# Patient Record
Sex: Female | Born: 1955
Health system: Southern US, Community
[De-identification: ages and names within clinical notes are randomized; demographics above are authoritative.]

## PROBLEM LIST (undated history)

## (undated) DIAGNOSIS — M1990 Unspecified osteoarthritis, unspecified site: Secondary | ICD-10-CM

## (undated) DIAGNOSIS — F419 Anxiety disorder, unspecified: Secondary | ICD-10-CM

## (undated) DIAGNOSIS — K5792 Diverticulitis of intestine, part unspecified, without perforation or abscess without bleeding: Secondary | ICD-10-CM

## (undated) DIAGNOSIS — E079 Disorder of thyroid, unspecified: Secondary | ICD-10-CM

## (undated) DIAGNOSIS — I10 Essential (primary) hypertension: Secondary | ICD-10-CM

## (undated) DIAGNOSIS — T7840XA Allergy, unspecified, initial encounter: Secondary | ICD-10-CM

## (undated) DIAGNOSIS — E7800 Pure hypercholesterolemia, unspecified: Secondary | ICD-10-CM

## (undated) DIAGNOSIS — Z87891 Personal history of nicotine dependence: Secondary | ICD-10-CM

## (undated) DIAGNOSIS — E669 Obesity, unspecified: Secondary | ICD-10-CM

## (undated) HISTORY — DX: Diverticulitis of intestine, part unspecified, without perforation or abscess without bleeding: K57.92

## (undated) HISTORY — DX: Pure hypercholesterolemia, unspecified: E78.00

## (undated) HISTORY — DX: Obesity, unspecified: E66.9

## (undated) HISTORY — DX: Unspecified osteoarthritis, unspecified site: M19.90

## (undated) HISTORY — DX: Personal history of nicotine dependence: Z87.891

## (undated) HISTORY — PX: WISDOM TOOTH EXTRACTION: SHX21

## (undated) HISTORY — DX: Allergy, unspecified, initial encounter: T78.40XA

## (undated) HISTORY — PX: COLONOSCOPY: SHX174

## (undated) HISTORY — DX: Disorder of thyroid, unspecified: E07.9

## (undated) HISTORY — DX: Essential (primary) hypertension: I10

---

## 2001-01-20 ENCOUNTER — Encounter: Admission: RE | Admit: 2001-01-20 | Discharge: 2001-01-20 | Payer: Self-pay | Admitting: *Deleted

## 2001-01-20 ENCOUNTER — Encounter: Payer: Self-pay | Admitting: *Deleted

## 2001-05-18 ENCOUNTER — Other Ambulatory Visit: Admission: RE | Admit: 2001-05-18 | Discharge: 2001-05-18 | Payer: Self-pay | Admitting: *Deleted

## 2002-05-24 ENCOUNTER — Other Ambulatory Visit: Admission: RE | Admit: 2002-05-24 | Discharge: 2002-05-24 | Payer: Self-pay | Admitting: *Deleted

## 2003-05-27 ENCOUNTER — Encounter: Admission: RE | Admit: 2003-05-27 | Discharge: 2003-05-27 | Payer: Self-pay

## 2004-08-07 ENCOUNTER — Other Ambulatory Visit: Admission: RE | Admit: 2004-08-07 | Discharge: 2004-08-07 | Payer: Self-pay | Admitting: Family Medicine

## 2005-08-13 ENCOUNTER — Other Ambulatory Visit: Admission: RE | Admit: 2005-08-13 | Discharge: 2005-08-13 | Payer: Self-pay | Admitting: Family Medicine

## 2006-04-07 ENCOUNTER — Encounter: Admission: RE | Admit: 2006-04-07 | Discharge: 2006-04-07 | Payer: Self-pay | Admitting: Family Medicine

## 2006-06-05 ENCOUNTER — Ambulatory Visit: Payer: Self-pay | Admitting: Family Medicine

## 2006-06-30 ENCOUNTER — Ambulatory Visit: Payer: Self-pay | Admitting: Gastroenterology

## 2006-07-14 ENCOUNTER — Ambulatory Visit: Payer: Self-pay | Admitting: Gastroenterology

## 2006-07-14 ENCOUNTER — Encounter: Payer: Self-pay | Admitting: Gastroenterology

## 2006-08-14 ENCOUNTER — Ambulatory Visit: Payer: Self-pay | Admitting: Family Medicine

## 2006-08-20 ENCOUNTER — Ambulatory Visit: Payer: Self-pay | Admitting: Family Medicine

## 2006-12-30 ENCOUNTER — Ambulatory Visit: Payer: Self-pay | Admitting: Gastroenterology

## 2007-01-28 ENCOUNTER — Ambulatory Visit: Payer: Self-pay | Admitting: Family Medicine

## 2007-02-03 ENCOUNTER — Ambulatory Visit: Payer: Self-pay | Admitting: Gastroenterology

## 2007-02-03 ENCOUNTER — Encounter (INDEPENDENT_AMBULATORY_CARE_PROVIDER_SITE_OTHER): Payer: Self-pay | Admitting: Specialist

## 2007-02-03 LAB — HM COLONOSCOPY: HM Colonoscopy: NORMAL

## 2007-09-22 ENCOUNTER — Ambulatory Visit: Payer: Self-pay | Admitting: Family Medicine

## 2008-05-23 LAB — HM PAP SMEAR: HM Pap smear: NORMAL

## 2008-06-08 ENCOUNTER — Ambulatory Visit: Payer: Self-pay | Admitting: Family Medicine

## 2008-07-07 ENCOUNTER — Ambulatory Visit: Payer: Self-pay | Admitting: Family Medicine

## 2008-07-12 ENCOUNTER — Ambulatory Visit: Payer: Self-pay | Admitting: Family Medicine

## 2008-07-14 ENCOUNTER — Ambulatory Visit: Payer: Self-pay | Admitting: Family Medicine

## 2008-08-15 ENCOUNTER — Ambulatory Visit: Payer: Self-pay | Admitting: Family Medicine

## 2008-08-17 ENCOUNTER — Ambulatory Visit: Payer: Self-pay | Admitting: Family Medicine

## 2008-08-19 ENCOUNTER — Ambulatory Visit: Payer: Self-pay | Admitting: Family Medicine

## 2008-10-03 ENCOUNTER — Encounter: Payer: Self-pay | Admitting: Family Medicine

## 2008-10-03 ENCOUNTER — Ambulatory Visit: Payer: Self-pay | Admitting: Family Medicine

## 2008-10-03 ENCOUNTER — Other Ambulatory Visit: Admission: RE | Admit: 2008-10-03 | Discharge: 2008-10-03 | Payer: Self-pay | Admitting: Family Medicine

## 2008-10-07 ENCOUNTER — Ambulatory Visit: Payer: Self-pay | Admitting: Family Medicine

## 2009-02-20 ENCOUNTER — Ambulatory Visit: Payer: Self-pay | Admitting: Family Medicine

## 2009-06-23 ENCOUNTER — Ambulatory Visit: Payer: Self-pay | Admitting: Family Medicine

## 2009-06-26 ENCOUNTER — Ambulatory Visit: Payer: Self-pay | Admitting: Family Medicine

## 2009-10-10 ENCOUNTER — Ambulatory Visit: Payer: Self-pay | Admitting: Family Medicine

## 2010-04-19 ENCOUNTER — Ambulatory Visit: Payer: Self-pay | Admitting: Family Medicine

## 2010-10-24 ENCOUNTER — Ambulatory Visit: Payer: Self-pay | Admitting: Family Medicine

## 2010-10-24 DIAGNOSIS — Z0000 Encounter for general adult medical examination without abnormal findings: Secondary | ICD-10-CM

## 2010-10-24 DIAGNOSIS — M1990 Unspecified osteoarthritis, unspecified site: Secondary | ICD-10-CM

## 2010-10-24 DIAGNOSIS — L719 Rosacea, unspecified: Secondary | ICD-10-CM

## 2010-10-24 DIAGNOSIS — E785 Hyperlipidemia, unspecified: Secondary | ICD-10-CM

## 2010-10-24 DIAGNOSIS — E039 Hypothyroidism, unspecified: Secondary | ICD-10-CM

## 2010-10-24 DIAGNOSIS — J309 Allergic rhinitis, unspecified: Secondary | ICD-10-CM

## 2010-11-18 HISTORY — PX: KNEE ARTHROSCOPY: SHX127

## 2011-04-16 LAB — HM MAMMOGRAPHY: HM Mammogram: NEGATIVE

## 2011-05-23 ENCOUNTER — Encounter: Payer: Self-pay | Admitting: Family Medicine

## 2011-07-31 ENCOUNTER — Encounter: Payer: Self-pay | Admitting: Family Medicine

## 2011-07-31 ENCOUNTER — Ambulatory Visit (INDEPENDENT_AMBULATORY_CARE_PROVIDER_SITE_OTHER): Payer: BC Managed Care – PPO | Admitting: Family Medicine

## 2011-07-31 DIAGNOSIS — Z23 Encounter for immunization: Secondary | ICD-10-CM

## 2011-07-31 DIAGNOSIS — R079 Chest pain, unspecified: Secondary | ICD-10-CM

## 2011-07-31 NOTE — Progress Notes (Signed)
  Subjective:    Patient ID: Paula Cline, female    DOB: November 04, 1956, 55 y.o.   MRN: [id number]  HPI She woke up this morning with a light pressure sensation underneath her left breast that has been intermittent. No shortness of breath, nausea, diaphoresis, weakness ,DOE. She did not eat breakfast. She does not smoke. Family history significant for grandfather having an MI in his early 8s. She is now involved in an exercise program and has been doing this for 2 and half months with no difficulty   Review of Systems     Objective:   Physical Exam alert and in no distress. Tympanic membranes and canals are normal. Throat is clear. Tonsils are normal. Neck is supple without adenopathy or thyromegaly. Cardiac exam shows a regular sinus rhythm without murmurs or gallops. Lungs are clear to auscultation. EKG shows no acute changes        Assessment & Plan:   1. Chest pain  PR ELECTROCARDIOGRAM, COMPLETE, CBC with Differential, Comp Met (CMET), Lipid panel   Continue with exercise program and call if any concerns about chest pain or shortness of breath

## 2011-07-31 NOTE — Patient Instructions (Signed)
Continue with your exercise program and if you run into difficulty with chest pain, weakness or sweating with exercise let me knowhest pain shortness of breath

## 2011-08-01 LAB — COMPREHENSIVE METABOLIC PANEL
AST: 18 U/L (ref 0–37)
Albumin: 4.2 g/dL (ref 3.5–5.2)
Alkaline Phosphatase: 72 U/L (ref 39–117)
BUN: 21 mg/dL (ref 6–23)
Potassium: 4.4 mEq/L (ref 3.5–5.3)
Sodium: 138 mEq/L (ref 135–145)

## 2011-08-01 LAB — CBC WITH DIFFERENTIAL/PLATELET
Basophils Absolute: 0.1 10*3/uL (ref 0.0–0.1)
Basophils Relative: 1 % (ref 0–1)
MCHC: 31.9 g/dL (ref 30.0–36.0)
Monocytes Absolute: 0.4 10*3/uL (ref 0.1–1.0)
Neutro Abs: 5.9 10*3/uL (ref 1.7–7.7)
Neutrophils Relative %: 69 % (ref 43–77)
RDW: 14.4 % (ref 11.5–15.5)

## 2011-08-01 LAB — LIPID PANEL
Cholesterol: 229 mg/dL — ABNORMAL HIGH (ref 0–200)
HDL: 53 mg/dL
LDL Cholesterol: 160 mg/dL — ABNORMAL HIGH (ref 0–99)
Total CHOL/HDL Ratio: 4.3 ratio
Triglycerides: 82 mg/dL
VLDL: 16 mg/dL (ref 0–40)

## 2011-08-05 ENCOUNTER — Telehealth: Payer: Self-pay

## 2011-08-05 NOTE — Telephone Encounter (Signed)
Pt informed of labs and mailed diet info 

## 2011-10-28 ENCOUNTER — Other Ambulatory Visit (HOSPITAL_COMMUNITY)
Admission: RE | Admit: 2011-10-28 | Discharge: 2011-10-28 | Disposition: A | Discharge status: Home / Self Care | Payer: BC Managed Care – PPO | Source: Ambulatory Visit | Attending: Family Medicine | Admitting: Family Medicine

## 2011-10-28 ENCOUNTER — Encounter: Payer: Self-pay | Admitting: Family Medicine

## 2011-10-28 ENCOUNTER — Ambulatory Visit (INDEPENDENT_AMBULATORY_CARE_PROVIDER_SITE_OTHER): Payer: BC Managed Care – PPO | Admitting: Family Medicine

## 2011-10-28 DIAGNOSIS — M1990 Unspecified osteoarthritis, unspecified site: Secondary | ICD-10-CM

## 2011-10-28 DIAGNOSIS — M129 Arthropathy, unspecified: Secondary | ICD-10-CM

## 2011-10-28 DIAGNOSIS — Z79899 Other long term (current) drug therapy: Secondary | ICD-10-CM

## 2011-10-28 DIAGNOSIS — Z0000 Encounter for general adult medical examination without abnormal findings: Secondary | ICD-10-CM

## 2011-10-28 DIAGNOSIS — Z01419 Encounter for gynecological examination (general) (routine) without abnormal findings: Secondary | ICD-10-CM | POA: Insufficient documentation

## 2011-10-28 DIAGNOSIS — E669 Obesity, unspecified: Secondary | ICD-10-CM | POA: Insufficient documentation

## 2011-10-28 DIAGNOSIS — E039 Hypothyroidism, unspecified: Secondary | ICD-10-CM

## 2011-10-28 DIAGNOSIS — J309 Allergic rhinitis, unspecified: Secondary | ICD-10-CM

## 2011-10-28 DIAGNOSIS — E785 Hyperlipidemia, unspecified: Secondary | ICD-10-CM

## 2011-10-28 LAB — POCT URINALYSIS DIPSTICK
Bilirubin, UA: NEGATIVE
Glucose, UA: NEGATIVE
Ketones, UA: NEGATIVE
Leukocytes, UA: NEGATIVE
Protein, UA: NEGATIVE
Spec Grav, UA: 1.015

## 2011-10-28 MED ORDER — METRONIDAZOLE 1 % EX GEL: 1.0000 "application " | Freq: Every day | CUTANEOUS | Status: DC

## 2011-10-28 MED ORDER — DOXYCYCLINE HYCLATE 100 MG PO CPEP: 100.0000 mg | ORAL_CAPSULE | Freq: Two times a day (BID) | ORAL | Status: DC

## 2011-10-28 NOTE — Patient Instructions (Signed)
Continue with your active lifestyle as well a exercises. Also make some changes in your eating habits in regard to carbohydrates. Return here if you need a little tuneup.

## 2011-10-28 NOTE — Progress Notes (Signed)
  Subjective:    Patient ID: Paula Cline, female    DOB: 25-Apr-1956, 55 y.o.   MRN: [id number]  HPI She is here for a complete examination. She recently had knee surgery and is now involved a water aerobics program to help with her weight reduction. She continues on her thyroid medication. She does have underlying allergies however these are under good control. She does recognize the need to make weight reduction a priority in her life.   Review of Systems Negative except as above    Objective:   Physical Exam BP 130/88  Pulse 77  Ht 5' 2.5" (1.588 m)  Wt 260 lb (117.935 kg)  BMI 46.80 kg/m2  General Appearance:    Alert, cooperative, no distress, appears stated age  Head:    Normocephalic, without obvious abnormality, atraumatic  Eyes:    PERRL, conjunctiva/corneas clear, EOM's intact, fundi    benign  Ears:    Normal TM's and external ear canals  Nose:   Nares normal, mucosa normal, no drainage or sinus   tenderness  Throat:   Lips, mucosa, and tongue normal; teeth and gums normal  Neck:   Supple, no lymphadenopathy;  thyroid:  no   enlargement/tenderness/nodules; no carotid   bruit or JVD  Back:    Spine nontender, no curvature, ROM normal, no CVA     tenderness  Lungs:     Clear to auscultation bilaterally without wheezes, rales or     ronchi; respirations unlabored  Chest Wall:    No tenderness or deformity   Heart:    Regular rate and rhythm, S1 and S2 normal, no murmur, rub   or gallop  Breast Exam:    No tenderness, masses, or nipple discharge or inversion.      No axillary lymphadenopathy  Abdomen:     Soft, non-tender, nondistended, normoactive bowel sounds,    no masses, no hepatosplenomegaly  Genitalia:    Normal external genitalia without lesions.  BUS and vagina normal; cervix without lesions, or cervical motion tenderness. No abnormal vaginal discharge.  Uterus and adnexa not enlarged, nontender, no masses.  Pap performed  Rectal:    Normal tone, no masses or  tenderness; guaiac negative stool  Extremities:   No clubbing, cyanosis or edema  Pulses:   2+ and symmetric all extremities  Skin:   Skin color, texture, turgor normal, no rashes or lesions  Lymph nodes:   Cervical, supraclavicular, and axillary nodes normal  Neurologic:   CNII-XII intact, normal strength, sensation and gait; reflexes 2+ and symmetric throughout          Psych:   Normal mood, affect, hygiene and grooming.          Assessment & Plan:   1. Routine general medical examination at a health care facility  POCT Urinalysis Dipstick, POCT Hemoccult (POC) Blood/Stool Test, Cytology - PAP  2. Hypothyroid  TSH  3. Obesity, Class III, BMI 40-49.9 (morbid obesity)    4. Allergic rhinitis, mild    5. Arthritis    6. Hyperlipidemia LDL goal < 100    7. Encounter for long-term (current) use of other medications  TSH   encouraged her to continue with her as an exercise program. Also discussed the need for her to cut down especially on carbohydrates. Appropriate blood work ordered.

## 2011-10-29 LAB — TSH: TSH: 10.727 u[IU]/mL — ABNORMAL HIGH (ref 0.350–4.500)

## 2011-10-30 ENCOUNTER — Encounter: Payer: Self-pay | Admitting: Family Medicine

## 2011-10-30 DIAGNOSIS — L719 Rosacea, unspecified: Secondary | ICD-10-CM | POA: Insufficient documentation

## 2011-10-30 NOTE — Progress Notes (Signed)
  Subjective:    Patient ID: Paula Cline, female    DOB: 07/28/1956, 55 y.o.   MRN: [id number]  HPI She is here for a complete examination. She has attempted to lose weight in the past however has had difficulty with this. She does have underlying allergies however she is not having much difficulty with them. She continues on medication for her as a show which is working quite well. Her arthritis does usually bother her however she does not take Naprosyn very often. She is not involved in a relationship. She did quit smoking several years ago.   Review of Systems Negative except as above    Objective:   Physical Exam There were no vitals taken for this visit.  General Appearance:    Alert, cooperative, no distress, appears stated age  Head:    Normocephalic, without obvious abnormality, atraumatic  Eyes:    PERRL, conjunctiva/corneas clear, EOM's intact, fundi    benign  Ears:    Normal TM's and external ear canals  Nose:   Nares normal, mucosa normal, no drainage or sinus   tenderness  Throat:   Lips, mucosa, and tongue normal; teeth and gums normal  Neck:   Supple, no lymphadenopathy;  thyroid:  no   enlargement/tenderness/nodules; no carotid   bruit or JVD  Back:    Spine nontender, no curvature, ROM normal, no CVA     tenderness  Lungs:     Clear to auscultation bilaterally without wheezes, rales or     ronchi; respirations unlabored  Chest Wall:    No tenderness or deformity   Heart:    Regular rate and rhythm, S1 and S2 normal, no murmur, rub   or gallop  Breast Exam:    No tenderness, masses, or nipple discharge or inversion.      No axillary lymphadenopathy  Abdomen:     Soft, non-tender, nondistended, normoactive bowel sounds,    no masses, no hepatosplenomegaly  Genitalia:    Normal external genitalia without lesions.  BUS and vagina normal; cervix without lesions, or cervical motion tenderness. No abnormal vaginal discharge.  Uterus and adnexa not enlarged, nontender, no  masses.  Pap performed  Rectal:    Normal tone, no masses or tenderness; guaiac negative stool  Extremities:   No clubbing, cyanosis or edema  Pulses:   2+ and symmetric all extremities  Skin:   Skin color, texture, turgor normal, no rashes or lesions  Lymph nodes:   Cervical, supraclavicular, and axillary nodes normal  Neurologic:   CNII-XII intact, normal strength, sensation and gait; reflexes 2+ and symmetric throughout          Psych:   Normal mood, affect, hygiene and grooming.          Assessment & Plan:   1. Allergic rhinitis, mild   2. Arthritis   3. Hyperlipidemia LDL goal < 100   4. Hypothyroid   5. Obesity, Class III, BMI 40-49.9 (morbid obesity)   6. Rosacea, acne    Her medications were renewed. Discussed diet and exercise with her in detail.

## 2011-10-31 ENCOUNTER — Telehealth: Payer: Self-pay | Admitting: Family Medicine

## 2011-10-31 NOTE — Telephone Encounter (Signed)
DONE

## 2011-12-19 ENCOUNTER — Other Ambulatory Visit: Payer: BC Managed Care – PPO

## 2011-12-19 DIAGNOSIS — R7989 Other specified abnormal findings of blood chemistry: Secondary | ICD-10-CM

## 2011-12-19 LAB — TSH: TSH: 3.579 u[IU]/mL (ref 0.350–4.500)

## 2012-03-16 ENCOUNTER — Other Ambulatory Visit: Payer: Self-pay | Admitting: Family Medicine

## 2012-09-17 ENCOUNTER — Encounter: Payer: Self-pay | Admitting: Family Medicine

## 2012-09-18 ENCOUNTER — Encounter: Payer: Self-pay | Admitting: Internal Medicine

## 2012-10-05 ENCOUNTER — Other Ambulatory Visit (INDEPENDENT_AMBULATORY_CARE_PROVIDER_SITE_OTHER): Payer: BC Managed Care – PPO

## 2012-10-05 DIAGNOSIS — Z23 Encounter for immunization: Secondary | ICD-10-CM

## 2012-10-22 ENCOUNTER — Encounter: Payer: Self-pay | Admitting: Internal Medicine

## 2012-11-05 ENCOUNTER — Ambulatory Visit (INDEPENDENT_AMBULATORY_CARE_PROVIDER_SITE_OTHER): Payer: BC Managed Care – PPO | Admitting: Family Medicine

## 2012-11-05 ENCOUNTER — Encounter: Payer: Self-pay | Admitting: Family Medicine

## 2012-11-05 VITALS — BP 130/80 | HR 76 | Ht 62.5 in | Wt 255.0 lb

## 2012-11-05 DIAGNOSIS — M1990 Unspecified osteoarthritis, unspecified site: Secondary | ICD-10-CM

## 2012-11-05 DIAGNOSIS — M129 Arthropathy, unspecified: Secondary | ICD-10-CM

## 2012-11-05 DIAGNOSIS — J309 Allergic rhinitis, unspecified: Secondary | ICD-10-CM

## 2012-11-05 DIAGNOSIS — E785 Hyperlipidemia, unspecified: Secondary | ICD-10-CM

## 2012-11-05 DIAGNOSIS — Z0000 Encounter for general adult medical examination without abnormal findings: Secondary | ICD-10-CM

## 2012-11-05 DIAGNOSIS — L719 Rosacea, unspecified: Secondary | ICD-10-CM

## 2012-11-05 DIAGNOSIS — E039 Hypothyroidism, unspecified: Secondary | ICD-10-CM

## 2012-11-05 LAB — COMPREHENSIVE METABOLIC PANEL
Alkaline Phosphatase: 78 U/L (ref 39–117)
BUN: 19 mg/dL (ref 6–23)
CO2: 26 mEq/L (ref 19–32)
Glucose, Bld: 96 mg/dL (ref 70–99)
Sodium: 139 mEq/L (ref 135–145)
Total Bilirubin: 0.5 mg/dL (ref 0.3–1.2)
Total Protein: 7 g/dL (ref 6.0–8.3)

## 2012-11-05 LAB — POCT URINALYSIS DIPSTICK
Glucose, UA: NEGATIVE
Leukocytes, UA: NEGATIVE
Nitrite, UA: NEGATIVE
Protein, UA: NEGATIVE
Spec Grav, UA: 1.02
Urobilinogen, UA: NEGATIVE

## 2012-11-05 LAB — CBC WITH DIFFERENTIAL/PLATELET
Basophils Relative: 1 % (ref 0–1)
Eosinophils Absolute: 0.2 10*3/uL (ref 0.0–0.7)
Eosinophils Relative: 2 % (ref 0–5)
HCT: 42.4 % (ref 36.0–46.0)
Hemoglobin: 14.2 g/dL (ref 12.0–15.0)
Lymphs Abs: 2.5 10*3/uL (ref 0.7–4.0)
MCH: 28.9 pg (ref 26.0–34.0)
MCHC: 33.5 g/dL (ref 30.0–36.0)
MCV: 86.2 fL (ref 78.0–100.0)
Monocytes Absolute: 0.5 10*3/uL (ref 0.1–1.0)
Monocytes Relative: 5 % (ref 3–12)
Neutrophils Relative %: 64 % (ref 43–77)
RBC: 4.92 MIL/uL (ref 3.87–5.11)

## 2012-11-05 LAB — LIPID PANEL
Cholesterol: 216 mg/dL — ABNORMAL HIGH (ref 0–200)
Triglycerides: 95 mg/dL (ref <150)
VLDL: 19 mg/dL (ref 0–40)

## 2012-11-05 LAB — TSH: TSH: 1.335 u[IU]/mL (ref 0.350–4.500)

## 2012-11-05 NOTE — Patient Instructions (Signed)
Paula Cline [phone] 

## 2012-11-05 NOTE — Progress Notes (Signed)
  Subjective:    Patient ID: Paula Cline, female    DOB: Mar 23, 1956, 56 y.o.   MRN: [id number]  HPI She is here for a medication check. She continues on MetroGel for her rosacea and notes that it does worsen with alcohol consumption. She does complain of arthritis especially in the knees and back. Her allergies seem to be under good control. She admits to having weight control issues and blames this  on stress-related heating. She has had difficulty with depression in the past and did get counseling which did help. She is considering getting involved in Weight [employer]. She did get nutrition counseling several years ago.   Review of Systems     Objective:   Physical Exam alert and in no distress. Tympanic membranes and canals are normal. Throat is clear. Tonsils are normal. Neck is supple without adenopathy or thyromegaly. Cardiac exam shows a regular sinus rhythm without murmurs or gallops. Lungs are clear to auscultation. DTRs are normal. Exam of her face shows no erythema or dryness.      Assessment & Plan:   1. Routine general medical examination at a health care facility  POCT Urinalysis Dipstick  2. Obesity, Class III, BMI 40-49.9 (morbid obesity)    3. Hypothyroid    4. Rosacea, acne    5. Hyperlipidemia LDL goal < 100    6. Arthritis    7. Allergic rhinitis, mild     I will do routine blood screening on her. Also recommend she get back involved in counseling to help deal with stress-related eating. At this time she is not interested in going back to the nutritionist.

## 2012-11-06 ENCOUNTER — Encounter: Payer: BC Managed Care – PPO | Admitting: Family Medicine

## 2012-11-09 NOTE — Progress Notes (Signed)
Quick Note:  Called pt cell informed pt and she verbalized understanding ______

## 2012-12-18 ENCOUNTER — Other Ambulatory Visit: Payer: Self-pay | Admitting: Family Medicine

## 2012-12-18 ENCOUNTER — Telehealth: Payer: Self-pay | Admitting: Family Medicine

## 2012-12-18 NOTE — Telephone Encounter (Signed)
DONE ERROR

## 2013-02-09 ENCOUNTER — Telehealth: Payer: Self-pay | Admitting: Family Medicine

## 2013-02-09 NOTE — Telephone Encounter (Signed)
lm

## 2013-03-17 ENCOUNTER — Other Ambulatory Visit: Payer: Self-pay | Admitting: Family Medicine

## 2013-04-03 ENCOUNTER — Other Ambulatory Visit: Payer: Self-pay | Admitting: Family Medicine

## 2013-07-15 ENCOUNTER — Telehealth: Payer: Self-pay | Admitting: Family Medicine

## 2013-07-15 NOTE — Telephone Encounter (Signed)
Is this okay to do nutrition referral for this patient? Please advise.

## 2013-07-16 ENCOUNTER — Other Ambulatory Visit: Payer: Self-pay | Admitting: Family Medicine

## 2013-07-16 NOTE — Telephone Encounter (Signed)
Referral is in the system. CLS

## 2013-07-16 NOTE — Telephone Encounter (Signed)
Give her the referral 

## 2013-08-13 ENCOUNTER — Encounter: Payer: Self-pay | Admitting: Family Medicine

## 2013-08-13 ENCOUNTER — Ambulatory Visit (INDEPENDENT_AMBULATORY_CARE_PROVIDER_SITE_OTHER): Payer: BC Managed Care – PPO | Admitting: Family Medicine

## 2013-08-13 VITALS — BP 140/84 | HR 72 | Ht 62.0 in | Wt 251.0 lb

## 2013-08-13 DIAGNOSIS — B009 Herpesviral infection, unspecified: Secondary | ICD-10-CM

## 2013-08-13 DIAGNOSIS — M25562 Pain in left knee: Secondary | ICD-10-CM

## 2013-08-13 DIAGNOSIS — Z23 Encounter for immunization: Secondary | ICD-10-CM

## 2013-08-13 DIAGNOSIS — B001 Herpesviral vesicular dermatitis: Secondary | ICD-10-CM | POA: Insufficient documentation

## 2013-08-13 DIAGNOSIS — M25569 Pain in unspecified knee: Secondary | ICD-10-CM

## 2013-08-13 MED ORDER — VALACYCLOVIR HCL 1 G PO TABS: ORAL_TABLET | ORAL | Status: DC

## 2013-08-13 NOTE — Patient Instructions (Addendum)
Use the naproxen twice daily with food for 7-10 days until pain has resolved.  Use ice if swelling after work.  You can also try heat, and/or alternating (use whichever works best).  You can continue riding the bike--back off on the tension if having any discomfort, and ensure that leg is almost fully extending (proper seat position).  If an activity makes your discomfort worse, then hold off for longer before restarting.   Baker's Cyst A Baker's cyst is a swelling that forms in the back of the knee. It is a sac-like structure. It is filled with the same fluid that is located in your knee. The fluid located in your knee is necessary because it lubricates the bones and cartilage. It allows them to move over each other more easily. CAUSES  When the knee becomes injured or has soreness (inflammation) present, more fluid forms in the knee. When this happens, the joint lining is pushed out behind the knee and forms the baker's cyst. This cyst may also be caused by inflammation from arthritic conditions and infections. DIAGNOSIS  A Baker's cyst is most often diagnosed with an ultrasound. This is a specialized picture (like an X-ray). It shows a picture by using sound waves. Sometimes a specialized x-ray called an MRI (magnetic resonance imaging) is used. This picks up other problems within a joint if an ultrasound alone cannot make the diagnosis. If the cyst came immediately following an injury, plain x-rays may be used to make a diagnosis. TREATMENT  The treatment depends on the cause of the cyst. But most of these cysts are caused by an inflammation. Anti-inflammatory medications and rest often will get rid of the problem. If the cyst is caused by an infection, medications (antibiotics) will be prescribed to help this. Take the medications as directed. Refer to Home Care Instructions, below, for additional treatment suggestions. HOME CARE INSTRUCTIONS   If the cyst was caused by an injury, for the first 24  hours, while lying down, keep the injured extremity elevated on 2 pillows.  For the first 24 hours while you are awake, apply ice bags (ice in a plastic bag with a towel around it to prevent frostbite to skin) 3-4 times per day for 15-20 minutes to the injured area. Then do as directed by your caregiver.  Only take over-the-counter or prescription medicines for pain, discomfort, or fever as directed by your caregiver. Persistent pain and inability to use the injured area for more than 2 to 3 days are warning signs indicating that you should see a caregiver for a follow-up visit as soon as possible. Persistent pain and swelling indicate that further evaluation, non-weight bearing (use of crutches as instructed), and/or further x-rays are needed. Make a follow-up appointment with your own caregiver. If conservative measures (rest, medications and inactivity) do not help the problem get better, sometimes surgery for removal of the cyst is needed. Reasons for this may be that the cyst is pressing on nerves and/or vessels and causing problems which cannot wait for improvement with conservative treatment. If the problem is caused by injuries to the cartilage in the knee, surgery is often needed for treatment of that problem. MAKE SURE YOU:   Understand these instructions.  Will watch your condition.  Will get help right away if you are not doing well or get worse. Document Released: 11/04/2005 Document Revised: 01/27/2012 Document Reviewed: 06/22/2008 [facility] Patient Information 2014 [place], [place].

## 2013-08-13 NOTE — Progress Notes (Signed)
Chief Complaint  Patient presents with  . Leg Pain    left leg pain, has been having pain behind her left knee(this is her good knee) had arthroscopic done on right knee in 2012. Heard "pop" last night and 2 more this morning.  Would like to know if you could call in a refill on her valtrex.    Patient has arthritis in her knees and neck.  She takes naproxen as needed for arthritis pain, less than twice per week.  She has been riding recumbent bike, she cleans houses for a living.  2 weeks ago she worked, rode bike, and then moved a lot of boxes, and woke up with swelling behind her left knee. She has a h/o Baker's cyst.  She rested, kept it elevated, had been doing better, until last night when she felt tightness in back of left knee.  When she extended the knee, she felt a pop.  Has some soreness/pain in posterior L knee.  She had two small pops this morning, just slight discomfort.  She hasn't taken any naproxen since last week.  Denies locking or giving way of knee.  She is trying hard to lose weight.  She has been exercising regularly.  Seeing psychologist re: to find out why she eats.  She is asking for refill on Valtrex; she is going to the beach and sometimes gets fever blisters from the sun.  Past Medical History  Diagnosis Date  . Arthritis   . Allergy     RHINITIS  . Obesity   . Diverticulitis   . Thyroid disease   . Hypertension   . Hypercholesteremia   . Former smoker    Past Surgical History  Procedure Laterality Date  . Knee arthroscopy  2012    Right knee  . Colonoscopy  2007/2008    Dr. Christella Hartigan   History   Social History  . Marital Status: Single    Spouse Name: N/A    Number of Children: N/A  . Years of Education: N/A   Occupational History  . Not on file.   Social History Main Topics  . Smoking status: Former Smoker    Types: Cigarettes    Quit date: 05/22/1993  . Smokeless tobacco: Never Used  . Alcohol Use: Yes     Comment: rare  . Drug Use: No  .  Sexual Activity: Not Currently   Other Topics Concern  . Not on file   Social History Narrative  . No narrative on file    Current outpatient prescriptions:naproxen (NAPROSYN) 500 MG tablet, TAKE 1 TABLET BY MOUTH TWICE A DAY, Disp: 60 tablet, Rfl: 1;  SYNTHROID 125 MCG tablet, TAKE 1 TABLET BY MOUTH ONCE DAILY, Disp: 90 tablet, Rfl: PRN;  valACYclovir (VALTREX) 1000 MG tablet, TAKE 2 TABLETS BY MOUTH TWICE A DAY FOR ONE DAY , THEN AS DIRECTED BY YOUR DOCTOR, Disp: 10 tablet, Rfl: PRN  Allergies  Allergen Reactions  . Hydrocodone Itching   ROS: denies fevers, URI symptoms, headache, chest pain, shortness of breath, bleeding/bruising, GI complaints.  +knee pain.  See HPI  PHYSICAL EXAM: BP 140/84  Pulse 72  Ht 5\' 2"  (1.575 m)  Wt 251 lb (113.853 kg)  BMI 45.9 kg/m2  Pleasant, talkative obese female in no distress L knee: No erythema, warmth or significant swelling noted.  Mild tenderness on deep palpation of popliteal fossa and over distal hamstring tendons.  No pain with hamstring stretch. Had pain with hamstring strength testing.  Strength was  intact Some crepitus noted at L knee.  FROM.  Negative Lachman, no pain with varus/valgus stress  ASSESSMENT/PLAN: Left knee pain - suspect mild hamstring strain, plus/minus small Baker's Cyst.  Treat with Naproxen  Need for prophylactic vaccination and inoculation against influenza - Plan: Flu Vaccine QUAD 36+ mos IM  Herpes labialis - Plan: valACYclovir (VALTREX) 1000 MG tablet   Hamstring strain, +/- baker's cyst Treat with naproxen--she already has.  Advised to use it twice daily with food for 7-10 days, if needed. Precautions reviewed. Discussed proper seat position for bike.  She may resume recumbent bike, turn down the tension if causes discomfort  F/u with Dr. Susann Givens for CPE in December, sooner prn

## 2013-09-27 ENCOUNTER — Encounter: Payer: Self-pay | Admitting: Family Medicine

## 2013-09-27 ENCOUNTER — Ambulatory Visit
Admission: RE | Admit: 2013-09-27 | Discharge: 2013-09-27 | Disposition: A | Discharge status: Home / Self Care | Payer: BC Managed Care – PPO | Source: Ambulatory Visit | Attending: Family Medicine | Admitting: Family Medicine

## 2013-09-27 ENCOUNTER — Ambulatory Visit (INDEPENDENT_AMBULATORY_CARE_PROVIDER_SITE_OTHER): Payer: BC Managed Care – PPO | Admitting: Family Medicine

## 2013-09-27 VITALS — BP 124/82 | HR 70 | Wt 242.0 lb

## 2013-09-27 DIAGNOSIS — M25562 Pain in left knee: Secondary | ICD-10-CM

## 2013-09-27 DIAGNOSIS — M25569 Pain in unspecified knee: Secondary | ICD-10-CM

## 2013-09-27 NOTE — Progress Notes (Signed)
  Subjective:    Patient ID: Paula Cline, female    DOB: 1956/04/17, 57 y.o.   MRN: [id number]  HPI She is here for continued difficulty with knee pain.. If she stands for long periods of time or kneels on her knee, she will have pain. She does feel some stiffness but no popping locking or grinding.   Review of Systems     Objective:   Physical Exam Exam of left knee shows questionable effusion. There is some slight joint line tenderness. Negative anterior drawer. Ligaments intact. McMurray's testing negative. Fullness is noted in the popliteal fossa Straight does show degenerative changes and questionable loose body.       Assessment & Plan:  Left knee pain - Plan: DG Knee Complete 4 Views Left  Obesity, Class III, BMI 40-49.9 (morbid obesity)  I will refer her to orthopedics for followup on this especially in lieu of the loose body.

## 2013-09-30 ENCOUNTER — Telehealth: Payer: Self-pay | Admitting: Internal Medicine

## 2013-09-30 NOTE — Telephone Encounter (Signed)
i have called [facility] and got her scheduled with Dr. Rennis Chris on 11/18 @ 2:15 but pt does not want to see him so i advise pt to call them and reschedule it

## 2013-09-30 NOTE — Telephone Encounter (Signed)
Let her know that the x-ray showed loose bodies and degenerative changes and I want her to see an orthopedic surgeon

## 2013-09-30 NOTE — Telephone Encounter (Signed)
Pt states she has not heard about her xrays and wanted to know the results

## 2013-10-01 ENCOUNTER — Telehealth: Payer: Self-pay

## 2013-10-01 NOTE — Telephone Encounter (Signed)
CALLED TO SEE IF WE COULD GET A DIFFERENT DR. TO SEE HER FOR HER KNEE CALLED [facility] HAVE BEEN ON HOLD 3 TIMES FOR 5 MIN THEN IT WILL HANG UP ON ME / TALKED WITH PAM AND PT HAD CANCELED APPOINTMENT WITH [facility] I HAVE GOT HER AN APPOINTMENT WITH DR.NORRIS NOV 18 AT 8 AM I HAVE FAXED ALL INFORMATION TO [phone] PT AWARE OF APPOINTMENT

## 2013-10-06 ENCOUNTER — Encounter: Payer: Self-pay | Admitting: Family Medicine

## 2013-10-07 ENCOUNTER — Encounter: Payer: Self-pay | Admitting: Internal Medicine

## 2013-11-08 ENCOUNTER — Ambulatory Visit (INDEPENDENT_AMBULATORY_CARE_PROVIDER_SITE_OTHER): Payer: BC Managed Care – PPO | Admitting: Family Medicine

## 2013-11-08 ENCOUNTER — Encounter: Payer: Self-pay | Admitting: Family Medicine

## 2013-11-08 VITALS — BP 116/70 | HR 64 | Ht 62.0 in | Wt 224.0 lb

## 2013-11-08 DIAGNOSIS — E039 Hypothyroidism, unspecified: Secondary | ICD-10-CM

## 2013-11-08 DIAGNOSIS — M1990 Unspecified osteoarthritis, unspecified site: Secondary | ICD-10-CM

## 2013-11-08 DIAGNOSIS — E785 Hyperlipidemia, unspecified: Secondary | ICD-10-CM

## 2013-11-08 DIAGNOSIS — Z0000 Encounter for general adult medical examination without abnormal findings: Secondary | ICD-10-CM

## 2013-11-08 DIAGNOSIS — J309 Allergic rhinitis, unspecified: Secondary | ICD-10-CM

## 2013-11-08 DIAGNOSIS — M129 Arthropathy, unspecified: Secondary | ICD-10-CM

## 2013-11-08 DIAGNOSIS — Z23 Encounter for immunization: Secondary | ICD-10-CM

## 2013-11-08 DIAGNOSIS — Z79899 Other long term (current) drug therapy: Secondary | ICD-10-CM

## 2013-11-08 LAB — COMPREHENSIVE METABOLIC PANEL
Albumin: 4 g/dL (ref 3.5–5.2)
BUN: 21 mg/dL (ref 6–23)
Calcium: 9.4 mg/dL (ref 8.4–10.5)
Chloride: 103 mEq/L (ref 96–112)
Creat: 0.79 mg/dL (ref 0.50–1.10)
Glucose, Bld: 99 mg/dL (ref 70–99)
Potassium: 4.3 mEq/L (ref 3.5–5.3)

## 2013-11-08 LAB — LIPID PANEL
Cholesterol: 202 mg/dL — ABNORMAL HIGH (ref 0–200)
HDL: 49 mg/dL
LDL Cholesterol: 142 mg/dL — ABNORMAL HIGH (ref 0–99)
Total CHOL/HDL Ratio: 4.1 ratio
Triglycerides: 53 mg/dL
VLDL: 11 mg/dL (ref 0–40)

## 2013-11-08 LAB — CBC WITH DIFFERENTIAL/PLATELET
Basophils Relative: 0 % (ref 0–1)
Eosinophils Absolute: 0.2 10*3/uL (ref 0.0–0.7)
Eosinophils Relative: 2 % (ref 0–5)
HCT: 42.5 % (ref 36.0–46.0)
Hemoglobin: 14.1 g/dL (ref 12.0–15.0)
MCH: 29.6 pg (ref 26.0–34.0)
MCHC: 33.2 g/dL (ref 30.0–36.0)
MCV: 89.1 fL (ref 78.0–100.0)
Monocytes Absolute: 0.5 10*3/uL (ref 0.1–1.0)
Monocytes Relative: 7 % (ref 3–12)
Neutro Abs: 4.6 10*3/uL (ref 1.7–7.7)
Platelets: 263 10*3/uL (ref 150–400)

## 2013-11-08 LAB — POCT URINALYSIS DIPSTICK
Ketones, UA: NEGATIVE
Leukocytes, UA: NEGATIVE
Protein, UA: NEGATIVE
Spec Grav, UA: <=1.005
Urobilinogen, UA: NEGATIVE
pH, UA: 7

## 2013-11-08 LAB — TSH: TSH: 1.08 u[IU]/mL (ref 0.350–4.500)

## 2013-11-08 NOTE — Progress Notes (Signed)
   Subjective:    Patient ID: Paula Cline, female    DOB: 15-Apr-1956, 57 y.o.   MRN: [id number]  HPI She is here for complete examination. She has lost a considerable by weight and notes some overall improvement in her health and well-being. She has made an effort to cut back on her carbs as well as getting good bleeding meats and proteins in her diet and now is doing a much better job. She recently had her left knee injections in this has helped with her mobility. She does continue on her Synthroid. Health maintenance and immunizations were reviewed. Her allergies are under good control.  She has no other concerns or complaints.  Review of Systems     Objective:   Physical Exam BP 116/70  Pulse 64  Ht 5\' 2"  (1.575 m)  Wt 224 lb (101.606 kg)  BMI 40.96 kg/m2  SpO2 99%  General Appearance:    Alert, cooperative, no distress, appears stated age  Head:    Normocephalic, without obvious abnormality, atraumatic  Eyes:    PERRL, conjunctiva/corneas clear, EOM's intact, fundi    benign  Ears:    Normal TM's and external ear canals  Nose:   Nares normal, mucosa normal, no drainage or sinus   tenderness  Throat:   Lips, mucosa, and tongue normal; teeth and gums normal  Neck:   Supple, no lymphadenopathy;  thyroid:  no   enlargement/tenderness/nodules; no carotid   bruit or JVD  Back:    Spine nontender, no curvature, ROM normal, no CVA     tenderness  Lungs:     Clear to auscultation bilaterally without wheezes, rales or     ronchi; respirations unlabored  Chest Wall:    No tenderness or deformity   Heart:    Regular rate and rhythm, S1 and S2 normal, no murmur, rub   or gallop  Breast Exam:    Deferred to GYN  Abdomen:     Soft, non-tender, nondistended, normoactive bowel sounds,    no masses, no hepatosplenomegaly  Genitalia:    Deferred to GYN     Extremities:   No clubbing, cyanosis or edema  Pulses:   2+ and symmetric all extremities  Skin:   Skin color, texture, turgor normal,  no rashes or lesions  Lymph nodes:   Cervical, supraclavicular, and axillary nodes normal  Neurologic:   CNII-XII intact, normal strength, sensation and gait; reflexes 2+ and symmetric throughout          Psych:   Normal mood, affect, hygiene and grooming.          Assessment & Plan:  Routine general medical examination at a health care facility  Hyperlipidemia LDL goal < 100 - Plan: POCT Urinalysis Dipstick, Lipid panel, CANCELED: POCT glycosylated hemoglobin (Hb A1C)  Obesity, Class III, BMI 40-49.9 (morbid obesity) - Plan: CBC with Differential, Comprehensive metabolic panel  Hypothyroid - Plan: TSH  Arthritis  Allergic rhinitis, mild  Encounter for long-term (current) use of other medications - Plan: Tdap vaccine greater than or equal to 7yo IM, CBC with Differential, Comprehensive metabolic panel, Lipid panel, TSH  I encouraged her to continue with her diet and exercise regimen. Discussed the fact that she needs to get rid of her close as she shrinks.

## 2013-11-19 ENCOUNTER — Other Ambulatory Visit (INDEPENDENT_AMBULATORY_CARE_PROVIDER_SITE_OTHER): Payer: BC Managed Care – PPO

## 2013-11-19 DIAGNOSIS — Z1211 Encounter for screening for malignant neoplasm of colon: Secondary | ICD-10-CM

## 2013-11-19 LAB — HEMOCCULT GUIAC POC 1CARD (OFFICE)
Card #2 Fecal Occult Blod, POC: NEGATIVE
Card #3 Fecal Occult Blood, POC: NEGATIVE
FECAL OCCULT BLD: NEGATIVE

## 2014-01-01 ENCOUNTER — Other Ambulatory Visit: Payer: Self-pay | Admitting: Family Medicine

## 2014-01-03 NOTE — Telephone Encounter (Signed)
Is this okay to refill? 

## 2014-06-02 ENCOUNTER — Other Ambulatory Visit: Payer: Self-pay | Admitting: Family Medicine

## 2014-09-29 LAB — HM MAMMOGRAPHY

## 2014-09-30 ENCOUNTER — Encounter: Payer: Self-pay | Admitting: Internal Medicine

## 2014-10-31 ENCOUNTER — Other Ambulatory Visit (INDEPENDENT_AMBULATORY_CARE_PROVIDER_SITE_OTHER): Payer: BC Managed Care – PPO

## 2014-10-31 DIAGNOSIS — Z23 Encounter for immunization: Secondary | ICD-10-CM

## 2014-11-15 ENCOUNTER — Other Ambulatory Visit: Payer: Self-pay | Admitting: Family Medicine

## 2014-11-17 ENCOUNTER — Encounter: Payer: BC Managed Care – PPO | Admitting: Family Medicine

## 2014-11-28 ENCOUNTER — Encounter: Payer: Self-pay | Admitting: Family Medicine

## 2014-11-28 ENCOUNTER — Ambulatory Visit (INDEPENDENT_AMBULATORY_CARE_PROVIDER_SITE_OTHER): Payer: BLUE CROSS/BLUE SHIELD | Admitting: Family Medicine

## 2014-11-28 ENCOUNTER — Other Ambulatory Visit (HOSPITAL_COMMUNITY)
Admission: RE | Admit: 2014-11-28 | Discharge: 2014-11-28 | Disposition: A | Discharge status: Home / Self Care | Payer: BLUE CROSS/BLUE SHIELD | Source: Ambulatory Visit | Attending: Family Medicine | Admitting: Family Medicine

## 2014-11-28 VITALS — BP 120/70 | Ht 61.0 in | Wt 202.0 lb

## 2014-11-28 DIAGNOSIS — B001 Herpesviral vesicular dermatitis: Secondary | ICD-10-CM

## 2014-11-28 DIAGNOSIS — E785 Hyperlipidemia, unspecified: Secondary | ICD-10-CM

## 2014-11-28 DIAGNOSIS — L719 Rosacea, unspecified: Secondary | ICD-10-CM

## 2014-11-28 DIAGNOSIS — J309 Allergic rhinitis, unspecified: Secondary | ICD-10-CM | POA: Diagnosis not present

## 2014-11-28 DIAGNOSIS — E669 Obesity, unspecified: Secondary | ICD-10-CM

## 2014-11-28 DIAGNOSIS — Z0000 Encounter for general adult medical examination without abnormal findings: Secondary | ICD-10-CM

## 2014-11-28 DIAGNOSIS — E039 Hypothyroidism, unspecified: Secondary | ICD-10-CM | POA: Diagnosis not present

## 2014-11-28 DIAGNOSIS — Z01419 Encounter for gynecological examination (general) (routine) without abnormal findings: Secondary | ICD-10-CM | POA: Diagnosis not present

## 2014-11-28 DIAGNOSIS — M1990 Unspecified osteoarthritis, unspecified site: Secondary | ICD-10-CM | POA: Diagnosis not present

## 2014-11-28 LAB — CBC WITH DIFFERENTIAL/PLATELET
Basophils Absolute: 0.1 10*3/uL (ref 0.0–0.1)
Basophils Relative: 1 % (ref 0–1)
Eosinophils Absolute: 0.2 10*3/uL (ref 0.0–0.7)
Eosinophils Relative: 2 % (ref 0–5)
HCT: 40.2 % (ref 36.0–46.0)
HEMOGLOBIN: 13.5 g/dL (ref 12.0–15.0)
Lymphocytes Relative: 31 % (ref 12–46)
Lymphs Abs: 2.4 10*3/uL (ref 0.7–4.0)
MCH: 30.1 pg (ref 26.0–34.0)
MCHC: 33.6 g/dL (ref 30.0–36.0)
MCV: 89.7 fL (ref 78.0–100.0)
MPV: 11.3 fL (ref 8.6–12.4)
Monocytes Absolute: 0.5 10*3/uL (ref 0.1–1.0)
Monocytes Relative: 6 % (ref 3–12)
NEUTROS ABS: 4.7 10*3/uL (ref 1.7–7.7)
Neutrophils Relative %: 60 % (ref 43–77)
Platelets: 241 10*3/uL (ref 150–400)
RBC: 4.48 MIL/uL (ref 3.87–5.11)
RDW: 13.8 % (ref 11.5–15.5)
WBC: 7.9 10*3/uL (ref 4.0–10.5)

## 2014-11-28 LAB — LIPID PANEL
Cholesterol: 226 mg/dL — ABNORMAL HIGH (ref 0–200)
HDL: 60 mg/dL (ref >39)
LDL CALC: 157 mg/dL — AB (ref 0–99)
Total CHOL/HDL Ratio: 3.8 Ratio
Triglycerides: 43 mg/dL (ref <150)
VLDL: 9 mg/dL (ref 0–40)

## 2014-11-28 LAB — COMPREHENSIVE METABOLIC PANEL
ALT: 14 U/L (ref 0–35)
AST: 19 U/L (ref 0–37)
Albumin: 3.9 g/dL (ref 3.5–5.2)
Alkaline Phosphatase: 53 U/L (ref 39–117)
BILIRUBIN TOTAL: 0.4 mg/dL (ref 0.2–1.2)
BUN: 25 mg/dL — AB (ref 6–23)
CALCIUM: 9.2 mg/dL (ref 8.4–10.5)
CHLORIDE: 105 meq/L (ref 96–112)
CO2: 27 mEq/L (ref 19–32)
Creat: 0.72 mg/dL (ref 0.50–1.10)
GLUCOSE: 88 mg/dL (ref 70–99)
POTASSIUM: 4.4 meq/L (ref 3.5–5.3)
SODIUM: 138 meq/L (ref 135–145)
TOTAL PROTEIN: 6.3 g/dL (ref 6.0–8.3)

## 2014-11-28 LAB — TSH: TSH: 2.205 u[IU]/mL (ref 0.350–4.500)

## 2014-11-28 MED ORDER — NAPROXEN 500 MG PO TABS: 500.0000 mg | ORAL_TABLET | Freq: Two times a day (BID) | ORAL | Status: DC

## 2014-11-28 NOTE — Progress Notes (Signed)
   Subjective:    Patient ID: Paula Cline, female    DOB: 02-04-1956, 59 y.o.   MRN: [id number]  HPI She is here for a complete examination. She continues to lose weight. She does have intermittent back pain and takes care of this fairly well. Also her knees do give her trouble and she responds nicely to the Naprosyn. Her allergies are under good control. She intermittently has difficulty with herpes labialis but does have enough medicines for that. She continues on her thyroid medication. Family and social history were reviewed. Immunizations and health maintenance were also updated.   Review of Systems  All other systems reviewed and are negative.      Objective:   Physical Exam BP 120/70 mmHg  Ht 5\' 1"  (1.549 m)  Wt 202 lb (91.627 kg)  BMI 38.19 kg/m2  General Appearance:    Alert, cooperative, no distress, appears stated age  Head:    Normocephalic, without obvious abnormality, atraumatic  Eyes:    PERRL, conjunctiva/corneas clear, EOM's intact, fundi    benign  Ears:    Normal TM's and external ear canals  Nose:   Nares normal, mucosa normal, no drainage or sinus   tenderness  Throat:   Lips, mucosa, and tongue normal; teeth and gums normal  Neck:   Supple, no lymphadenopathy;  thyroid:  no   enlargement/tenderness/nodules; no carotid   bruit or JVD  Back:    Spine nontender, no curvature, ROM normal, no CVA     tenderness  Lungs:     Clear to auscultation bilaterally without wheezes, rales or     ronchi; respirations unlabored  Chest Wall:    No tenderness or deformity   Heart:    Regular rate and rhythm, S1 and S2 normal, no murmur, rub   or gallop     Abdomen:     Soft, non-tender, nondistended, normoactive bowel sounds,    no masses, no hepatosplenomegaly  Genitalia:    Normal external genitalia without lesions.  BUS and vagina normal; cervix without lesions, or cervical motion tenderness. No abnormal vaginal discharge.  Uterus and adnexa not enlarged, nontender, no  masses.  Pap performed     Extremities:   No clubbing, cyanosis or edema  Pulses:   2+ and symmetric all extremities  Skin:   Skin color, texture, turgor normal, no rashes or lesions. face shows no evidence of rosacea   Lymph nodes:   Cervical, supraclavicular, and axillary nodes normal  Neurologic:   CNII-XII intact, normal strength, sensation and gait; reflexes 2+ and symmetric throughout          Psych:   Normal mood, affect, hygiene and grooming.         Assessment & Plan:  Routine general medical examination at a health care facility - Plan: CBC with Differential, Comprehensive metabolic panel, Lipid panel, TSH  Arthritis - Plan: naproxen (NAPROSYN) 500 MG tablet  Allergic rhinitis, mild  Herpes labialis - Plan: Cytology - PAP [place]  Hyperlipidemia with target LDL less than 100 - Plan: Lipid panel  Hypothyroidism, unspecified hypothyroidism type - Plan: TSH  Obesity - Plan: CBC with Differential, Comprehensive metabolic panel, Lipid panel  Rosacea, acne  encouraged her to keep up with her weight loss. She is now close to breaking the 200 pound area. Explained that this would help her back and her knees. Naprosyn renewed. Renew her thyroid med when the blood work comes in.

## 2014-11-29 MED ORDER — SYNTHROID 125 MCG PO TABS: 125.0000 ug | ORAL_TABLET | Freq: Every day | ORAL | Status: DC

## 2014-11-29 NOTE — Addendum Note (Signed)
Addended by: Denita Lung on: 11/29/2014 08:26 AM   Modules accepted: Orders

## 2014-11-30 ENCOUNTER — Ambulatory Visit (INDEPENDENT_AMBULATORY_CARE_PROVIDER_SITE_OTHER): Payer: BLUE CROSS/BLUE SHIELD | Admitting: Family Medicine

## 2014-11-30 ENCOUNTER — Ambulatory Visit: Payer: BLUE CROSS/BLUE SHIELD | Admitting: Family Medicine

## 2014-11-30 DIAGNOSIS — S61011A Laceration without foreign body of right thumb without damage to nail, initial encounter: Secondary | ICD-10-CM | POA: Diagnosis not present

## 2014-11-30 LAB — CYTOLOGY - PAP

## 2014-11-30 NOTE — Progress Notes (Signed)
   Subjective:    Patient ID: Paula Cline, female    DOB: Jan 23, 1956, 59 y.o.   MRN: [id number]  HPI She sustained a laceration to the palmar surface of the right thumb earlier today on glass. This occurred at home.   Review of Systems     Objective:   Physical Exam A Y-shaped total of 2 cm laceration is noted to the distal palmar surface of the right thumb.       Assessment & Plan:  Thumb laceration, right, initial encounter  the wound was cleaned with Betadine. No foreign materials were noted. Lidocaine was injected into the Y-shaped lesion. 3 5-0 polypropylene sutures were applied with good approximation of the edges. She is to keep the area clean and dry and return here in approximately one week for suture removal. Discussed possible infection with her.

## 2014-12-08 ENCOUNTER — Ambulatory Visit (INDEPENDENT_AMBULATORY_CARE_PROVIDER_SITE_OTHER): Payer: BLUE CROSS/BLUE SHIELD | Admitting: Family Medicine

## 2014-12-08 DIAGNOSIS — S61011D Laceration without foreign body of right thumb without damage to nail, subsequent encounter: Secondary | ICD-10-CM

## 2014-12-08 NOTE — Progress Notes (Signed)
   Subjective:    Patient ID: Paula Cline, female    DOB: Oct 09, 1956, 59 y.o.   MRN: [id number]  HPI She is here for suture removal from her right thumb. She has had no difficulty with this.   Review of Systems     Objective:   Physical Exam Exam of the right thumb shows 3 sutures to be in place with no erythema warmth or tenderness       Assessment & Plan:  Thumb laceration, right, subsequent encounter    the sutures were removed without difficulty.

## 2015-09-27 ENCOUNTER — Other Ambulatory Visit: Payer: Self-pay | Admitting: Family Medicine

## 2015-09-27 NOTE — Telephone Encounter (Signed)
This is Dr. Lanice Shirts pt

## 2015-09-27 NOTE — Telephone Encounter (Signed)
Is this okay to refill? Patient has appt in January.

## 2015-11-07 LAB — HM MAMMOGRAPHY

## 2015-11-14 ENCOUNTER — Encounter: Payer: Self-pay | Admitting: Family Medicine

## 2015-11-16 LAB — HM MAMMOGRAPHY

## 2015-11-22 ENCOUNTER — Encounter: Payer: Self-pay | Admitting: Family Medicine

## 2015-11-30 ENCOUNTER — Encounter: Payer: BLUE CROSS/BLUE SHIELD | Admitting: Family Medicine

## 2015-12-07 ENCOUNTER — Telehealth: Payer: Self-pay

## 2015-12-07 NOTE — Telephone Encounter (Signed)
Pt called to let you know that she finished her appt at [facility] she saw Dr. Veverly Fells. She says he wants to put her on Celebrex, she just wanted to run that by you and see if you ha anything to say about this. She also says he will be sending results of her x-ray today and that she has end stage arthritis in her knees.

## 2015-12-07 NOTE — Telephone Encounter (Signed)
Let her know that Celebrex is a good drug

## 2015-12-07 NOTE — Telephone Encounter (Signed)
Called patient, advised of Dr Cammy Brochure agreement on Cellabrex

## 2016-01-04 ENCOUNTER — Encounter: Payer: Self-pay | Admitting: Family Medicine

## 2016-01-04 ENCOUNTER — Ambulatory Visit (INDEPENDENT_AMBULATORY_CARE_PROVIDER_SITE_OTHER): Payer: BLUE CROSS/BLUE SHIELD | Admitting: Family Medicine

## 2016-01-04 VITALS — BP 182/104 | HR 82 | Resp 16 | Ht 61.0 in | Wt 221.6 lb

## 2016-01-04 DIAGNOSIS — Z1159 Encounter for screening for other viral diseases: Secondary | ICD-10-CM | POA: Diagnosis not present

## 2016-01-04 DIAGNOSIS — E669 Obesity, unspecified: Secondary | ICD-10-CM | POA: Diagnosis not present

## 2016-01-04 DIAGNOSIS — B001 Herpesviral vesicular dermatitis: Secondary | ICD-10-CM | POA: Diagnosis not present

## 2016-01-04 DIAGNOSIS — E039 Hypothyroidism, unspecified: Secondary | ICD-10-CM | POA: Diagnosis not present

## 2016-01-04 DIAGNOSIS — E785 Hyperlipidemia, unspecified: Secondary | ICD-10-CM | POA: Diagnosis not present

## 2016-01-04 DIAGNOSIS — Z0000 Encounter for general adult medical examination without abnormal findings: Secondary | ICD-10-CM

## 2016-01-04 DIAGNOSIS — J309 Allergic rhinitis, unspecified: Secondary | ICD-10-CM | POA: Diagnosis not present

## 2016-01-04 DIAGNOSIS — R030 Elevated blood-pressure reading, without diagnosis of hypertension: Secondary | ICD-10-CM | POA: Diagnosis not present

## 2016-01-04 DIAGNOSIS — L719 Rosacea, unspecified: Secondary | ICD-10-CM | POA: Diagnosis not present

## 2016-01-04 DIAGNOSIS — M1990 Unspecified osteoarthritis, unspecified site: Secondary | ICD-10-CM

## 2016-01-04 DIAGNOSIS — IMO0001 Reserved for inherently not codable concepts without codable children: Secondary | ICD-10-CM

## 2016-01-04 LAB — POCT URINALYSIS DIP (MANUAL ENTRY)
BILIRUBIN UA: NEGATIVE
Glucose, UA: NEGATIVE
Ketones, POC UA: NEGATIVE
Leukocytes, UA: NEGATIVE
NITRITE UA: NEGATIVE
PH UA: 6
Protein Ur, POC: NEGATIVE
RBC UA: NEGATIVE
Spec Grav, UA: 1.025
UROBILINOGEN UA: NEGATIVE

## 2016-01-04 LAB — CBC WITH DIFFERENTIAL/PLATELET
BASOS ABS: 0.1 10*3/uL (ref 0.0–0.1)
Basophils Relative: 1 % (ref 0–1)
EOS ABS: 0.1 10*3/uL (ref 0.0–0.7)
EOS PCT: 2 % (ref 0–5)
HCT: 42.5 % (ref 36.0–46.0)
Hemoglobin: 13.8 g/dL (ref 12.0–15.0)
LYMPHS ABS: 2 10*3/uL (ref 0.7–4.0)
Lymphocytes Relative: 30 % (ref 12–46)
MCH: 28.8 pg (ref 26.0–34.0)
MCHC: 32.5 g/dL (ref 30.0–36.0)
MCV: 88.7 fL (ref 78.0–100.0)
MONOS PCT: 6 % (ref 3–12)
MPV: 11.6 fL (ref 8.6–12.4)
Monocytes Absolute: 0.4 10*3/uL (ref 0.1–1.0)
Neutro Abs: 4 10*3/uL (ref 1.7–7.7)
Neutrophils Relative %: 61 % (ref 43–77)
PLATELETS: 223 10*3/uL (ref 150–400)
RBC: 4.79 MIL/uL (ref 3.87–5.11)
RDW: 13.6 % (ref 11.5–15.5)
WBC: 6.6 10*3/uL (ref 4.0–10.5)

## 2016-01-04 LAB — LIPID PANEL
CHOL/HDL RATIO: 3.7 ratio (ref <=5.0)
Cholesterol: 217 mg/dL — ABNORMAL HIGH (ref 125–200)
HDL: 58 mg/dL (ref >=46)
LDL Cholesterol: 151 mg/dL — ABNORMAL HIGH (ref <130)
Triglycerides: 42 mg/dL (ref <150)
VLDL: 8 mg/dL (ref <30)

## 2016-01-04 LAB — COMPREHENSIVE METABOLIC PANEL
ALT: 13 U/L (ref 6–29)
AST: 20 U/L (ref 10–35)
Albumin: 3.9 g/dL (ref 3.6–5.1)
Alkaline Phosphatase: 65 U/L (ref 33–130)
BUN: 19 mg/dL (ref 7–25)
CHLORIDE: 106 mmol/L (ref 98–110)
CO2: 25 mmol/L (ref 20–31)
Calcium: 9.2 mg/dL (ref 8.6–10.4)
Creat: 0.75 mg/dL (ref 0.50–1.05)
GLUCOSE: 91 mg/dL (ref 65–99)
POTASSIUM: 4.2 mmol/L (ref 3.5–5.3)
Sodium: 139 mmol/L (ref 135–146)
Total Bilirubin: 0.4 mg/dL (ref 0.2–1.2)
Total Protein: 6.3 g/dL (ref 6.1–8.1)

## 2016-01-04 LAB — TSH: TSH: 1.75 m[IU]/L

## 2016-01-04 NOTE — Patient Instructions (Signed)
Go back and start seeing Paula Cline again. 150 minutes a week of something physical Back here in a month

## 2016-01-04 NOTE — Progress Notes (Signed)
Subjective:    Patient ID: Paula Cline, female    DOB: 06-09-1956, 60 y.o.   MRN: [id number]  HPI She is here for a complete examination. Her allergies seem to be under good control. She continues to have difficulty with bilateral knee pain and is seen by Dr. Chaney Malling. She does get injections roughly every 3 months. She does exercise but the knee pain does keep her from being too active. She has gained weight. She was doing a good job when she was involved in counseling but then stopped counseling and has had difficulty in gained roughly 20 pounds back. She does have underlying herpes and does occasionally use Valtrex. She also continues on her thyroid medication. She has had no difficulty with chest pain, shortness of breath, GU issues. Health maintenance, immunizations, family and social history was reviewed.   Review of Systems  All other systems reviewed and are negative.      Objective:   Physical Exam BP 182/104 mmHg  Pulse 82  Resp 16  Ht 5\' 1"  (1.549 m)  Wt 221 lb 9.6 oz (100.517 kg)  BMI 41.89 kg/m2  LMP 11/18/2010  General Appearance:    Alert, cooperative, no distress, appears stated age  Head:    Normocephalic, without obvious abnormality, atraumatic  Eyes:    PERRL, conjunctiva/corneas clear, EOM's intact, fundi    benign  Ears:    Normal TM's and external ear canals  Nose:   Nares normal, mucosa normal, no drainage or sinus   tenderness  Throat:   Lips, mucosa, and tongue normal; teeth and gums normal  Neck:   Supple, no lymphadenopathy;  thyroid:  no   enlargement/tenderness/nodules; no carotid   bruit or JVD  Back:    Spine nontender, no curvature, ROM normal, no CVA     tenderness  Lungs:     Clear to auscultation bilaterally without wheezes, rales or     ronchi; respirations unlabored  Chest Wall:    No tenderness or deformity   Heart:    Regular rate and rhythm, S1 and S2 normal, no murmur, rub   or gallop  Breast Exam:    Deferred to GYN  Abdomen:      Soft, non-tender, nondistended, normoactive bowel sounds,    no masses, no hepatosplenomegaly  Genitalia:    Deferred to GYN     Extremities:   No clubbing, cyanosis or edema  Pulses:   2+ and symmetric all extremities  Skin:   Skin color, texture, turgor normal, slight erythema is noted over the malar area  Lymph nodes:   Cervical, supraclavicular, and axillary nodes normal  Neurologic:   CNII-XII intact, normal strength, sensation and gait; reflexes 2+ and symmetric throughout          Psych:   Normal mood, affect, hygiene and grooming.          Assessment & Plan:  Physical exam, annual - Plan: POCT urinalysis dipstick, CBC with Differential/Platelet, Comprehensive metabolic panel, Lipid panel  Allergic rhinitis, mild  Arthritis  Herpes labialis  Hyperlipidemia with target LDL less than 100 - Plan: Lipid panel  Hypothyroidism, unspecified hypothyroidism type - Plan: TSH  Obesity - Plan: CBC with Differential/Platelet, Comprehensive metabolic panel, Lipid panel  Rosacea, acne  Need for hepatitis C screening test - Plan: Hepatitis C antibody  Elevated blood pressure She is essentially up-to-date on her maintenance and immunizations. I strongly encouraged her to keep exercising and potentially use water aerobics. Discussed the benefits of  weight loss with her. Also encouraged her to go back to Darryl Hyers to help with her regimen concerning weight loss.Continue to use metronidazole gel on an as-needed basis which seems to be working.Continue with present medication regimen.

## 2016-01-05 LAB — HEPATITIS C ANTIBODY: HCV AB: NEGATIVE

## 2016-01-05 MED ORDER — SYNTHROID 125 MCG PO TABS: 125.0000 ug | ORAL_TABLET | Freq: Every day | ORAL | Status: DC

## 2016-01-05 NOTE — Addendum Note (Signed)
Addended by: Denita Lung on: 01/05/2016 08:16 AM   Modules accepted: Orders

## 2016-01-18 ENCOUNTER — Ambulatory Visit: Payer: BLUE CROSS/BLUE SHIELD | Admitting: Family Medicine

## 2016-01-18 ENCOUNTER — Ambulatory Visit (INDEPENDENT_AMBULATORY_CARE_PROVIDER_SITE_OTHER): Payer: BLUE CROSS/BLUE SHIELD | Admitting: Family Medicine

## 2016-01-18 VITALS — BP 170/90 | HR 64 | Wt 216.0 lb

## 2016-01-18 DIAGNOSIS — R42 Dizziness and giddiness: Secondary | ICD-10-CM | POA: Diagnosis not present

## 2016-01-18 DIAGNOSIS — I10 Essential (primary) hypertension: Secondary | ICD-10-CM

## 2016-01-18 DIAGNOSIS — F419 Anxiety disorder, unspecified: Secondary | ICD-10-CM | POA: Diagnosis not present

## 2016-01-18 MED ORDER — LISINOPRIL-HYDROCHLOROTHIAZIDE 10-12.5 MG PO TABS: 1.0000 | ORAL_TABLET | Freq: Every day | ORAL | Status: DC

## 2016-01-18 NOTE — Progress Notes (Signed)
   Subjective:    Patient ID: Paula Cline, female    DOB: 27-Apr-1956, 60 y.o.   MRN: [id number]  HPI She is here for consult concerning elevated blood pressure as well as anxiety and slight dizziness that she actually describes as feeling woozy. She is presently in counseling with Darryl Hiers and making good progress dealing with her anxiety. She notes that the woozy feeling tense occur at any time. She describes one time when she was lifting some objects and other times when she was under stress to feel this. She's had no blurred vision, double vision, weakness, heart rate changes.   Review of Systems     Objective:   Physical Exam Alert and in no distress with appropriate affect. Blood pressure is recorded.       Assessment & Plan:  Essential hypertension - Plan: lisinopril-hydrochlorothiazide (PRINZIDE,ZESTORETIC) 10-12.5 MG tablet  Dizziness  Anxiety it's time to treat her blood pressure. I informed her that her dizziness is not related to her blood pressure. Encouraged her to continue in counseling. Recommended she pick attention to when she has these dizziness/woozy sensation to see if there is any pattern to it as right now I do not think this is neurologic but could easily be psychological in nature. Recheck here 1 month.

## 2016-02-01 ENCOUNTER — Ambulatory Visit: Payer: BLUE CROSS/BLUE SHIELD | Admitting: Family Medicine

## 2016-02-22 ENCOUNTER — Encounter: Payer: Self-pay | Admitting: Family Medicine

## 2016-02-22 ENCOUNTER — Ambulatory Visit (INDEPENDENT_AMBULATORY_CARE_PROVIDER_SITE_OTHER): Payer: BLUE CROSS/BLUE SHIELD | Admitting: Family Medicine

## 2016-02-22 VITALS — BP 164/88 | HR 68 | Wt 209.6 lb

## 2016-02-22 DIAGNOSIS — I10 Essential (primary) hypertension: Secondary | ICD-10-CM | POA: Diagnosis not present

## 2016-02-22 DIAGNOSIS — F419 Anxiety disorder, unspecified: Secondary | ICD-10-CM

## 2016-02-22 MED ORDER — SUVOREXANT 10 MG PO TABS: 1.0000 | ORAL_TABLET | ORAL | Status: DC | PRN

## 2016-02-22 MED ORDER — LISINOPRIL-HYDROCHLOROTHIAZIDE 20-12.5 MG PO TABS: 1.0000 | ORAL_TABLET | Freq: Every day | ORAL | Status: DC

## 2016-02-22 NOTE — Progress Notes (Signed)
Subjective:     Patient ID: Paula Cline, female   DOB: 10-18-1956, 60 y.o.   MRN: [id number]  HPI  Paula Cline is here today for a 1 month follow up on her blood pressure after starting linsinopril-HCTZ 10-12.5.  She has not been having any vision changes, confusion, difficulty breathing, or dizziness.  She is currently checking her blood sugars twice a day with morning values around 150/85 and measurements after work around 170/90.    She is being followed by a psychologist for her anxiety.  She says she only has more severe episodes around 2/3 times per month, but feels she has a somewhat elevated level of baseline anxiety.  She also described some difficulty sleeping that she attributes to worrying about life stressors. She takes stress, eating, and sleep journals on the recommendation of her psychologist and thinks these are helping. She has ~2 drinks per week and is a non-smoker.     Review of Systems     Objective:   Physical Exam  Alert and in no distress.  Tympanic membranes and canals are normal.  Pharyngeal area is normal. Neck is supple without adenopathy or thyromegaly.  Cardiac exam shows a regular sinus rhythm without murmurs or gallops.  Lungs are clear to auscultation.  BP today in office: 164/88.      Assessment/Plan:     Essential hypertension - Plan: lisinopril-hydrochlorothiazide (ZESTORETIC) 20-12.5 MG tablet  Anxiety - Plan: Suvorexant (BELSOMRA) 10 MG TABS  Essential HTN Since she is still not to therapeutic goal, we will go up on the dose of lisinopril-HCTZ today.  We also discussed how it may only be necessary for her to measure her BP once a day when she has had at least 5 minutes to relax beforehand.  We will follow up to recheck her bp in 1 month and have also asked Paula Cline to bring in her bp cuff for calibration on that visit. Complimented her on her weight loss Anxiety We will try to help with her difficulty sleeping because her lack of sleep may  make it more difficult for her to deal with stressful events during the day and she is doing well with her psychologist with tools for dealing with anxiety.  She will let me know if this dosing strength works at not, I will increase it.

## 2016-02-22 NOTE — Patient Instructions (Signed)
Check your blood pressure daily and bring her cuff with you next time

## 2016-02-26 ENCOUNTER — Telehealth: Payer: Self-pay

## 2016-02-26 DIAGNOSIS — F4322 Adjustment disorder with anxiety: Secondary | ICD-10-CM | POA: Diagnosis not present

## 2016-02-26 NOTE — Telephone Encounter (Signed)
Pt informed sample put upfront and will pick up in the morning

## 2016-02-26 NOTE — Telephone Encounter (Signed)
Have her come by for a sample of the 15 mg

## 2016-02-26 NOTE — Telephone Encounter (Signed)
Pt stated that the 10mg  belsomra was no help, wants to know if she should up the dose or if you and her psychologist want her to try something else?

## 2016-02-27 ENCOUNTER — Other Ambulatory Visit: Payer: Self-pay | Admitting: Family Medicine

## 2016-02-27 MED ORDER — ALPRAZOLAM 0.25 MG PO TABS: 0.2500 mg | ORAL_TABLET | Freq: Two times a day (BID) | ORAL | Status: DC | PRN

## 2016-02-27 MED ORDER — SUVOREXANT 20 MG PO TABS: 1.0000 | ORAL_TABLET | Freq: Every evening | ORAL | Status: DC | PRN

## 2016-02-27 NOTE — Progress Notes (Signed)
She continues to have difficulty with sleep and did not respond to the 10 mg of Belsomra. I will go to 20 mg. I will also give Xanax to help panic attacks. She continues to make progress in counseling.

## 2016-02-28 ENCOUNTER — Other Ambulatory Visit: Payer: Self-pay

## 2016-02-28 ENCOUNTER — Telehealth: Payer: Self-pay

## 2016-02-28 MED ORDER — SUVOREXANT 20 MG PO TABS: 1.0000 | ORAL_TABLET | Freq: Every evening | ORAL | Status: DC | PRN

## 2016-02-28 NOTE — Telephone Encounter (Signed)
Pt called to state she took the trial belsomra 20mg  said she slept great and would like a RX

## 2016-02-28 NOTE — Telephone Encounter (Signed)
Call the [place] in

## 2016-02-28 NOTE — Telephone Encounter (Signed)
Called in per jcl 

## 2016-02-28 NOTE — Telephone Encounter (Signed)
Called in [place] per jcl

## 2016-03-03 ENCOUNTER — Telehealth: Payer: Self-pay | Admitting: Family Medicine

## 2016-03-03 NOTE — Telephone Encounter (Signed)
P.A. BELSOMRA 

## 2016-03-06 MED ORDER — ESZOPICLONE 3 MG PO TABS: 3.0000 mg | ORAL_TABLET | Freq: Every day | ORAL | Status: DC

## 2016-03-06 NOTE — Telephone Encounter (Signed)
P.A. Denied for Belsomra, pt needs trial of 2 alternatives, Zolpidem, Eszopiclone, Zaleplon.  Called pt & she states the Belsomra 10mg  didn't help, the 20mg  knocks her out but she still feels a little groggy in morning, she states she has tried Ambien in the past and is willing to try either of the others.  Do you want to switch or do you want me to do an appeal ?

## 2016-03-06 NOTE — Telephone Encounter (Signed)
I put in a phone order for the eszopiclone

## 2016-03-18 ENCOUNTER — Telehealth: Payer: Self-pay | Admitting: Family Medicine

## 2016-03-18 NOTE — Telephone Encounter (Signed)
Pt called to find out about her sleep medicine advised Dr. Redmond School changed to Eszopiclone and I called into pharmacy.  She will let us know if this doesn't work

## 2016-03-25 DIAGNOSIS — F4322 Adjustment disorder with anxiety: Secondary | ICD-10-CM | POA: Diagnosis not present

## 2016-03-28 ENCOUNTER — Ambulatory Visit (INDEPENDENT_AMBULATORY_CARE_PROVIDER_SITE_OTHER): Payer: BLUE CROSS/BLUE SHIELD | Admitting: Family Medicine

## 2016-03-28 ENCOUNTER — Encounter: Payer: Self-pay | Admitting: Family Medicine

## 2016-03-28 VITALS — BP 130/80 | HR 70 | Ht 61.0 in | Wt 206.0 lb

## 2016-03-28 DIAGNOSIS — F419 Anxiety disorder, unspecified: Secondary | ICD-10-CM

## 2016-03-28 DIAGNOSIS — M5489 Other dorsalgia: Secondary | ICD-10-CM | POA: Diagnosis not present

## 2016-03-28 DIAGNOSIS — G479 Sleep disorder, unspecified: Secondary | ICD-10-CM | POA: Diagnosis not present

## 2016-03-28 DIAGNOSIS — I10 Essential (primary) hypertension: Secondary | ICD-10-CM | POA: Diagnosis not present

## 2016-03-28 NOTE — Progress Notes (Signed)
   Subjective:    Patient ID: Paula Cline, female    DOB: 06-26-1956, 60 y.o.   MRN: [id number]  HPI  she is here for consultation concerning multiple issues. She did bring her blood pressure cuff. It does register roughly 8 points higher on the systolic end diastolic. The number she is getting a home all have been in good range. She has had some difficulty with sleep disturbance and is presently using Lunesta but being very careful with its use. She continues in counseling with Darryl Hyers concerning her anxiety and is making good progress there.  She has been doing a little more work than normal lately and is experiencing some mid back pain later on in the day after she has worked a long day.   Review of Systems     Objective:   Physical Exam  alert and in no distress otherwise not examined       Assessment & Plan:  Essential hypertension  Anxiety  Sleep disturbance  Other back pain  she will continue on her present blood pressure medication. She seems to have a good handle on using the [place]. She will continue in counseling with her therapist. Discussed proper back care in terms of lifting and physical activity. Recommend keeping her back is straight as possible.

## 2016-04-08 ENCOUNTER — Telehealth: Payer: Self-pay

## 2016-04-08 DIAGNOSIS — I10 Essential (primary) hypertension: Secondary | ICD-10-CM

## 2016-04-08 DIAGNOSIS — F320 Major depressive disorder, single episode, mild: Secondary | ICD-10-CM | POA: Diagnosis not present

## 2016-04-08 MED ORDER — LISINOPRIL-HYDROCHLOROTHIAZIDE 20-12.5 MG PO TABS: 1.0000 | ORAL_TABLET | Freq: Every day | ORAL | Status: DC

## 2016-04-08 NOTE — Telephone Encounter (Signed)
Fax rcvd for Lisinopril- HCTZ for 90 day refill, reordered for 90 days to [facility].

## 2016-04-22 DIAGNOSIS — F4322 Adjustment disorder with anxiety: Secondary | ICD-10-CM | POA: Diagnosis not present

## 2016-05-09 DIAGNOSIS — H5203 Hypermetropia, bilateral: Secondary | ICD-10-CM | POA: Diagnosis not present

## 2016-05-20 DIAGNOSIS — F320 Major depressive disorder, single episode, mild: Secondary | ICD-10-CM | POA: Diagnosis not present

## 2016-06-03 DIAGNOSIS — F320 Major depressive disorder, single episode, mild: Secondary | ICD-10-CM | POA: Diagnosis not present

## 2016-06-06 DIAGNOSIS — L814 Other melanin hyperpigmentation: Secondary | ICD-10-CM | POA: Diagnosis not present

## 2016-06-06 DIAGNOSIS — D225 Melanocytic nevi of trunk: Secondary | ICD-10-CM | POA: Diagnosis not present

## 2016-06-06 DIAGNOSIS — L821 Other seborrheic keratosis: Secondary | ICD-10-CM | POA: Diagnosis not present

## 2016-06-17 DIAGNOSIS — F4322 Adjustment disorder with anxiety: Secondary | ICD-10-CM | POA: Diagnosis not present

## 2016-06-26 ENCOUNTER — Other Ambulatory Visit: Payer: Self-pay | Admitting: Family Medicine

## 2016-06-26 DIAGNOSIS — I10 Essential (primary) hypertension: Secondary | ICD-10-CM

## 2016-07-01 DIAGNOSIS — F4322 Adjustment disorder with anxiety: Secondary | ICD-10-CM | POA: Diagnosis not present

## 2016-07-15 DIAGNOSIS — F4322 Adjustment disorder with anxiety: Secondary | ICD-10-CM | POA: Diagnosis not present

## 2016-07-23 DIAGNOSIS — M1712 Unilateral primary osteoarthritis, left knee: Secondary | ICD-10-CM | POA: Diagnosis not present

## 2016-07-29 DIAGNOSIS — F4322 Adjustment disorder with anxiety: Secondary | ICD-10-CM | POA: Diagnosis not present

## 2016-08-12 DIAGNOSIS — F4322 Adjustment disorder with anxiety: Secondary | ICD-10-CM | POA: Diagnosis not present

## 2016-08-23 DIAGNOSIS — Z23 Encounter for immunization: Secondary | ICD-10-CM | POA: Diagnosis not present

## 2016-08-26 DIAGNOSIS — F4322 Adjustment disorder with anxiety: Secondary | ICD-10-CM | POA: Diagnosis not present

## 2016-09-09 DIAGNOSIS — F4322 Adjustment disorder with anxiety: Secondary | ICD-10-CM | POA: Diagnosis not present

## 2016-09-22 ENCOUNTER — Other Ambulatory Visit: Payer: Self-pay | Admitting: Family Medicine

## 2016-09-22 DIAGNOSIS — I10 Essential (primary) hypertension: Secondary | ICD-10-CM

## 2016-09-23 ENCOUNTER — Telehealth: Payer: Self-pay

## 2016-09-23 DIAGNOSIS — F4322 Adjustment disorder with anxiety: Secondary | ICD-10-CM | POA: Diagnosis not present

## 2016-09-23 NOTE — Telephone Encounter (Signed)
ok 

## 2016-09-23 NOTE — Telephone Encounter (Signed)
Called in xanax 0.25mg  per JCL direction # 20 no refill.

## 2016-09-23 NOTE — Telephone Encounter (Signed)
Please advise on alprazolam script

## 2016-10-07 DIAGNOSIS — F4322 Adjustment disorder with anxiety: Secondary | ICD-10-CM | POA: Diagnosis not present

## 2016-10-08 DIAGNOSIS — M1712 Unilateral primary osteoarthritis, left knee: Secondary | ICD-10-CM | POA: Diagnosis not present

## 2016-10-21 DIAGNOSIS — F4322 Adjustment disorder with anxiety: Secondary | ICD-10-CM | POA: Diagnosis not present

## 2016-11-04 DIAGNOSIS — F4322 Adjustment disorder with anxiety: Secondary | ICD-10-CM | POA: Diagnosis not present

## 2016-11-21 DIAGNOSIS — Z1231 Encounter for screening mammogram for malignant neoplasm of breast: Secondary | ICD-10-CM | POA: Diagnosis not present

## 2016-11-21 LAB — HM MAMMOGRAPHY

## 2016-12-02 ENCOUNTER — Encounter: Payer: Self-pay | Admitting: Gastroenterology

## 2016-12-03 ENCOUNTER — Encounter: Payer: Self-pay | Admitting: Gastroenterology

## 2016-12-05 ENCOUNTER — Encounter: Payer: Self-pay | Admitting: Family Medicine

## 2016-12-06 ENCOUNTER — Telehealth: Payer: Self-pay | Admitting: Family Medicine

## 2016-12-06 NOTE — Telephone Encounter (Signed)
Pt came in and dropped off info concerning the clinical trial she is going to participate in. I am sending back for review.

## 2016-12-06 NOTE — Telephone Encounter (Signed)
Pt informed and verbalized understanding

## 2016-12-06 NOTE — Telephone Encounter (Signed)
Let her know that the study looks great

## 2016-12-11 DIAGNOSIS — F4322 Adjustment disorder with anxiety: Secondary | ICD-10-CM | POA: Diagnosis not present

## 2016-12-18 ENCOUNTER — Other Ambulatory Visit: Payer: Self-pay | Admitting: Family Medicine

## 2017-01-01 ENCOUNTER — Encounter: Payer: Self-pay | Admitting: Family Medicine

## 2017-01-10 ENCOUNTER — Encounter: Payer: BLUE CROSS/BLUE SHIELD | Admitting: Family Medicine

## 2017-01-16 ENCOUNTER — Ambulatory Visit (INDEPENDENT_AMBULATORY_CARE_PROVIDER_SITE_OTHER): Payer: BLUE CROSS/BLUE SHIELD | Admitting: Family Medicine

## 2017-01-16 ENCOUNTER — Encounter: Payer: Self-pay | Admitting: Family Medicine

## 2017-01-16 VITALS — BP 124/80 | HR 72 | Ht 61.0 in | Wt 201.0 lb

## 2017-01-16 DIAGNOSIS — B001 Herpesviral vesicular dermatitis: Secondary | ICD-10-CM | POA: Diagnosis not present

## 2017-01-16 DIAGNOSIS — E039 Hypothyroidism, unspecified: Secondary | ICD-10-CM

## 2017-01-16 DIAGNOSIS — I10 Essential (primary) hypertension: Secondary | ICD-10-CM

## 2017-01-16 DIAGNOSIS — F419 Anxiety disorder, unspecified: Secondary | ICD-10-CM | POA: Diagnosis not present

## 2017-01-16 DIAGNOSIS — J309 Allergic rhinitis, unspecified: Secondary | ICD-10-CM | POA: Diagnosis not present

## 2017-01-16 DIAGNOSIS — M1990 Unspecified osteoarthritis, unspecified site: Secondary | ICD-10-CM

## 2017-01-16 DIAGNOSIS — L719 Rosacea, unspecified: Secondary | ICD-10-CM

## 2017-01-16 DIAGNOSIS — E669 Obesity, unspecified: Secondary | ICD-10-CM

## 2017-01-16 DIAGNOSIS — Z6838 Body mass index (BMI) 38.0-38.9, adult: Secondary | ICD-10-CM | POA: Diagnosis not present

## 2017-01-16 DIAGNOSIS — Z0000 Encounter for general adult medical examination without abnormal findings: Secondary | ICD-10-CM | POA: Diagnosis not present

## 2017-01-16 DIAGNOSIS — E785 Hyperlipidemia, unspecified: Secondary | ICD-10-CM

## 2017-01-16 LAB — CBC WITH DIFFERENTIAL/PLATELET
BASOS ABS: 64 {cells}/uL (ref 0–200)
BASOS PCT: 1 %
EOS ABS: 128 {cells}/uL (ref 15–500)
Eosinophils Relative: 2 %
HEMATOCRIT: 40.1 % (ref 35.0–45.0)
HEMOGLOBIN: 13.4 g/dL (ref 11.7–15.5)
Lymphocytes Relative: 32 %
Lymphs Abs: 2048 cells/uL (ref 850–3900)
MCH: 29.3 pg (ref 27.0–33.0)
MCHC: 33.4 g/dL (ref 32.0–36.0)
MCV: 87.7 fL (ref 80.0–100.0)
MONO ABS: 512 {cells}/uL (ref 200–950)
MPV: 10.9 fL (ref 7.5–12.5)
Monocytes Relative: 8 %
Neutro Abs: 3648 cells/uL (ref 1500–7800)
Neutrophils Relative %: 57 %
PLATELETS: 256 10*3/uL (ref 140–400)
RBC: 4.57 MIL/uL (ref 3.80–5.10)
RDW: 13.3 % (ref 11.0–15.0)
WBC: 6.4 10*3/uL (ref 4.0–10.5)

## 2017-01-16 LAB — COMPREHENSIVE METABOLIC PANEL
ALBUMIN: 4 g/dL (ref 3.6–5.1)
ALK PHOS: 58 U/L (ref 33–130)
ALT: 14 U/L (ref 6–29)
AST: 21 U/L (ref 10–35)
BUN: 31 mg/dL — AB (ref 7–25)
CALCIUM: 9.5 mg/dL (ref 8.6–10.4)
CHLORIDE: 103 mmol/L (ref 98–110)
CO2: 27 mmol/L (ref 20–31)
Creat: 0.86 mg/dL (ref 0.50–0.99)
Glucose, Bld: 86 mg/dL (ref 65–99)
POTASSIUM: 4.2 mmol/L (ref 3.5–5.3)
Sodium: 138 mmol/L (ref 135–146)
TOTAL PROTEIN: 6.5 g/dL (ref 6.1–8.1)
Total Bilirubin: 0.5 mg/dL (ref 0.2–1.2)

## 2017-01-16 LAB — POCT URINALYSIS DIPSTICK
BILIRUBIN UA: NEGATIVE
Blood, UA: NEGATIVE
GLUCOSE UA: NEGATIVE
KETONES UA: NEGATIVE
LEUKOCYTES UA: NEGATIVE
NITRITE UA: NEGATIVE
PH UA: 6
Protein, UA: NEGATIVE
Spec Grav, UA: 1.025
Urobilinogen, UA: NEGATIVE

## 2017-01-16 LAB — LIPID PANEL
CHOL/HDL RATIO: 3.6 ratio (ref <5.0)
Cholesterol: 257 mg/dL — ABNORMAL HIGH (ref <200)
HDL: 71 mg/dL (ref >50)
LDL Cholesterol: 178 mg/dL — ABNORMAL HIGH (ref <100)
TRIGLYCERIDES: 40 mg/dL (ref <150)
VLDL: 8 mg/dL (ref <30)

## 2017-01-16 LAB — TSH: TSH: 1.51 mIU/L

## 2017-01-16 MED ORDER — LISINOPRIL-HYDROCHLOROTHIAZIDE 20-12.5 MG PO TABS: 1.0000 | ORAL_TABLET | Freq: Every day | ORAL | 3 refills | Status: DC

## 2017-01-16 MED ORDER — CELECOXIB 200 MG PO CAPS: 200.0000 mg | ORAL_CAPSULE | Freq: Every day | ORAL | 3 refills | Status: DC

## 2017-01-16 MED ORDER — VALACYCLOVIR HCL 1 G PO TABS: ORAL_TABLET | ORAL | 5 refills | Status: DC

## 2017-01-16 MED ORDER — SYNTHROID 125 MCG PO TABS: 125.0000 ug | ORAL_TABLET | Freq: Every day | ORAL | 3 refills | Status: DC

## 2017-01-16 NOTE — Progress Notes (Signed)
Subjective:    Patient ID: Paula Cline, female    DOB: 19-May-1956, 61 y.o.   MRN: [id number]  HPI She is here for complete examination. She continues to slowly lose weight and feels very good about this. She is having less difficulty with her knees but still does get occasional injections. She is being followed by Dr. Chaney Malling for this. She states that one Celebrex per day helps with the pain. She does have underlying anxiety but continues in counseling. She rarely uses her Xanax. She is also involved in a Alzheimer's research study and did have genetic testing for Alzheimer's. She is waiting for results to come in. She continues on her blood pressure medication and is having no trouble with that. She will occasionally have herpes labialis outbreak and is taking Valtrex appropriately. Continues on Synthroid without trouble. Her rosacea is not causing any difficulty. General things are going quite well for her. She has no other concerns or complaints. Family and social history as well as health maintenance and immunizations was reviewed.   Review of Systems  All other systems reviewed and are negative.      Objective:   Physical Exam BP 124/80   Pulse 72   Ht 5\' 1"  (1.549 m)   Wt 201 lb (91.2 kg)   LMP 11/18/2010   SpO2 99%   BMI 37.98 kg/m   General Appearance:    Alert, cooperative, no distress, appears stated age  Head:    Normocephalic, without obvious abnormality, atraumatic  Eyes:    PERRL, conjunctiva/corneas clear, EOM's intact, fundi    benign  Ears:    Normal TM's and external ear canals  Nose:   Nares normal, mucosa normal, no drainage or sinus   tenderness  Throat:   Lips, mucosa, and tongue normal; teeth and gums normal  Neck:   Supple, no lymphadenopathy;  thyroid:  no   enlargement/tenderness/nodules; no carotid   bruit or JVD     Lungs:     Clear to auscultation bilaterally without wheezes, rales or     ronchi; respirations unlabored      Heart:    Regular rate  and rhythm, S1 and S2 normal, no murmur, rub   or gallop  Breast Exam:    Deferred to GYN  Abdomen:     Soft, non-tender, nondistended, normoactive bowel sounds,    no masses, no hepatosplenomegaly  Genitalia:    Deferred to GYN     Extremities:   No clubbing, cyanosis or edema  Pulses:   2+ and symmetric all extremities  Skin:   Skin color, texture, turgor normal, no rashes or lesions  Lymph nodes:   Cervical, supraclavicular, and axillary nodes normal  Neurologic:   CNII-XII intact, normal strength, sensation and gait; reflexes 2+ and symmetric throughout          Psych:   Normal mood, affect, hygiene and grooming.       Assessment & Plan:  Routine general medical examination at a health care facility - Plan: POCT Urinalysis Dipstick, CBC with Differential/Platelet, Comprehensive metabolic panel, Lipid panel  Essential hypertension - Plan: CBC with Differential/Platelet, Comprehensive metabolic panel, lisinopril-hydrochlorothiazide (PRINZIDE,ZESTORETIC) 20-12.5 MG tablet  Anxiety  Allergic rhinitis, mild  Arthritis - Plan: celecoxib (CELEBREX) 200 MG capsule  Herpes labialis - Plan: valACYclovir (VALTREX) 1000 MG tablet  Hyperlipidemia with target LDL less than 100 - Plan: Lipid panel  Class 2 obesity without serious comorbidity with body mass index (BMI) of 38.0 to 38.9  in adult, unspecified obesity type  Hypothyroidism, unspecified type - Plan: TSH, SYNTHROID 125 MCG tablet  Rosacea, acne In general things are going quite well for her. Her medications were reviewed and renewed. Encouraged her to continue with her diet and exercise regimen. Discussed getting several pants sizes lower rather than setting a goal weight loss. She was comfortable with this. She will continue in counseling and to use her Xanax sparingly. You also return here when the next shingles vaccine becomes available.

## 2017-01-17 ENCOUNTER — Ambulatory Visit (AMBULATORY_SURGERY_CENTER): Payer: Self-pay | Admitting: *Deleted

## 2017-01-17 VITALS — Ht 61.0 in | Wt 204.0 lb

## 2017-01-17 DIAGNOSIS — Z1211 Encounter for screening for malignant neoplasm of colon: Secondary | ICD-10-CM

## 2017-01-17 MED ORDER — NA SULFATE-K SULFATE-MG SULF 17.5-3.13-1.6 GM/177ML PO SOLN: 1.0000 | Freq: Once | ORAL | 0 refills | Status: AC

## 2017-01-17 NOTE — Progress Notes (Signed)
Denies allergies to eggs or soy products. Denies complications with sedation or anesthesia. Denies O2 use. Denies use of diet or weight loss medications.  Emmi instructions given for colonoscopy.  

## 2017-01-21 ENCOUNTER — Encounter: Payer: Self-pay | Admitting: Gastroenterology

## 2017-01-21 ENCOUNTER — Telehealth: Payer: Self-pay | Admitting: Gastroenterology

## 2017-01-21 NOTE — Telephone Encounter (Signed)
Spoke with pt. She states she would need prior authorization for the Suprep and she does not want to change the prep to any thing else. Informed pt to come to the 4th floor to pick up a sample of the [place].  Pt states she will come today. Suprep sample   Lot # [id number], Exp 01/20 was given to the pt.

## 2017-01-31 ENCOUNTER — Ambulatory Visit (AMBULATORY_SURGERY_CENTER): Payer: BLUE CROSS/BLUE SHIELD | Admitting: Gastroenterology

## 2017-01-31 ENCOUNTER — Encounter: Payer: Self-pay | Admitting: Gastroenterology

## 2017-01-31 VITALS — BP 117/62 | HR 63 | Temp 96.8°F | Resp 12 | Ht 61.0 in | Wt 204.0 lb

## 2017-01-31 DIAGNOSIS — D120 Benign neoplasm of cecum: Secondary | ICD-10-CM

## 2017-01-31 DIAGNOSIS — Z1211 Encounter for screening for malignant neoplasm of colon: Secondary | ICD-10-CM

## 2017-01-31 DIAGNOSIS — Z86010 Personal history of colonic polyps: Secondary | ICD-10-CM | POA: Diagnosis not present

## 2017-01-31 DIAGNOSIS — K5730 Diverticulosis of large intestine without perforation or abscess without bleeding: Secondary | ICD-10-CM | POA: Diagnosis not present

## 2017-01-31 MED ORDER — SODIUM CHLORIDE 0.9 % IV SOLN: 500.0000 mL | INTRAVENOUS | Status: DC

## 2017-01-31 NOTE — Patient Instructions (Signed)
YOU HAD AN ENDOSCOPIC PROCEDURE TODAY AT THE [facility]:   Refer to the procedure report that was given to you for any specific questions about what was found during the examination.  If the procedure report does not answer your questions, please call your gastroenterologist to clarify.  If you requested that your care partner not be given the details of your procedure findings, then the procedure report has been included in a sealed envelope for you to review at your convenience later.  YOU SHOULD EXPECT: Some feelings of bloating in the abdomen. Passage of more gas than usual.  Walking can help get rid of the air that was put into your GI tract during the procedure and reduce the bloating. If you had a lower endoscopy (such as a colonoscopy or flexible sigmoidoscopy) you may notice spotting of blood in your stool or on the toilet paper. If you underwent a bowel prep for your procedure, you may not have a normal bowel movement for a few days.  Please Note:  You might notice some irritation and congestion in your nose or some drainage.  This is from the oxygen used during your procedure.  There is no need for concern and it should clear up in a day or so.  SYMPTOMS TO REPORT IMMEDIATELY:   Following lower endoscopy (colonoscopy or flexible sigmoidoscopy):  Excessive amounts of blood in the stool  Significant tenderness or worsening of abdominal pains  Swelling of the abdomen that is new, acute  Fever of 100F or higher  For urgent or emergent issues, a gastroenterologist can be reached at any hour by calling [phone].   DIET:  We do recommend a small meal at first, but then you may proceed to your regular diet.  Drink plenty of fluids but you should avoid alcoholic beverages for 24 hours.  ACTIVITY:  You should plan to take it easy for the rest of today and you should NOT DRIVE or use heavy machinery until tomorrow (because of the sedation medicines used during the test).     FOLLOW UP: Our staff will call the number listed on your records the next business day following your procedure to check on you and address any questions or concerns that you may have regarding the information given to you following your procedure. If we do not reach you, we will leave a message.  However, if you are feeling well and you are not experiencing any problems, there is no need to return our call.  We will assume that you have returned to your regular daily activities without incident.  If any biopsies were taken you will be contacted by phone or by letter within the next 1-3 weeks.  Please call us at [phone] if you have not heard about the biopsies in 3 weeks.   SIGNATURES/CONFIDENTIALITY: You and/or your care partner have signed paperwork which will be entered into your electronic medical record.  These signatures attest to the fact that that the information above on your After Visit Summary has been reviewed and is understood.  Full responsibility of the confidentiality of this discharge information lies with you and/or your care-partner.  Await pathology  Please read over handouts about polyps, diverticulosis and high fiber diets  Continue your normal medications 

## 2017-01-31 NOTE — Progress Notes (Signed)
A and O x3. Report to RN. Tolerated MAC anesthesia well.

## 2017-01-31 NOTE — Progress Notes (Signed)
Called to room to assist during endoscopic procedure.  Patient ID and intended procedure confirmed with present staff. Received instructions for my participation in the procedure from the performing physician.  

## 2017-01-31 NOTE — Op Note (Signed)
[facility] Patient Name: Paula Cline Procedure Date: 01/31/2017 7:49 AM MRN: [id number] Endoscopist: Milus Banister , MD Age: 61 Referring MD:  Date of Birth: 1956-09-02 Gender: Female Account #: [id number] Procedure:                Colonoscopy Indications:              High risk colon cancer surveillance: Personal                            history of colonic polyps; adenomatous polyp 2007 Medicines:                Monitored Anesthesia Care Procedure:                Pre-Anesthesia Assessment:                           - Prior to the procedure, a History and Physical                            was performed, and patient medications and                            allergies were reviewed. The patient's tolerance of                            previous anesthesia was also reviewed. The risks                            and benefits of the procedure and the sedation                            options and risks were discussed with the patient.                            All questions were answered, and informed consent                            was obtained. Prior Anticoagulants: The patient has                            taken no previous anticoagulant or antiplatelet                            agents. ASA Grade Assessment: II - A patient with                            mild systemic disease. After reviewing the risks                            and benefits, the patient was deemed in                            satisfactory condition to undergo the procedure.  After obtaining informed consent, the colonoscope                            was passed under direct vision. Throughout the                            procedure, the patient's blood pressure, pulse, and                            oxygen saturations were monitored continuously. The                            Colonoscope was introduced through the anus and                            advanced to the  the cecum, identified by                            appendiceal orifice and ileocecal valve. The                            colonoscopy was performed without difficulty. The                            patient tolerated the procedure well. The quality                            of the bowel preparation was good. The ileocecal                            valve, appendiceal orifice, and rectum were                            photographed. Scope In: 5:78:46 AM Scope Out: 9:62:95 AM Scope Withdrawal Time: 0 hours 7 minutes 32 seconds  Total Procedure Duration: 0 hours 10 minutes 3 seconds  Findings:                 A 4 mm polyp was found in the cecum. The polyp was                            sessile. The polyp was removed with a cold snare.                            Resection and retrieval were complete.                           Multiple small and large-mouthed diverticula were                            found in the entire colon.                           The site of [place] Ink injection in sigmoid colon  was located, no recurrent or residual polypoid                            mucosa.                           The exam was otherwise without abnormality on                            direct and retroflexion views. Complications:            No immediate complications. Estimated blood loss:                            None. Estimated Blood Loss:     Estimated blood loss: none. Impression:               - One 4 mm polyp in the cecum, removed with a cold                            snare. Resected and retrieved.                           - Diverticulosis in the entire examined colon.                           - The examination was otherwise normal on direct                            and retroflexion views. Recommendation:           - Patient has a contact number available for                            emergencies. The signs and symptoms of potential                             delayed complications were discussed with the                            patient. Return to normal activities tomorrow.                            Written discharge instructions were provided to the                            patient.                           - Resume previous diet.                           - Continue present medications.                           You will receive a letter within 2-3 weeks with the  pathology results and my final recommendations.                           If the polyp(s) is proven to be 'pre-cancerous' on                            pathology, you will need repeat colonoscopy in 5                            years. If the polyp(s) is NOT 'precancerous' on                            pathology then you should repeat colon cancer                            screening in 10 years with colonoscopy without need                            for colon cancer screening by any method prior to                            then (including stool testing). Milus Banister, MD 01/31/2017 8:10:37 AM This report has been signed electronically.

## 2017-02-03 ENCOUNTER — Telehealth: Payer: Self-pay

## 2017-02-03 NOTE — Telephone Encounter (Signed)
  Follow up Call-  Call back number 01/31/2017  Post procedure Call Back phone  # [phone]  Permission to leave phone message Yes  Some recent data might be hidden     Patient questions:  Do you have a fever, pain , or abdominal swelling? No. Pain Score  0 *   Pt verbalize everything went well after her procedure. Verbalize she has come down with a cold,sore throat, runny nose and coughing. Pt said procedure went fine. Advised if having any concerns to please call back. Have you tolerated food without any problems? Yes.    Have you been able to return to your normal activities? Yes.    Do you have any questions about your discharge instructions: Diet   No. Medications  No. Follow up visit  No.  Do you have questions or concerns about your Care? No.  Actions: * If pain score is 4 or above: No action needed, pain <4.

## 2017-02-04 ENCOUNTER — Encounter: Payer: Self-pay | Admitting: Gastroenterology

## 2017-02-05 ENCOUNTER — Ambulatory Visit (INDEPENDENT_AMBULATORY_CARE_PROVIDER_SITE_OTHER): Payer: BLUE CROSS/BLUE SHIELD | Admitting: Family Medicine

## 2017-02-05 ENCOUNTER — Encounter: Payer: Self-pay | Admitting: Family Medicine

## 2017-02-05 VITALS — BP 120/70 | HR 74 | Temp 98.4°F | Wt 202.0 lb

## 2017-02-05 DIAGNOSIS — J069 Acute upper respiratory infection, unspecified: Secondary | ICD-10-CM

## 2017-02-05 NOTE — Progress Notes (Signed)
   Subjective:    Patient ID: Paula Cline, female    DOB: 03/18/1956, 61 y.o.   MRN: [id number]  HPI She complains of a three-day history of chest congestion, slight cough, sore throat fever and chills. Today she woke up feeling better.   Review of Systems     Objective:   Physical Exam Alert and in no distress. Tympanic membranes and canals are normal. Pharyngeal area is normal. Neck is supple without adenopathy or thyromegaly. Cardiac exam shows a regular sinus rhythm without murmurs or gallops. Lungs are clear to auscultation.       Assessment & Plan:  Acute URI since she is feeling better, no intervention is needed. She was comfortable with that.

## 2017-02-20 DIAGNOSIS — M1712 Unilateral primary osteoarthritis, left knee: Secondary | ICD-10-CM | POA: Diagnosis not present

## 2017-03-25 DIAGNOSIS — M25561 Pain in right knee: Secondary | ICD-10-CM | POA: Diagnosis not present

## 2017-03-25 DIAGNOSIS — M1712 Unilateral primary osteoarthritis, left knee: Secondary | ICD-10-CM | POA: Diagnosis not present

## 2017-03-27 DIAGNOSIS — F4322 Adjustment disorder with anxiety: Secondary | ICD-10-CM | POA: Diagnosis not present

## 2017-04-06 ENCOUNTER — Other Ambulatory Visit: Payer: Self-pay | Admitting: Family Medicine

## 2017-04-07 NOTE — Telephone Encounter (Signed)
ok 

## 2017-04-07 NOTE — Telephone Encounter (Signed)
Called in [place] per [employer]

## 2017-04-07 NOTE — Telephone Encounter (Signed)
Is this okay?

## 2017-04-24 ENCOUNTER — Telehealth: Payer: Self-pay | Admitting: Family Medicine

## 2017-04-24 NOTE — Telephone Encounter (Signed)
Pt had samples in drawer/she was informed over a year ago. [place] samples returned to sample room.

## 2017-05-01 DIAGNOSIS — M1712 Unilateral primary osteoarthritis, left knee: Secondary | ICD-10-CM | POA: Diagnosis not present

## 2017-05-08 DIAGNOSIS — M1712 Unilateral primary osteoarthritis, left knee: Secondary | ICD-10-CM | POA: Diagnosis not present

## 2017-05-14 DIAGNOSIS — H2513 Age-related nuclear cataract, bilateral: Secondary | ICD-10-CM | POA: Diagnosis not present

## 2017-05-15 DIAGNOSIS — M1712 Unilateral primary osteoarthritis, left knee: Secondary | ICD-10-CM | POA: Diagnosis not present

## 2017-05-24 ENCOUNTER — Other Ambulatory Visit: Payer: Self-pay | Admitting: Family Medicine

## 2017-05-26 NOTE — Telephone Encounter (Signed)
Called in xanax [place]

## 2017-05-26 NOTE — Telephone Encounter (Signed)
Is this okay to refill? 

## 2017-05-26 NOTE — Telephone Encounter (Signed)
ok 

## 2017-07-16 ENCOUNTER — Encounter: Payer: Self-pay | Admitting: Family Medicine

## 2017-07-17 ENCOUNTER — Encounter: Payer: Self-pay | Admitting: Family Medicine

## 2017-07-17 ENCOUNTER — Ambulatory Visit (INDEPENDENT_AMBULATORY_CARE_PROVIDER_SITE_OTHER): Payer: BLUE CROSS/BLUE SHIELD | Admitting: Family Medicine

## 2017-07-17 VITALS — BP 116/70 | HR 68 | Wt 213.0 lb

## 2017-07-17 DIAGNOSIS — R0602 Shortness of breath: Secondary | ICD-10-CM

## 2017-07-17 NOTE — Progress Notes (Signed)
   Subjective:    Patient ID: Paula Cline, female    DOB: September 11, 1956, 61 y.o.   MRN: [id number]  HPI She is here for evaluation of 2 episodes of shortness of breath. She relates that both of these occurs when she was outside in the heat however she has been and he did at times and has had no difficulty. She also notes that she thinks her heart rate might of been fast but did not check it. No chest pain, diaphoresis, weakness. She has no history of allergies or asthma and does not smoke. 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   Review of Systems     Objective:   Physical Exam Alert and in no distress. Tympanic membranes and canals are normal. Pharyngeal area is normal. Neck is supple without adenopathy or thyromegaly. Cardiac exam shows a regular sinus rhythm without murmurs or gallops. Lungs are clear to auscultation. EKG shows no acute changes.        Assessment & Plan:  SOB (shortness of breath)  Discussed the shortness of breath with her in detail. Recommend she be vigilant in regard to when where and why this occurs. Also showed her how to check her pulse. She will keep me informed concerning this. I explained that I did not think she was in any danger.

## 2017-08-15 ENCOUNTER — Encounter: Payer: Self-pay | Admitting: Family Medicine

## 2017-08-15 ENCOUNTER — Other Ambulatory Visit (INDEPENDENT_AMBULATORY_CARE_PROVIDER_SITE_OTHER): Payer: BLUE CROSS/BLUE SHIELD

## 2017-08-15 DIAGNOSIS — Z23 Encounter for immunization: Secondary | ICD-10-CM | POA: Diagnosis not present

## 2017-08-21 ENCOUNTER — Encounter: Payer: Self-pay | Admitting: Family Medicine

## 2017-08-21 ENCOUNTER — Ambulatory Visit (INDEPENDENT_AMBULATORY_CARE_PROVIDER_SITE_OTHER): Payer: BLUE CROSS/BLUE SHIELD | Admitting: Family Medicine

## 2017-08-21 VITALS — BP 130/80 | HR 83 | Temp 97.6°F | Wt 213.0 lb

## 2017-08-21 DIAGNOSIS — L0291 Cutaneous abscess, unspecified: Secondary | ICD-10-CM

## 2017-08-21 MED ORDER — MUPIROCIN 2 % EX OINT: TOPICAL_OINTMENT | CUTANEOUS | 0 refills | Status: DC

## 2017-08-21 NOTE — Progress Notes (Signed)
   Subjective:    Patient ID: Paula Cline, female    DOB: 1956-02-13, 61 y.o.   MRN: [id number]  HPI Chief Complaint  Patient presents with  . infected ingrown    infected ingrown hair in vaginal area   She is here with complaints of a bump in her vaginal area since yesterday. States she found this just randomly. Denies tenderness, pain. States she did try to press it last night but got nothing out of it.  States she sweats a lot during the day while working and often does not shower after going home in the evening.  Denies fever, chills, N/V/D. No history of abscess.   No history of diabetes or MRSA. Is not currently sexually active.  Reviewed allergies, medications, past medical, surgical, and social history.   Review of Systems Pertinent positives and negatives in the history of present illness.     Objective:   Physical Exam  Genitourinary:     Genitourinary Comments: Pea size firm superficial mass that is non tender. No erythema, drainage.  External genitalia normal otherwise.    BP 130/80   Pulse 83   Temp 97.6 F (36.4 C) (Oral)   Wt 213 lb (96.6 kg)   LMP 11/18/2010   BMI 40.25 kg/m        Assessment & Plan:  Abscess  Advised that she does not currently have an infection but will have her do warm compresses, sitz baths and use the topical Mupirocin. She will follow up if the area gets larger or becomes painful.  Discussed good hygiene and keeping the area as clean and dry as possible.

## 2017-08-21 NOTE — Patient Instructions (Signed)
Use warm compresses several times per day.  Apply the ointment 3 times per day.  Call or return if this is getting worse.      How to Take a Sitz Bath A sitz bath is a warm water bath that is taken while you are sitting down. The water should only come up to your hips and should cover your buttocks. Your health care provider may recommend a sitz bath to help you:  Clean the lower part of your body, including your genital area.  With itching.  With pain.  With sore muscles or muscles that tighten or spasm.  How to take a sitz bath Take 3-4 sitz baths per day or as told by your health care provider. 1. Partially fill a bathtub with warm water. You will only need the water to be deep enough to cover your hips and buttocks when you are sitting in it. 2. If your health care provider told you to put medicine in the water, follow the directions exactly. 3. Sit in the water and open the tub drain a little. 4. Turn on the warm water again to keep the tub at the correct level. Keep the water running constantly. 5. Soak in the water for 15-20 minutes or as told by your health care provider. 6. After the sitz bath, pat the affected area dry first. Do not rub it. 7. Be careful when you stand up after the sitz bath because you may feel dizzy.  Contact a health care provider if:  Your symptoms get worse. Do not continue with sitz baths if your symptoms get worse.  You have new symptoms. Do not continue with sitz baths until you talk with your health care provider. This information is not intended to replace advice given to you by your health care provider. Make sure you discuss any questions you have with your health care provider. Document Released: 07/27/2004 Document Revised: 04/03/2016 Document Reviewed: 11/02/2014 Elsevier Interactive Patient Education  [employer].

## 2017-10-06 ENCOUNTER — Encounter: Payer: Self-pay | Admitting: Family Medicine

## 2017-10-08 DIAGNOSIS — M1712 Unilateral primary osteoarthritis, left knee: Secondary | ICD-10-CM | POA: Diagnosis not present

## 2017-11-20 ENCOUNTER — Telehealth: Payer: Self-pay | Admitting: Family Medicine

## 2017-11-20 MED ORDER — ALPRAZOLAM 0.25 MG PO TABS: ORAL_TABLET | ORAL | 0 refills | Status: DC

## 2017-11-20 NOTE — Telephone Encounter (Signed)
Rcvd refill request for Alprazolam  0.5 mg

## 2017-12-04 DIAGNOSIS — Z1231 Encounter for screening mammogram for malignant neoplasm of breast: Secondary | ICD-10-CM | POA: Diagnosis not present

## 2017-12-10 DIAGNOSIS — M1712 Unilateral primary osteoarthritis, left knee: Secondary | ICD-10-CM | POA: Insufficient documentation

## 2017-12-15 DIAGNOSIS — F4322 Adjustment disorder with anxiety: Secondary | ICD-10-CM | POA: Diagnosis not present

## 2017-12-17 DIAGNOSIS — M1712 Unilateral primary osteoarthritis, left knee: Secondary | ICD-10-CM | POA: Diagnosis not present

## 2017-12-25 ENCOUNTER — Telehealth: Payer: Self-pay | Admitting: Family Medicine

## 2017-12-25 DIAGNOSIS — M1712 Unilateral primary osteoarthritis, left knee: Secondary | ICD-10-CM | POA: Diagnosis not present

## 2017-12-25 MED ORDER — ESZOPICLONE 3 MG PO TABS: 3.0000 mg | ORAL_TABLET | Freq: Every day | ORAL | 1 refills | Status: DC

## 2017-12-25 NOTE — Telephone Encounter (Signed)
Rcvd New Refill request for Eszopiclone 3 mg

## 2018-01-23 ENCOUNTER — Encounter: Payer: BLUE CROSS/BLUE SHIELD | Admitting: Family Medicine

## 2018-02-09 ENCOUNTER — Encounter: Payer: Self-pay | Admitting: Family Medicine

## 2018-02-09 ENCOUNTER — Ambulatory Visit: Payer: BLUE CROSS/BLUE SHIELD | Admitting: Family Medicine

## 2018-02-09 VITALS — BP 122/78 | HR 67 | Ht 62.0 in | Wt 214.8 lb

## 2018-02-09 DIAGNOSIS — E785 Hyperlipidemia, unspecified: Secondary | ICD-10-CM | POA: Diagnosis not present

## 2018-02-09 DIAGNOSIS — E669 Obesity, unspecified: Secondary | ICD-10-CM | POA: Diagnosis not present

## 2018-02-09 DIAGNOSIS — Z23 Encounter for immunization: Secondary | ICD-10-CM

## 2018-02-09 DIAGNOSIS — Z6838 Body mass index (BMI) 38.0-38.9, adult: Secondary | ICD-10-CM

## 2018-02-09 DIAGNOSIS — I10 Essential (primary) hypertension: Secondary | ICD-10-CM | POA: Diagnosis not present

## 2018-02-09 DIAGNOSIS — Z0000 Encounter for general adult medical examination without abnormal findings: Secondary | ICD-10-CM

## 2018-02-09 DIAGNOSIS — J309 Allergic rhinitis, unspecified: Secondary | ICD-10-CM

## 2018-02-09 DIAGNOSIS — M1990 Unspecified osteoarthritis, unspecified site: Secondary | ICD-10-CM

## 2018-02-09 DIAGNOSIS — E039 Hypothyroidism, unspecified: Secondary | ICD-10-CM | POA: Diagnosis not present

## 2018-02-09 DIAGNOSIS — F419 Anxiety disorder, unspecified: Secondary | ICD-10-CM

## 2018-02-09 DIAGNOSIS — L719 Rosacea, unspecified: Secondary | ICD-10-CM

## 2018-02-09 LAB — POCT URINALYSIS DIP (PROADVANTAGE DEVICE)
BILIRUBIN UA: NEGATIVE mg/dL
Bilirubin, UA: NEGATIVE
Blood, UA: NEGATIVE
GLUCOSE UA: NEGATIVE mg/dL
Leukocytes, UA: NEGATIVE
Nitrite, UA: NEGATIVE
Protein Ur, POC: NEGATIVE mg/dL
SPECIFIC GRAVITY, URINE: 1.015
Urobilinogen, Ur: 3.5
pH, UA: 7 (ref 5.0–8.0)

## 2018-02-09 MED ORDER — SYNTHROID 125 MCG PO TABS: 125.0000 ug | ORAL_TABLET | Freq: Every day | ORAL | 3 refills | Status: DC

## 2018-02-09 MED ORDER — LISINOPRIL-HYDROCHLOROTHIAZIDE 20-12.5 MG PO TABS: 1.0000 | ORAL_TABLET | Freq: Every day | ORAL | 3 refills | Status: DC

## 2018-02-09 NOTE — Patient Instructions (Signed)
If your allergy start to give you trouble, use Flonase nasal spray regularly

## 2018-02-09 NOTE — Progress Notes (Signed)
   Subjective:    Patient ID: Paula Cline, female    DOB: February 03, 1956, 62 y.o.   MRN: [id number]  HPI She is here for complete examination.  She continues in counseling however has backed off due to finances.  She does think that she is handling her anxiety much better than in the past.  She continues on her Prinzide without difficulty.  She also is taking thyroid medication and having no trouble with that.  She does have a history of rosacea but presently is not taking any medication.  She also has a previous history of hyperlipidemia.  Her arthritis seems to be under good control with her present OTC meds.  In general things are going well for her.  Family and social history as well as health maintenance and immunizations was reviewed.   Review of Systems  All other systems reviewed and are negative.      Objective:   Physical Exam Routine general medical examination at a health care facility - Plan: CBC with Differential/Platelet, Comprehensive metabolic panel, Lipid panel, POCT Urinalysis DIP (Proadvantage Device)  Allergic rhinitis, mild  Anxiety  Arthritis  Essential hypertension - Plan: CBC with Differential/Platelet, Comprehensive metabolic panel, lisinopril-hydrochlorothiazide (PRINZIDE,ZESTORETIC) 20-12.5 MG tablet  Hyperlipidemia with target LDL less than 100  Hypothyroidism, unspecified type - Plan: TSH, SYNTHROID 125 MCG tablet  Class 2 obesity without serious comorbidity with body mass index (BMI) of 38.0 to 38.9 in adult, unspecified obesity type - Plan: CBC with Differential/Platelet, Comprehensive metabolic panel, Lipid panel  Rosacea, acne  Need for shingles vaccine - Plan: Varicella-zoster vaccine IM (Shingrix)         Assessment & Plan:  Routine general medical examination at a health care facility - Plan: CBC with Differential/Platelet, Comprehensive metabolic panel, Lipid panel, POCT Urinalysis DIP (Proadvantage Device)  Allergic rhinitis,  mild  Anxiety  Arthritis  Essential hypertension - Plan: CBC with Differential/Platelet, Comprehensive metabolic panel, lisinopril-hydrochlorothiazide (PRINZIDE,ZESTORETIC) 20-12.5 MG tablet  Hyperlipidemia with target LDL less than 100  Hypothyroidism, unspecified type - Plan: TSH, SYNTHROID 125 MCG tablet  Class 2 obesity without serious comorbidity with body mass index (BMI) of 38.0 to 38.9 in adult, unspecified obesity type - Plan: CBC with Differential/Platelet, Comprehensive metabolic panel, Lipid panel  Rosacea, acne  Need for shingles vaccine - Plan: Varicella-zoster vaccine IM (Shingrix) I will do a Pap and pelvic with her next visit.  She will continue on her present medication regimen.  Encouraged her to continue in counseling.  Also discussed diet and exercise with her.  Again stressed the need for her to make further lifestyle changes in that regard.  We will also start her on shingles vaccine.

## 2018-02-10 LAB — LIPID PANEL
CHOL/HDL RATIO: 3.5 ratio (ref 0.0–4.4)
Cholesterol, Total: 233 mg/dL — ABNORMAL HIGH (ref 100–199)
HDL: 67 mg/dL (ref >39)
LDL CALC: 154 mg/dL — AB (ref 0–99)
TRIGLYCERIDES: 58 mg/dL (ref 0–149)
VLDL Cholesterol Cal: 12 mg/dL (ref 5–40)

## 2018-02-10 LAB — CBC WITH DIFFERENTIAL/PLATELET
Basophils Absolute: 0 10*3/uL (ref 0.0–0.2)
Basos: 0 %
EOS (ABSOLUTE): 0.2 10*3/uL (ref 0.0–0.4)
EOS: 3 %
HEMATOCRIT: 41.2 % (ref 34.0–46.6)
HEMOGLOBIN: 13.5 g/dL (ref 11.1–15.9)
IMMATURE GRANS (ABS): 0 10*3/uL (ref 0.0–0.1)
IMMATURE GRANULOCYTES: 0 %
Lymphocytes Absolute: 1.9 10*3/uL (ref 0.7–3.1)
Lymphs: 28 %
MCH: 29.2 pg (ref 26.6–33.0)
MCHC: 32.8 g/dL (ref 31.5–35.7)
MCV: 89 fL (ref 79–97)
MONOCYTES: 8 %
Monocytes Absolute: 0.5 10*3/uL (ref 0.1–0.9)
NEUTROS PCT: 61 %
Neutrophils Absolute: 4.2 10*3/uL (ref 1.4–7.0)
Platelets: 249 10*3/uL (ref 150–379)
RBC: 4.63 x10E6/uL (ref 3.77–5.28)
RDW: 13.7 % (ref 12.3–15.4)
WBC: 6.9 10*3/uL (ref 3.4–10.8)

## 2018-02-10 LAB — COMPREHENSIVE METABOLIC PANEL
ALBUMIN: 4.3 g/dL (ref 3.6–4.8)
ALT: 23 IU/L (ref 0–32)
AST: 48 IU/L — AB (ref 0–40)
Albumin/Globulin Ratio: 2 (ref 1.2–2.2)
Alkaline Phosphatase: 78 IU/L (ref 39–117)
BUN/Creatinine Ratio: 24 (ref 12–28)
BUN: 21 mg/dL (ref 8–27)
Bilirubin Total: 0.3 mg/dL (ref 0.0–1.2)
CALCIUM: 9.5 mg/dL (ref 8.7–10.3)
CO2: 25 mmol/L (ref 20–29)
CREATININE: 0.88 mg/dL (ref 0.57–1.00)
Chloride: 100 mmol/L (ref 96–106)
GFR calc Af Amer: 82 mL/min/{1.73_m2} (ref >59)
GFR, EST NON AFRICAN AMERICAN: 71 mL/min/{1.73_m2} (ref >59)
Globulin, Total: 2.2 g/dL (ref 1.5–4.5)
Glucose: 95 mg/dL (ref 65–99)
Potassium: 4.8 mmol/L (ref 3.5–5.2)
SODIUM: 139 mmol/L (ref 134–144)
TOTAL PROTEIN: 6.5 g/dL (ref 6.0–8.5)

## 2018-02-10 LAB — TSH: TSH: 2.91 u[IU]/mL (ref 0.450–4.500)

## 2018-02-12 ENCOUNTER — Encounter: Payer: BLUE CROSS/BLUE SHIELD | Admitting: Family Medicine

## 2018-02-12 DIAGNOSIS — M1712 Unilateral primary osteoarthritis, left knee: Secondary | ICD-10-CM | POA: Diagnosis not present

## 2018-04-16 ENCOUNTER — Other Ambulatory Visit (INDEPENDENT_AMBULATORY_CARE_PROVIDER_SITE_OTHER): Payer: BLUE CROSS/BLUE SHIELD

## 2018-04-16 DIAGNOSIS — Z23 Encounter for immunization: Secondary | ICD-10-CM | POA: Diagnosis not present

## 2018-05-02 ENCOUNTER — Other Ambulatory Visit: Payer: Self-pay | Admitting: Family Medicine

## 2018-05-04 NOTE — Telephone Encounter (Signed)
[facility] is requesting to fill pt xanax. Please advise [facility] 

## 2018-06-08 ENCOUNTER — Other Ambulatory Visit: Payer: Self-pay | Admitting: Family Medicine

## 2018-06-08 NOTE — Telephone Encounter (Signed)
Paula Cline is requesting to fill pt xanax. Please advise [facility]

## 2018-07-07 DIAGNOSIS — H43812 Vitreous degeneration, left eye: Secondary | ICD-10-CM | POA: Diagnosis not present

## 2018-08-04 DIAGNOSIS — M1712 Unilateral primary osteoarthritis, left knee: Secondary | ICD-10-CM | POA: Diagnosis not present

## 2018-08-11 DIAGNOSIS — M1712 Unilateral primary osteoarthritis, left knee: Secondary | ICD-10-CM | POA: Diagnosis not present

## 2018-08-18 DIAGNOSIS — M1712 Unilateral primary osteoarthritis, left knee: Secondary | ICD-10-CM | POA: Diagnosis not present

## 2018-10-13 ENCOUNTER — Other Ambulatory Visit: Payer: Self-pay | Admitting: Family Medicine

## 2018-10-13 NOTE — Telephone Encounter (Signed)
[facility] is requesting to fill pt eszopiclone. Please advise [facility]

## 2018-11-09 DIAGNOSIS — M25561 Pain in right knee: Secondary | ICD-10-CM | POA: Diagnosis not present

## 2018-12-10 DIAGNOSIS — Z1231 Encounter for screening mammogram for malignant neoplasm of breast: Secondary | ICD-10-CM | POA: Diagnosis not present

## 2018-12-10 LAB — HM MAMMOGRAPHY

## 2019-02-03 ENCOUNTER — Encounter: Payer: Self-pay | Admitting: Family Medicine

## 2019-02-11 ENCOUNTER — Encounter: Payer: BLUE CROSS/BLUE SHIELD | Admitting: Family Medicine

## 2019-02-11 ENCOUNTER — Telehealth: Payer: Self-pay

## 2019-02-11 DIAGNOSIS — E039 Hypothyroidism, unspecified: Secondary | ICD-10-CM

## 2019-02-11 DIAGNOSIS — I10 Essential (primary) hypertension: Secondary | ICD-10-CM

## 2019-02-11 MED ORDER — LISINOPRIL-HYDROCHLOROTHIAZIDE 20-12.5 MG PO TABS: 1.0000 | ORAL_TABLET | Freq: Every day | ORAL | 3 refills | Status: DC

## 2019-02-11 MED ORDER — SYNTHROID 125 MCG PO TABS: 125.0000 ug | ORAL_TABLET | Freq: Every day | ORAL | 1 refills | Status: DC

## 2019-02-11 NOTE — Telephone Encounter (Signed)
Requesting to have xanax and eszopiclone filled. Please advise . [place]

## 2019-02-12 MED ORDER — ESZOPICLONE 3 MG PO TABS: ORAL_TABLET | ORAL | 0 refills | Status: DC

## 2019-02-12 MED ORDER — ALPRAZOLAM 0.25 MG PO TABS: ORAL_TABLET | ORAL | 0 refills | Status: DC

## 2019-02-16 ENCOUNTER — Telehealth: Payer: Self-pay | Admitting: Family Medicine

## 2019-02-16 DIAGNOSIS — B001 Herpesviral vesicular dermatitis: Secondary | ICD-10-CM

## 2019-02-16 MED ORDER — VALACYCLOVIR HCL 1 G PO TABS: ORAL_TABLET | ORAL | 5 refills | Status: DC

## 2019-02-16 NOTE — Telephone Encounter (Signed)
I called it in 

## 2019-02-16 NOTE — Telephone Encounter (Signed)
Pt called and said she has a fever blister and wants to know if she can get a refill on Valtrex called into the [facility] on [address]

## 2019-02-16 NOTE — Telephone Encounter (Signed)
lvm for pt . [facility] 

## 2019-02-18 ENCOUNTER — Encounter: Payer: BLUE CROSS/BLUE SHIELD | Admitting: Family Medicine

## 2019-04-13 DIAGNOSIS — M1712 Unilateral primary osteoarthritis, left knee: Secondary | ICD-10-CM | POA: Diagnosis not present

## 2019-04-13 DIAGNOSIS — M1711 Unilateral primary osteoarthritis, right knee: Secondary | ICD-10-CM | POA: Diagnosis not present

## 2019-04-13 DIAGNOSIS — M170 Bilateral primary osteoarthritis of knee: Secondary | ICD-10-CM | POA: Diagnosis not present

## 2019-04-19 DIAGNOSIS — M1711 Unilateral primary osteoarthritis, right knee: Secondary | ICD-10-CM | POA: Insufficient documentation

## 2019-04-20 DIAGNOSIS — M170 Bilateral primary osteoarthritis of knee: Secondary | ICD-10-CM | POA: Diagnosis not present

## 2019-04-27 DIAGNOSIS — M170 Bilateral primary osteoarthritis of knee: Secondary | ICD-10-CM | POA: Diagnosis not present

## 2019-06-03 ENCOUNTER — Encounter: Payer: Self-pay | Admitting: Family Medicine

## 2019-06-03 ENCOUNTER — Ambulatory Visit: Payer: BC Managed Care – PPO | Admitting: Family Medicine

## 2019-06-03 ENCOUNTER — Other Ambulatory Visit: Payer: Self-pay

## 2019-06-03 ENCOUNTER — Other Ambulatory Visit (HOSPITAL_COMMUNITY)
Admission: RE | Admit: 2019-06-03 | Discharge: 2019-06-03 | Disposition: A | Discharge status: Home / Self Care | Payer: BC Managed Care – PPO | Source: Ambulatory Visit | Attending: Family Medicine | Admitting: Family Medicine

## 2019-06-03 VITALS — BP 124/82 | HR 69 | Temp 98.3°F | Ht 61.5 in | Wt 214.8 lb

## 2019-06-03 DIAGNOSIS — Z124 Encounter for screening for malignant neoplasm of cervix: Secondary | ICD-10-CM

## 2019-06-03 DIAGNOSIS — E039 Hypothyroidism, unspecified: Secondary | ICD-10-CM | POA: Diagnosis not present

## 2019-06-03 DIAGNOSIS — M1990 Unspecified osteoarthritis, unspecified site: Secondary | ICD-10-CM

## 2019-06-03 DIAGNOSIS — E785 Hyperlipidemia, unspecified: Secondary | ICD-10-CM | POA: Diagnosis not present

## 2019-06-03 DIAGNOSIS — Z6838 Body mass index (BMI) 38.0-38.9, adult: Secondary | ICD-10-CM

## 2019-06-03 DIAGNOSIS — B001 Herpesviral vesicular dermatitis: Secondary | ICD-10-CM

## 2019-06-03 DIAGNOSIS — I10 Essential (primary) hypertension: Secondary | ICD-10-CM

## 2019-06-03 DIAGNOSIS — Z0000 Encounter for general adult medical examination without abnormal findings: Secondary | ICD-10-CM

## 2019-06-03 DIAGNOSIS — E669 Obesity, unspecified: Secondary | ICD-10-CM | POA: Diagnosis not present

## 2019-06-03 DIAGNOSIS — F419 Anxiety disorder, unspecified: Secondary | ICD-10-CM

## 2019-06-03 DIAGNOSIS — J309 Allergic rhinitis, unspecified: Secondary | ICD-10-CM

## 2019-06-03 MED ORDER — CELECOXIB 200 MG PO CAPS: 200.0000 mg | ORAL_CAPSULE | Freq: Every day | ORAL | 3 refills | Status: DC

## 2019-06-03 MED ORDER — SYNTHROID 125 MCG PO TABS: 125.0000 ug | ORAL_TABLET | Freq: Every day | ORAL | 3 refills | Status: DC

## 2019-06-03 MED ORDER — LISINOPRIL-HYDROCHLOROTHIAZIDE 20-12.5 MG PO TABS: 1.0000 | ORAL_TABLET | Freq: Every day | ORAL | 3 refills | Status: DC

## 2019-06-03 NOTE — Progress Notes (Signed)
Subjective:    Patient ID: Paula Cline, female    DOB: 10-14-1956, 62 y.o.   MRN: [id number]  HPI She is here for a complete examination.  She does occasionally describe dizziness with position and states that she feels as if she has orthostatic hypotension.  She does have hypothyroidism and is presently is on Synthroid and doing well on that.  She does have allergies and handling this with over-the-counter medications.  She also has arthritis and is using Celebrex as well as topical diclofenac and getting good results with that.  She did have an outbreak of herpes labialis earlier this year but none recently.  She does occasionally have difficulty with anxiety and uses Xanax very sparingly.  She also uses Lunesta again on a very sparing basis to help with her sleep.  She also uses melatonin.  She continues on lisinopril/HCTZ for her hypertension.  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  Her diet is unchanged.  Family and social history as well as health maintenance and immunizations was reviewed.   Review of Systems  All other systems reviewed and are negative.      Objective:   Physical Exam BP 124/82 (BP Location: Right Arm, Patient Position: Sitting)   Pulse 69   Temp 98.3 F (36.8 C)   Ht 5' 1.5" (1.562 m)   Wt 214 lb 12.8 oz (97.4 kg)   LMP 11/18/2010   SpO2 98%   BMI 39.93 kg/m   General Appearance:    Alert, cooperative, no distress, appears stated age  Head:    Normocephalic, without obvious abnormality, atraumatic  Eyes:    PERRL, conjunctiva/corneas clear, EOM's intact, fundi    benign  Ears:    Normal TM's and external ear canals  Nose:   Nares normal, mucosa normal, no drainage or sinus   tenderness  Throat:   Lips, mucosa, and tongue normal; teeth and gums normal  Neck:   Supple, no lymphadenopathy;  thyroid:  no   enlargement/tenderness/nodules; no carotid   bruit or JVD  Back:    Spine nontender, no curvature, ROM normal, no CVA     tenderness   Lungs:     Clear to auscultation bilaterally without wheezes, rales or     ronchi; respirations unlabored      Heart:    Regular rate and rhythm, S1 and S2 normal, no murmur, rub   or gallop  Breast Exam:  DEFERRED  Abdomen:     Soft, non-tender, nondistended, normoactive bowel sounds,    no masses, no hepatosplenomegaly  Genitalia:    Normal external genitalia without lesions.  BUS and vagina normal; cervix without lesions, or cervical motion tenderness. No abnormal vaginal discharge.  Uterus and adnexa not enlarged, nontender, no masses.  Pap performed     Extremities:   No clubbing, cyanosis or edema  Pulses:   2+ and symmetric all extremities  Skin:   Skin color, texture, turgor normal, no rashes or lesions  Lymph nodes:   Cervical, supraclavicular, and axillary nodes normal  Neurologic:   CNII-XII intact, normal strength, sensation and gait; reflexes 2+ and symmetric throughout          Psych:   Normal mood, affect, hygiene and grooming.        Assessment & Plan:  Routine general medical examination at a health care facility - Plan: CBC with Differential/Platelet, Comprehensive metabolic panel, Lipid panel, Cytology - PAP([place]),   Hypothyroidism, unspecified type - Plan:  TSH,   Class 2 obesity without serious comorbidity with body mass index (BMI) of 38.0 to 38.9 in adult, unspecified obesity type - Plan: Continue to remain physically active.  Allergic rhinitis, mild - Plan: Continue on OTC medications  Arthritis - Plan: Continue on present medications  Hyperlipidemia with target LDL less than 100 - Plan: Lipid panel, lipid  Herpes labialis - Plan: Treat as needed  Essential hypertension - Plan: CBC with Differential/Platelet, Comprehensive metabolic panel,   Anxiety - Plan: Xanax as needed  Screening for cervical cancer - Plan: Cytology - PAP([place]), were done to count, help shoe but is also a little bit of what it does is that he cleans it up and makes you  put the diagnosis to your plan together and I am okay with

## 2019-06-04 ENCOUNTER — Telehealth: Payer: Self-pay | Admitting: Family Medicine

## 2019-06-04 LAB — CBC WITH DIFFERENTIAL/PLATELET
Basophils Absolute: 0.1 10*3/uL (ref 0.0–0.2)
Basos: 1 %
EOS (ABSOLUTE): 0.1 10*3/uL (ref 0.0–0.4)
Eos: 2 %
Hematocrit: 40 % (ref 34.0–46.6)
Hemoglobin: 13.4 g/dL (ref 11.1–15.9)
Immature Grans (Abs): 0 10*3/uL (ref 0.0–0.1)
Immature Granulocytes: 0 %
Lymphocytes Absolute: 2.1 10*3/uL (ref 0.7–3.1)
Lymphs: 28 %
MCH: 29.5 pg (ref 26.6–33.0)
MCHC: 33.5 g/dL (ref 31.5–35.7)
MCV: 88 fL (ref 79–97)
Monocytes Absolute: 0.5 10*3/uL (ref 0.1–0.9)
Monocytes: 6 %
Neutrophils Absolute: 4.7 10*3/uL (ref 1.4–7.0)
Neutrophils: 63 %
Platelets: 234 10*3/uL (ref 150–450)
RBC: 4.55 x10E6/uL (ref 3.77–5.28)
RDW: 12.6 % (ref 11.7–15.4)
WBC: 7.5 10*3/uL (ref 3.4–10.8)

## 2019-06-04 LAB — COMPREHENSIVE METABOLIC PANEL
ALT: 12 IU/L (ref 0–32)
AST: 23 IU/L (ref 0–40)
Albumin/Globulin Ratio: 1.8 (ref 1.2–2.2)
Albumin: 4.3 g/dL (ref 3.8–4.8)
Alkaline Phosphatase: 75 IU/L (ref 39–117)
BUN/Creatinine Ratio: 26 (ref 12–28)
BUN: 25 mg/dL (ref 8–27)
Bilirubin Total: 0.3 mg/dL (ref 0.0–1.2)
CO2: 23 mmol/L (ref 20–29)
Calcium: 9.7 mg/dL (ref 8.7–10.3)
Chloride: 100 mmol/L (ref 96–106)
Creatinine, Ser: 0.95 mg/dL (ref 0.57–1.00)
GFR calc Af Amer: 74 mL/min/{1.73_m2} (ref >59)
GFR calc non Af Amer: 64 mL/min/{1.73_m2} (ref >59)
Globulin, Total: 2.4 g/dL (ref 1.5–4.5)
Glucose: 104 mg/dL — ABNORMAL HIGH (ref 65–99)
Potassium: 4.2 mmol/L (ref 3.5–5.2)
Sodium: 137 mmol/L (ref 134–144)
Total Protein: 6.7 g/dL (ref 6.0–8.5)

## 2019-06-04 LAB — LIPID PANEL
Chol/HDL Ratio: 3.7 ratio (ref 0.0–4.4)
Cholesterol, Total: 250 mg/dL — ABNORMAL HIGH (ref 100–199)
HDL: 68 mg/dL (ref >39)
LDL Calculated: 173 mg/dL — ABNORMAL HIGH (ref 0–99)
Triglycerides: 44 mg/dL (ref 0–149)
VLDL Cholesterol Cal: 9 mg/dL (ref 5–40)

## 2019-06-04 LAB — TSH: TSH: 2.41 u[IU]/mL (ref 0.450–4.500)

## 2019-06-04 NOTE — Telephone Encounter (Signed)
Cytology at [facility] called and said the order for pts Pap was entered wrong. They said make sure the resulted agent is selected as [facility]

## 2019-06-04 NOTE — Telephone Encounter (Signed)
Done

## 2019-06-04 NOTE — Addendum Note (Signed)
Addended by: Edgar Frisk on: 06/04/2019 04:04 PM   Modules accepted: Orders

## 2019-06-04 NOTE — Telephone Encounter (Signed)
Please take care of this.  

## 2019-06-07 LAB — CYTOLOGY - PAP: Diagnosis: NEGATIVE

## 2019-07-01 ENCOUNTER — Other Ambulatory Visit: Payer: Self-pay | Admitting: Family Medicine

## 2019-07-01 NOTE — Telephone Encounter (Signed)
[facility] is requesting to fill pt xanax and eszopiclone. Please advise [facility]

## 2019-10-21 ENCOUNTER — Encounter: Payer: Self-pay | Admitting: Family Medicine

## 2019-10-21 ENCOUNTER — Telehealth: Payer: Self-pay | Admitting: Family Medicine

## 2019-10-21 ENCOUNTER — Other Ambulatory Visit: Payer: Self-pay | Admitting: Family Medicine

## 2019-10-21 NOTE — Telephone Encounter (Signed)
[facility] is requesting to fill pt xanax and eszopiclone. Please advise [facility]

## 2019-10-21 NOTE — Telephone Encounter (Signed)
Requested records received from [facility].

## 2019-10-26 ENCOUNTER — Other Ambulatory Visit: Payer: Self-pay

## 2019-10-26 ENCOUNTER — Observation Stay (HOSPITAL_COMMUNITY)
Admission: EM | Admit: 2019-10-26 | Discharge: 2019-10-27 | Disposition: A | Discharge status: Home / Self Care | Payer: BC Managed Care – PPO | Attending: Internal Medicine | Admitting: Internal Medicine

## 2019-10-26 ENCOUNTER — Encounter: Payer: Self-pay | Admitting: Family Medicine

## 2019-10-26 ENCOUNTER — Encounter (HOSPITAL_COMMUNITY): Payer: Self-pay | Admitting: Emergency Medicine

## 2019-10-26 ENCOUNTER — Ambulatory Visit: Payer: BC Managed Care – PPO | Admitting: Family Medicine

## 2019-10-26 ENCOUNTER — Emergency Department (HOSPITAL_COMMUNITY): Payer: BC Managed Care – PPO

## 2019-10-26 VITALS — BP 104/72 | HR 67 | Temp 98.2°F | Wt 212.2 lb

## 2019-10-26 DIAGNOSIS — I700 Atherosclerosis of aorta: Secondary | ICD-10-CM | POA: Diagnosis not present

## 2019-10-26 DIAGNOSIS — R7989 Other specified abnormal findings of blood chemistry: Secondary | ICD-10-CM

## 2019-10-26 DIAGNOSIS — I471 Supraventricular tachycardia, unspecified: Secondary | ICD-10-CM | POA: Diagnosis present

## 2019-10-26 DIAGNOSIS — R778 Other specified abnormalities of plasma proteins: Secondary | ICD-10-CM

## 2019-10-26 DIAGNOSIS — E039 Hypothyroidism, unspecified: Secondary | ICD-10-CM | POA: Diagnosis present

## 2019-10-26 DIAGNOSIS — E669 Obesity, unspecified: Secondary | ICD-10-CM | POA: Insufficient documentation

## 2019-10-26 DIAGNOSIS — E7800 Pure hypercholesterolemia, unspecified: Secondary | ICD-10-CM | POA: Diagnosis not present

## 2019-10-26 DIAGNOSIS — Z6838 Body mass index (BMI) 38.0-38.9, adult: Secondary | ICD-10-CM | POA: Diagnosis not present

## 2019-10-26 DIAGNOSIS — Z87891 Personal history of nicotine dependence: Secondary | ICD-10-CM | POA: Insufficient documentation

## 2019-10-26 DIAGNOSIS — Z79890 Hormone replacement therapy (postmenopausal): Secondary | ICD-10-CM | POA: Insufficient documentation

## 2019-10-26 DIAGNOSIS — M1990 Unspecified osteoarthritis, unspecified site: Secondary | ICD-10-CM | POA: Insufficient documentation

## 2019-10-26 DIAGNOSIS — R7309 Other abnormal glucose: Secondary | ICD-10-CM | POA: Diagnosis not present

## 2019-10-26 DIAGNOSIS — Z8249 Family history of ischemic heart disease and other diseases of the circulatory system: Secondary | ICD-10-CM | POA: Diagnosis not present

## 2019-10-26 DIAGNOSIS — E785 Hyperlipidemia, unspecified: Secondary | ICD-10-CM | POA: Insufficient documentation

## 2019-10-26 DIAGNOSIS — Z885 Allergy status to narcotic agent status: Secondary | ICD-10-CM | POA: Diagnosis not present

## 2019-10-26 DIAGNOSIS — R000 Tachycardia, unspecified: Secondary | ICD-10-CM | POA: Diagnosis not present

## 2019-10-26 DIAGNOSIS — Z79899 Other long term (current) drug therapy: Secondary | ICD-10-CM | POA: Insufficient documentation

## 2019-10-26 DIAGNOSIS — Z20828 Contact with and (suspected) exposure to other viral communicable diseases: Secondary | ICD-10-CM | POA: Diagnosis not present

## 2019-10-26 DIAGNOSIS — I10 Essential (primary) hypertension: Secondary | ICD-10-CM

## 2019-10-26 DIAGNOSIS — F419 Anxiety disorder, unspecified: Secondary | ICD-10-CM | POA: Diagnosis not present

## 2019-10-26 LAB — CBC
HCT: 44.7 % (ref 36.0–46.0)
Hemoglobin: 15 g/dL (ref 12.0–15.0)
MCH: 29.6 pg (ref 26.0–34.0)
MCHC: 33.6 g/dL (ref 30.0–36.0)
MCV: 88.3 fL (ref 80.0–100.0)
Platelets: 291 10*3/uL (ref 150–400)
RBC: 5.06 MIL/uL (ref 3.87–5.11)
RDW: 12.8 % (ref 11.5–15.5)
WBC: 11.1 10*3/uL — ABNORMAL HIGH (ref 4.0–10.5)
nRBC: 0 % (ref 0.0–0.2)

## 2019-10-26 LAB — BASIC METABOLIC PANEL
Anion gap: 15 (ref 5–15)
BUN: 33 mg/dL — ABNORMAL HIGH (ref 8–23)
CO2: 22 mmol/L (ref 22–32)
Calcium: 10 mg/dL (ref 8.9–10.3)
Chloride: 97 mmol/L — ABNORMAL LOW (ref 98–111)
Creatinine, Ser: 1.01 mg/dL — ABNORMAL HIGH (ref 0.44–1.00)
GFR calc Af Amer: >60 mL/min (ref >60)
GFR calc non Af Amer: 59 mL/min — ABNORMAL LOW (ref >60)
Glucose, Bld: 141 mg/dL — ABNORMAL HIGH (ref 70–99)
Potassium: 3.7 mmol/L (ref 3.5–5.1)
Sodium: 134 mmol/L — ABNORMAL LOW (ref 135–145)

## 2019-10-26 LAB — CBG MONITORING, ED: Glucose-Capillary: 128 mg/dL — ABNORMAL HIGH (ref 70–99)

## 2019-10-26 LAB — D-DIMER, QUANTITATIVE: D-Dimer, Quant: 0.38 ug/mL-FEU (ref 0.00–0.50)

## 2019-10-26 NOTE — ED Provider Notes (Signed)
[facility] DEPT Provider Note   CSN: [id number] Arrival date & time: 10/26/19  1318     History   Chief Complaint Chief Complaint  Patient presents with   Tachycardia   Dizziness    HPI Paula Cline is a 63 y.o. female.     HPI Patient presented to the emergency room after being found to have SVT.  Patient stated she was having episodes of dizzy spells over the last several days.  The symptoms would come and go but occasionally last for half hour or longer.  She is also having episodes of fatigue.  She denied having any chest pain or shortness of breath.  Patient did not necessarily feel that her heart was racing when she was having those episodes but at 1 point her heart rate was fast when she checked it.  Patient went to see her primary care doctor today.  In the office she was noted to have tachycardia with a heart rate in the 180s. Past Medical History:  Diagnosis Date   Allergy    RHINITIS   Arthritis    Diverticulitis    Former smoker    Hypercholesteremia    Hypertension    Obesity    Thyroid disease     Patient Active Problem List   Diagnosis Date Noted   Anxiety 01/16/2017   Essential hypertension 03/28/2016   Herpes labialis 08/13/2013   Rosacea, acne 10/30/2011   Hypothyroid 10/28/2011   Obesity 10/28/2011   Allergic rhinitis, mild 10/28/2011   Arthritis 10/28/2011   Hyperlipidemia with target LDL less than 100 10/28/2011    Past Surgical History:  Procedure Laterality Date   COLONOSCOPY  2007/2008   Dr. Ardis Hughs   KNEE ARTHROSCOPY  2012   Right knee   WISDOM TOOTH EXTRACTION       OB History   No obstetric history on file.      Home Medications    Prior to Admission medications   Medication Sig Start Date End Date Taking? Authorizing Provider  ALPRAZolam (XANAX) 0.25 MG tablet TAKE ONE TABLET BY MOUTH DAILY AS NEEDED FOR ANXIETY Patient taking differently: Take 0.25 mg by mouth daily  as needed for anxiety.  10/21/19  Yes Denita Lung, MD  calcium-vitamin D (OSCAL WITH D) 500-200 MG-UNIT tablet Take 1 tablet by mouth.   Yes [provider]  celecoxib (CELEBREX) 200 MG capsule Take 1 capsule (200 mg total) by mouth daily. Reported on 01/18/2016 06/03/19  Yes Denita Lung, MD  diclofenac sodium (VOLTAREN) 1 % GEL Apply 1 application topically 2 (two) times daily as needed.   Yes [provider]  Eszopiclone 3 MG TABS TAKE ONE TABLET BY MOUTH DAILY IMMEDIATELY BEFORE BEDTIME Patient taking differently: Take 3 mg by mouth at bedtime as needed (sleep).  10/21/19  Yes Denita Lung, MD  lisinopril-hydrochlorothiazide (ZESTORETIC) 20-12.5 MG tablet Take 1 tablet by mouth daily. 06/03/19  Yes Denita Lung, MD  magnesium 30 MG tablet Take 30 mg by mouth 2 (two) times daily.   Yes [provider]  Melatonin 3 MG CAPS Take by mouth.   Yes [provider]  Multiple Vitamins-Minerals (WOMENS 50+ MULTI VITAMIN/MIN) TABS Take by mouth.   Yes [provider]  Nutritional Supplements (VITAMIN D BOOSTER PO) Take 2,000 Units by mouth.   Yes [provider]  POTASSIUM PO Take by mouth.   Yes [provider]  SYNTHROID 125 MCG tablet Take 1 tablet (125  mcg total) by mouth daily. 06/03/19  Yes Denita Lung, MD  valACYclovir (VALTREX) 1000 MG tablet TAKE 2 TABLETS BY MOUTH TWICE DAILY FOR ONE DAY; THEN AS DIRECTED BY YOUR DOCTOR Patient taking differently: Take 1,000 mg by mouth daily as needed (fever blister outbreak).  02/16/19  Yes Denita Lung, MD  Glucosamine-Chondroitin (GLUCOSAMINE CHONDR COMPLEX PO) Take 1,000 mg by mouth.    [provider]  mupirocin ointment (BACTROBAN) 2 % Apply to affected area 3 times daily for 7 days. Patient not taking: Reported on 06/03/2019 08/21/17   Girtha Rm, NP-C    Family History Family History  Problem Relation Age of Onset   Cancer Father 72       Lung cancer    Cancer Mother 86       Lung cancer   Colon cancer Neg Hx     Social History Social History   Tobacco Use   Smoking status: Former Smoker    Types: Cigarettes    Quit date: 05/22/1993    Years since quitting: 26.4   Smokeless tobacco: Never Used  Substance Use Topics   Alcohol use: Yes    Comment: rare   Drug use: No     Allergies   Hydrocodone   Review of Systems Review of Systems  All other systems reviewed and are negative.    Physical Exam Updated Vital Signs BP (!) 151/73 (BP Location: Right Arm)    Pulse 82    Temp 98.2 F (36.8 C) (Oral)    Resp 12    LMP 11/18/2010    SpO2 99%   Physical Exam Vitals signs and nursing note reviewed.  Constitutional:      General: She is not in acute distress.    Appearance: She is well-developed.  HENT:     Head: Normocephalic and atraumatic.     Right Ear: External ear normal.     Left Ear: External ear normal.  Eyes:     General: No scleral icterus.       Right eye: No discharge.        Left eye: No discharge.     Conjunctiva/sclera: Conjunctivae normal.  Neck:     Musculoskeletal: Neck supple.     Trachea: No tracheal deviation.  Cardiovascular:     Rate and Rhythm: Normal rate and regular rhythm.  Pulmonary:     Effort: Pulmonary effort is normal. No respiratory distress.     Breath sounds: Normal breath sounds. No stridor. No wheezing or rales.  Abdominal:     General: Bowel sounds are normal. There is no distension.     Palpations: Abdomen is soft.     Tenderness: There is no abdominal tenderness. There is no guarding or rebound.  Musculoskeletal:        General: No tenderness.  Skin:    General: Skin is warm and dry.     Findings: No rash.  Neurological:     Mental Status: She is alert.     Cranial Nerves: No cranial nerve deficit (no facial droop, extraocular movements intact, no slurred speech).     Sensory: No sensory deficit.     Motor: No abnormal muscle tone or seizure activity.      Coordination: Coordination normal.      ED Treatments / Results  Labs (all labs ordered are listed, but only abnormal results are displayed) Labs Reviewed  BASIC METABOLIC PANEL - Abnormal; Notable for the following components:  Result Value   Sodium 134 (*)    Chloride 97 (*)    Glucose, Bld 141 (*)    BUN 33 (*)    Creatinine, Ser 1.01 (*)    GFR calc non Af Amer 59 (*)    All other components within normal limits  CBC - Abnormal; Notable for the following components:   WBC 11.1 (*)    All other components within normal limits  CBG MONITORING, ED - Abnormal; Notable for the following components:   Glucose-Capillary 128 (*)    All other components within normal limits  TROPONIN I (HIGH SENSITIVITY) - Abnormal; Notable for the following components:   Troponin I (High Sensitivity) 135 (*)    All other components within normal limits  TROPONIN I (HIGH SENSITIVITY) - Abnormal; Notable for the following components:   Troponin I (High Sensitivity) 121 (*)    All other components within normal limits  D-DIMER, QUANTITATIVE (NOT AT [facility])    EKG EKG Interpretation  Date/Time:  Tuesday October 26 2019 13:27:32 EST Ventricular Rate:  112 PR Interval:    QRS Duration: 84 QT Interval:  344 QTC Calculation: 474 R Axis:   48 Text Interpretation: Sinus tachycardia with irregular rate Baseline wander in lead(s) I III aVR aVL No old tracing to compare Confirmed by Dorie Rank [phone]) on 10/26/2019 9:52:10 PM  2nd EKG  EKG Interpretation  Date/Time:  Tuesday October 26 2019 22:08:12 EST Ventricular Rate:  77 PR Interval:    QRS Duration: 84 QT Interval:  402 QTC Calculation: 455 R Axis:   44 Text Interpretation: Sinus rhythm Baseline wander in lead(s) V2 V4 Since last tracing rate slower Confirmed by Dorie Rank [phone]) on 10/26/2019 10:15:58 PM       Radiology Dg Chest Portable 1 View  Result Date: 10/26/2019 CLINICAL DATA:  Tachycardia, dizziness EXAM: PORTABLE CHEST  1 VIEW COMPARISON:  None. FINDINGS: No consolidation, features of edema, pneumothorax, or effusion. Pulmonary vascularity is normally distributed. The aorta is calcified. The remaining cardiomediastinal contours are unremarkable. No acute osseous or soft tissue abnormality. IMPRESSION: No acute cardiopulmonary abnormality. Aortic Atherosclerosis (ICD10-I70.0). Electronically Signed   By: Lovena Le M.D.   On: 10/26/2019 22:23    Procedures Procedures (including critical care time)  Medications Ordered in ED Medications - No data to display   Initial Impression / Assessment and Plan / ED Course  I have reviewed the triage vital signs and the nursing notes.  Pertinent labs & imaging results that were available during my care of the patient were reviewed by me and considered in my medical decision making (see chart for details).  Clinical Course as of Oct 27 107  Tue Oct 26, 2019  2209 EKG from the office today reviewed.  She had a narrow complex tachycardia with a heart rate of 185   [JK]  Wed Oct 27, 2019  0103 D/w Dr Marletta Lor.  Would recommend obs, cardiology will consult.  EP will evaluate pt in the am   [JK]    Clinical Course User Index [JK] Dorie Rank, MD     Patient presented to the ED for evaluation of lightheadedness and episodes of tachycardia.  Patient was noted to have SVT with a heart rate in the 180s.  Patient has remained stable in the ED.  She does have an elevated high-sensitivity troponin.  I was concerned about the stability of myocarditis but I suspect this is more related to the degree of tachycardia.  Sx not suggestive of  ACS.  Patient would benefit from echocardiogram, cardiology consultation.  Plan on admission with cardiology evaluation.  Final Clinical Impressions(s) / ED Diagnoses   Final diagnoses:  SVT (supraventricular tachycardia) ([place])  Elevated troponin      Dorie Rank, MD 10/27/19 [phone]

## 2019-10-26 NOTE — Progress Notes (Signed)
   Subjective:    Patient ID: Paula Cline, female    DOB: 1956-08-14, 63 y.o.   MRN: [id number]  HPI She is here for consult concerning continued difficulty with dizzy spells.  She initially described the dizzy spells as occurring if she would bend over and then stand up and the symptoms would go away relatively quickly.  She now states that sometimes it can last a half an hour or longer.  She also complains of increased fatigue.  She has had no chest pain, shortness of breath, PND or DOE.  She does state that on several occasions when she was dizzy she did check her heart rate and it was fast but did not actually measure it. She does have hypertension and is on an ACE inhibitor.  She is also taking thyroid medication.  Review of Systems     Objective:   Physical Exam Alert and in no distress. Tympanic membranes and canals are normal. Pharyngeal area is normal. Neck is supple without adenopathy or thyromegaly. Cardiac exam shows a rapid rate of 185.. Lungs are clear to auscultation. EKG does show SVT.      Assessment & Plan:  SVT (supraventricular tachycardia) (HCC) - Plan: CBC with Differential, Comprehensive metabolic panel, TSH  Hypothyroidism, unspecified type - Plan: TSH  Essential hypertension - Plan: CBC with Differential, Comprehensive metabolic panel Case was discussed with Dr. Ellouise Newer and he recommended sending her to the emergency room.

## 2019-10-26 NOTE — Addendum Note (Signed)
Addended by: Elyse Jarvis on: 10/26/2019 12:50 PM   Modules accepted: Orders

## 2019-10-26 NOTE — ED Triage Notes (Signed)
Pt sent from doctor for HR 180s. Pt states her HR not that high now. Pt been dizzy "for a while now that have exhacerbated in past week".

## 2019-10-26 NOTE — ED Notes (Signed)
Pt ambulated to the bathroom without assistance. Gait steady  

## 2019-10-27 ENCOUNTER — Observation Stay (HOSPITAL_BASED_OUTPATIENT_CLINIC_OR_DEPARTMENT_OTHER): Payer: BC Managed Care – PPO

## 2019-10-27 ENCOUNTER — Encounter (HOSPITAL_COMMUNITY): Payer: Self-pay | Admitting: Cardiology

## 2019-10-27 DIAGNOSIS — R778 Other specified abnormalities of plasma proteins: Secondary | ICD-10-CM | POA: Diagnosis not present

## 2019-10-27 DIAGNOSIS — E039 Hypothyroidism, unspecified: Secondary | ICD-10-CM

## 2019-10-27 DIAGNOSIS — I471 Supraventricular tachycardia, unspecified: Secondary | ICD-10-CM

## 2019-10-27 DIAGNOSIS — I10 Essential (primary) hypertension: Secondary | ICD-10-CM | POA: Diagnosis not present

## 2019-10-27 DIAGNOSIS — R7989 Other specified abnormal findings of blood chemistry: Secondary | ICD-10-CM

## 2019-10-27 HISTORY — DX: Supraventricular tachycardia: I47.1

## 2019-10-27 HISTORY — DX: Supraventricular tachycardia, unspecified: I47.10

## 2019-10-27 LAB — COMPREHENSIVE METABOLIC PANEL
ALT: 15 IU/L (ref 0–32)
AST: 22 IU/L (ref 0–40)
Albumin/Globulin Ratio: 1.7 (ref 1.2–2.2)
Albumin: 4.6 g/dL (ref 3.8–4.8)
Alkaline Phosphatase: 92 IU/L (ref 39–117)
BUN/Creatinine Ratio: 29 — ABNORMAL HIGH (ref 12–28)
BUN: 32 mg/dL — ABNORMAL HIGH (ref 8–27)
Bilirubin Total: 0.4 mg/dL (ref 0.0–1.2)
CO2: 20 mmol/L (ref 20–29)
Calcium: 10.4 mg/dL — ABNORMAL HIGH (ref 8.7–10.3)
Chloride: 96 mmol/L (ref 96–106)
Creatinine, Ser: 1.09 mg/dL — ABNORMAL HIGH (ref 0.57–1.00)
GFR calc Af Amer: 62 mL/min/{1.73_m2} (ref >59)
GFR calc non Af Amer: 54 mL/min/{1.73_m2} — ABNORMAL LOW (ref >59)
Globulin, Total: 2.7 g/dL (ref 1.5–4.5)
Glucose: 163 mg/dL — ABNORMAL HIGH (ref 65–99)
Potassium: 4.7 mmol/L (ref 3.5–5.2)
Sodium: 135 mmol/L (ref 134–144)
Total Protein: 7.3 g/dL (ref 6.0–8.5)

## 2019-10-27 LAB — MAGNESIUM: Magnesium: 2.2 mg/dL (ref 1.7–2.4)

## 2019-10-27 LAB — CBC WITH DIFFERENTIAL/PLATELET
Basophils Absolute: 0.1 10*3/uL (ref 0.0–0.2)
Basos: 1 %
EOS (ABSOLUTE): 0.1 10*3/uL (ref 0.0–0.4)
Eos: 1 %
Hematocrit: 46.7 % — ABNORMAL HIGH (ref 34.0–46.6)
Hemoglobin: 15.5 g/dL (ref 11.1–15.9)
Immature Grans (Abs): 0 10*3/uL (ref 0.0–0.1)
Immature Granulocytes: 0 %
Lymphocytes Absolute: 2.5 10*3/uL (ref 0.7–3.1)
Lymphs: 23 %
MCH: 29.4 pg (ref 26.6–33.0)
MCHC: 33.2 g/dL (ref 31.5–35.7)
MCV: 89 fL (ref 79–97)
Monocytes Absolute: 0.7 10*3/uL (ref 0.1–0.9)
Monocytes: 7 %
Neutrophils Absolute: 7.4 10*3/uL — ABNORMAL HIGH (ref 1.4–7.0)
Neutrophils: 68 %
Platelets: 312 10*3/uL (ref 150–450)
RBC: 5.27 x10E6/uL (ref 3.77–5.28)
RDW: 12.2 % (ref 11.7–15.4)
WBC: 10.8 10*3/uL (ref 3.4–10.8)

## 2019-10-27 LAB — SARS CORONAVIRUS 2 (TAT 6-24 HRS): SARS Coronavirus 2: NEGATIVE

## 2019-10-27 LAB — ECHOCARDIOGRAM COMPLETE
Height: 62 in
Weight: 3364.8 oz

## 2019-10-27 LAB — TROPONIN I (HIGH SENSITIVITY)
Troponin I (High Sensitivity): 121 ng/L (ref <18)
Troponin I (High Sensitivity): 135 ng/L (ref <18)

## 2019-10-27 LAB — TSH
TSH: 1.45 u[IU]/mL (ref 0.450–4.500)
TSH: 1.873 u[IU]/mL (ref 0.350–4.500)

## 2019-10-27 LAB — HIV ANTIBODY (ROUTINE TESTING W REFLEX): HIV Screen 4th Generation wRfx: NONREACTIVE

## 2019-10-27 MED ORDER — HYDROCHLOROTHIAZIDE 12.5 MG PO CAPS
12.5000 mg | ORAL_CAPSULE | Freq: Every day | ORAL | Status: DC
  Administered 2019-10-27: 12.5 mg via ORAL
  Filled 2019-10-27: qty 1

## 2019-10-27 MED ORDER — ONDANSETRON HCL 4 MG PO TABS: 4.0000 mg | ORAL_TABLET | Freq: Four times a day (QID) | ORAL | Status: DC | PRN

## 2019-10-27 MED ORDER — LISINOPRIL 20 MG PO TABS
20.0000 mg | ORAL_TABLET | Freq: Every day | ORAL | Status: DC
  Administered 2019-10-27: 20 mg via ORAL
  Filled 2019-10-27: qty 1

## 2019-10-27 MED ORDER — CALCIUM CARBONATE-VITAMIN D 500-200 MG-UNIT PO TABS
1.0000 | ORAL_TABLET | Freq: Every day | ORAL | Status: DC
  Administered 2019-10-27: 1 via ORAL
  Filled 2019-10-27 (×2): qty 1

## 2019-10-27 MED ORDER — METOPROLOL TARTRATE 25 MG PO TABS: 25.0000 mg | ORAL_TABLET | Freq: Two times a day (BID) | ORAL | 0 refills | Status: DC

## 2019-10-27 MED ORDER — MAGNESIUM 30 MG PO TABS: 30.0000 mg | ORAL_TABLET | Freq: Two times a day (BID) | ORAL | Status: DC

## 2019-10-27 MED ORDER — LEVOTHYROXINE SODIUM 25 MCG PO TABS
125.0000 ug | ORAL_TABLET | Freq: Every day | ORAL | Status: DC
  Administered 2019-10-27: 125 ug via ORAL
  Filled 2019-10-27 (×2): qty 1

## 2019-10-27 MED ORDER — ACETAMINOPHEN 325 MG PO TABS
650.0000 mg | ORAL_TABLET | Freq: Four times a day (QID) | ORAL | Status: DC | PRN
  Administered 2019-10-27: 650 mg via ORAL
  Filled 2019-10-27: qty 2

## 2019-10-27 MED ORDER — ENOXAPARIN SODIUM 40 MG/0.4ML ~~LOC~~ SOLN
40.0000 mg | SUBCUTANEOUS | Status: DC
  Administered 2019-10-27: 40 mg via SUBCUTANEOUS
  Filled 2019-10-27: qty 0.4

## 2019-10-27 MED ORDER — METOPROLOL TARTRATE 25 MG PO TABS
25.0000 mg | ORAL_TABLET | Freq: Two times a day (BID) | ORAL | Status: DC
  Administered 2019-10-27: 25 mg via ORAL
  Filled 2019-10-27: qty 1

## 2019-10-27 MED ORDER — POTASSIUM CHLORIDE CRYS ER 20 MEQ PO TBCR
40.0000 meq | EXTENDED_RELEASE_TABLET | Freq: Once | ORAL | Status: AC
  Administered 2019-10-27: 40 meq via ORAL
  Filled 2019-10-27: qty 2

## 2019-10-27 MED ORDER — ONDANSETRON HCL 4 MG/2ML IJ SOLN: 4.0000 mg | Freq: Four times a day (QID) | INTRAMUSCULAR | Status: DC | PRN

## 2019-10-27 MED ORDER — CELECOXIB 200 MG PO CAPS
200.0000 mg | ORAL_CAPSULE | Freq: Every day | ORAL | Status: DC
  Administered 2019-10-27: 200 mg via ORAL
  Filled 2019-10-27: qty 1

## 2019-10-27 MED ORDER — MELATONIN 3 MG PO TABS: 3.0000 mg | ORAL_TABLET | Freq: Every evening | ORAL | Status: DC | PRN

## 2019-10-27 MED ORDER — ACETAMINOPHEN 650 MG RE SUPP: 650.0000 mg | Freq: Four times a day (QID) | RECTAL | Status: DC | PRN

## 2019-10-27 MED ORDER — LISINOPRIL-HYDROCHLOROTHIAZIDE 20-12.5 MG PO TABS: 1.0000 | ORAL_TABLET | Freq: Every day | ORAL | Status: DC

## 2019-10-27 MED ORDER — ALPRAZOLAM 0.25 MG PO TABS: 0.2500 mg | ORAL_TABLET | Freq: Every day | ORAL | Status: DC | PRN

## 2019-10-27 NOTE — Progress Notes (Signed)
Covid status: pending-PPE continues.

## 2019-10-27 NOTE — H&P (Signed)
History and Physical    Paula Cline [id number] DOB: [birth year] DOA: 10/26/2019  PCP: Denita Lung, MD  Patient coming from: Home  I have personally briefly reviewed patient's old medical records in [place]  Chief Complaint: Tachycardia  HPI: [name] is a 63 y.o. female with medical history significant of HTN, hypothyroidism.  Patient presents to ED with c/o palpitaitons.  Was seen at PCPs office, found to have SVT with HR 185 on EKG.  Symptoms would come and go but occasionally lasting for half hour or longer.  No CP, no SOB.   ED Course: Asymptomatic at this time and SVT is resolved, pt in NSR.  Trops were 135 and 121 (presumably demand).  EDP spoke with cards who wanted ON obs admit, 2d echo, and they will see in AM.   Review of Systems: As per HPI, otherwise all review of systems negative.  Past Medical History:  Diagnosis Date  . Allergy    RHINITIS  . Arthritis   . Diverticulitis   . Former smoker   . Hypercholesteremia   . Hypertension   . Obesity   . Thyroid disease     Past Surgical History:  Procedure Laterality Date  . COLONOSCOPY  2007/2008   Dr. Ardis Hughs  . KNEE ARTHROSCOPY  2012   Right knee  . WISDOM TOOTH EXTRACTION       reports that she quit smoking about 26 years ago. Her smoking use included cigarettes. She has never used smokeless tobacco. She reports current alcohol use. She reports that she does not use drugs.  Allergies  Allergen Reactions  . Hydrocodone Itching    Family History  Problem Relation Age of Onset  . Cancer Father 110       Lung cancer  . Cancer Mother 24       Lung cancer  . Colon cancer Neg Hx      Prior to Admission medications   Medication Sig Start Date End Date Taking? Authorizing Provider  ALPRAZolam (XANAX) 0.25 MG tablet TAKE ONE TABLET BY MOUTH DAILY AS NEEDED FOR ANXIETY Patient taking differently: Take 0.25 mg by mouth daily as needed for anxiety.  10/21/19  Yes Denita Lung,  MD  calcium-vitamin D (OSCAL WITH D) 500-200 MG-UNIT tablet Take 1 tablet by mouth.   Yes [provider]  celecoxib (CELEBREX) 200 MG capsule Take 1 capsule (200 mg total) by mouth daily. Reported on 01/18/2016 06/03/19  Yes Denita Lung, MD  diclofenac sodium (VOLTAREN) 1 % GEL Apply 1 application topically 2 (two) times daily as needed.   Yes [provider]  Eszopiclone 3 MG TABS TAKE ONE TABLET BY MOUTH DAILY IMMEDIATELY BEFORE BEDTIME Patient taking differently: Take 3 mg by mouth at bedtime as needed (sleep).  10/21/19  Yes Denita Lung, MD  lisinopril-hydrochlorothiazide (ZESTORETIC) 20-12.5 MG tablet Take 1 tablet by mouth daily. 06/03/19  Yes Denita Lung, MD  magnesium 30 MG tablet Take 30 mg by mouth 2 (two) times daily.   Yes [provider]  Melatonin 3 MG CAPS Take by mouth.   Yes [provider]  Multiple Vitamins-Minerals (WOMENS 50+ MULTI VITAMIN/MIN) TABS Take by mouth.   Yes [provider]  Nutritional Supplements (VITAMIN D BOOSTER PO) Take 2,000 Units by mouth.   Yes [provider]  POTASSIUM PO Take by mouth.   Yes [provider]  SYNTHROID 125 MCG tablet Take 1 tablet (125 mcg total) by mouth  daily. 06/03/19  Yes Denita Lung, MD  valACYclovir (VALTREX) 1000 MG tablet TAKE 2 TABLETS BY MOUTH TWICE DAILY FOR ONE DAY; THEN AS DIRECTED BY YOUR DOCTOR Patient taking differently: Take 1,000 mg by mouth daily as needed (fever blister outbreak).  02/16/19  Yes Denita Lung, MD  Glucosamine-Chondroitin (GLUCOSAMINE CHONDR COMPLEX PO) Take 1,000 mg by mouth.    [provider]  mupirocin ointment (BACTROBAN) 2 % Apply to affected area 3 times daily for 7 days. Patient not taking: Reported on 06/03/2019 08/21/17   Girtha Rm, NP-C    Physical Exam: Vitals:   10/26/19 1329 10/26/19 1936 10/26/19 2200 10/27/19 0124  BP: (!) 115/98 (!) 141/90 (!) 151/73 137/86  Pulse: 100 81 82 75  Resp: 18 17  12 14   Temp: 97.6 F (36.4 C) 98.2 F (36.8 C)    TempSrc: Oral Oral    SpO2: 100% 100% 99% 98%    Constitutional: NAD, calm, comfortable Eyes: PERRL, lids and conjunctivae normal ENMT: Mucous membranes are moist. Posterior pharynx clear of any exudate or lesions.Normal dentition.  Neck: normal, supple, no masses, no thyromegaly Respiratory: clear to auscultation bilaterally, no wheezing, no crackles. Normal respiratory effort. No accessory muscle use.  Cardiovascular: Regular rate and rhythm, no murmurs / rubs / gallops. No extremity edema. 2+ pedal pulses. No carotid bruits.  Abdomen: no tenderness, no masses palpated. No hepatosplenomegaly. Bowel sounds positive.  Musculoskeletal: no clubbing / cyanosis. No joint deformity upper and lower extremities. Good ROM, no contractures. Normal muscle tone.  Skin: no rashes, lesions, ulcers. No induration Neurologic: CN 2-12 grossly intact. Sensation intact, DTR normal. Strength 5/5 in all 4.  Psychiatric: Normal judgment and insight. Alert and oriented x 3. Normal mood.    Labs on Admission: I have personally reviewed following labs and imaging studies  CBC: Recent Labs  Lab 10/26/19 1100 10/26/19 1342  WBC 10.8 11.1*  NEUTROABS 7.4*  --   HGB 15.5 15.0  HCT 46.7* 44.7  MCV 89 88.3  PLT 312 [id number]   Basic Metabolic Panel: Recent Labs  Lab 10/26/19 1100 10/26/19 1342  NA 135 134*  K 4.7 3.7  CL 96 97*  CO2 20 22  GLUCOSE 163* 141*  BUN 32* 33*  CREATININE 1.09* 1.01*  CALCIUM 10.4* 10.0   GFR: Estimated Creatinine Clearance: 61.1 mL/min (A) (by C-G formula based on SCr of 1.01 mg/dL (H)). Liver Function Tests: Recent Labs  Lab 10/26/19 1100  AST 22  ALT 15  ALKPHOS 92  BILITOT 0.4  PROT 7.3  ALBUMIN 4.6   No results for input(s): LIPASE, AMYLASE in the last 168 hours. No results for input(s): AMMONIA in the last 168 hours. Coagulation Profile: No results for input(s): INR, PROTIME in the last 168 hours. Cardiac  Enzymes: No results for input(s): CKTOTAL, CKMB, CKMBINDEX, TROPONINI in the last 168 hours. BNP (last 3 results) No results for input(s): PROBNP in the last 8760 hours. HbA1C: No results for input(s): HGBA1C in the last 72 hours. CBG: Recent Labs  Lab 10/26/19 1329  GLUCAP 128*   Lipid Profile: No results for input(s): CHOL, HDL, LDLCALC, TRIG, CHOLHDL, LDLDIRECT in the last 72 hours. Thyroid Function Tests: Recent Labs    10/26/19 1100  TSH 1.450   Anemia Panel: No results for input(s): VITAMINB12, FOLATE, FERRITIN, TIBC, IRON, RETICCTPCT in the last 72 hours. Urine analysis:    Component Value Date/Time   LABSPEC 1.015 02/09/2018 1235   BILIRUBINUR negative 02/09/2018 1235  BILIRUBINUR n 01/16/2017 0916   KETONESUR negative 02/09/2018 1235   PROTEINUR negative 02/09/2018 1235   PROTEINUR n 01/16/2017 0916   UROBILINOGEN negative 01/16/2017 0916   NITRITE Negative 02/09/2018 1235   NITRITE n 01/16/2017 0916   LEUKOCYTESUR Negative 02/09/2018 1235    Radiological Exams on Admission: Dg Chest Portable 1 View  Result Date: 10/26/2019 CLINICAL DATA:  Tachycardia, dizziness EXAM: PORTABLE CHEST 1 VIEW COMPARISON:  None. FINDINGS: No consolidation, features of edema, pneumothorax, or effusion. Pulmonary vascularity is normally distributed. The aorta is calcified. The remaining cardiomediastinal contours are unremarkable. No acute osseous or soft tissue abnormality. IMPRESSION: No acute cardiopulmonary abnormality. Aortic Atherosclerosis (ICD10-I70.0). Electronically Signed   By: Lovena Le M.D.   On: 10/26/2019 22:23    EKG: Independently reviewed.  Assessment/Plan Principal Problem:   SVT (supraventricular tachycardia) (HCC) Active Problems:   Hypothyroid   Essential hypertension    1. SVT - 1. Currently NSR 2. Cards wants ON obs admit, 2d echo, and they will see in AM 2. Hypothyroidism - 1. Continue synthroid 2. Check TSH 3. HTN - 1. Continue lisinopril /  HCTZ  DVT prophylaxis: Lovenox Code Status: Full Family Communication: No family in room Disposition Plan: Home after admit Consults called: EDP spoke with Cards on call Admission status: Place in obs   Hadasa Gasner, [place] [facility]  How to contact the [facility] Attending or Consulting provider [place] or covering provider during after hours [place], for this patient?  1. Check the care team in [facility] and look for a) attending/consulting [facility] provider listed and b) the [facility] team listed 2. Log into [email or web address]  Amion Physician Scheduling and messaging for groups and whole hospitals  On call and physician scheduling software for group practices, residents, hospitalists and other medical providers for call, clinic, rotation and shift schedules. On[employer] is a hospital-wide system for scheduling doctors and paging doctors on call. EasyPlot is for scientific plotting and data analysis.  [email or web address]  and use [facility]'s universal password to access. If you do not have the password, please contact the hospital operator.  3. Locate the [facility] provider you are looking for under [facility] and page to a number that you can be directly reached. 4. If you still have difficulty reaching the provider, please page the [facility] (Director on Call) for the Hospitalists listed on amion for assistance.  10/27/2019, 1:28 AM

## 2019-10-27 NOTE — Discharge Instructions (Signed)
Supraventricular Tachycardia, Adult Supraventricular tachycardia (SVT) is a kind of abnormal heartbeat. It makes your heart beat very fast and then beat at a normal speed. A normal resting heartbeat is 60-100 times a minute. This condition can make your heart beat more than 150 times a minute. Times of having a fast heartbeat (episodes) can be scary, but they are usually not dangerous. In some cases, they may lead to heart failure if:  They happen many times per day.  Last longer than a few seconds. What are the causes?  A normal heartbeat starts when an area called the sinoatrial node sends out an electrical signal. In SVT, other areas of the heart send out signals that get in the way of the signal from the sinoatrial node. What increases the risk? You are more likely to develop this condition if you are:  20-68 years old.  A woman. The following factors may make you more likely to develop this condition:  Stress.  Tiredness.  Smoking.  Stimulant drugs, such as cocaine and methamphetamine.  Alcohol.  Caffeine.  Pregnancy.  Feeling worried or nervous (anxiety). What are the signs or symptoms?  A pounding heart.  A feeling that your heart is skipping beats (palpitations).  Weakness.  Trouble getting enough air.  Pain or tightness in your chest.  Feeling like you are going to pass out (faint).  Feeling worried or nervous.  Dizziness.  Sweating.  Feeling sick to your stomach (nausea).  Passing out.  Tiredness. Sometimes, there are no symptoms. How is this treated?  Vagal nerve stimulation. Ways to do this include: ? Holding your breath and pushing, as though you are pooping (having a bowel movement). ? Massaging an area on one side of your neck. Do not try this yourself. Only a doctor should do this. If done the wrong way, it can lead to a stroke. ? Bending forward with your head between your legs. ? Coughing while bending forward with your head between  your legs. ? Closing your eyes and massaging your eyeballs. Ask a doctor how to do this.  Medicines that prevent attacks.  Medicine to stop an attack given through an IV tube at the hospital.  A small electric shock (cardioversion) that stops an attack.  Radiofrequency ablation. In this procedure, a small, thin tube (catheter) is used to send energy to the area that is causing the rapid heartbeats. If you do not have symptoms, you may not need treatment. Follow these instructions at home: Stress  Avoid things that make you feel stressed.  To deal with stress, try: ? Doing yoga or meditation, or being out in nature. ? Listening to relaxing music. ? Doing deep breathing. ? Taking steps to be healthy, such as getting lots of sleep, exercising, and eating a balanced diet. ? Talking with a mental health doctor. Lifestyle   Try to get at least 7 hours of sleep each night.  Do not use any products that contain nicotine or tobacco, such as cigarettes, e-cigarettes, and chewing tobacco. If you need help quitting, ask your doctor.  Be aware of how alcohol affects you. ? If alcohol gives you a fast heartbeat, do not drink alcohol. ? If alcohol does not seem to give you a fast heartbeat, limit alcohol use to no more than 1 drink a day for women who are not pregnant, and 2 drinks a day for men. In the [place] one drink is one of these: ? 12 oz of beer (355 mL). ? 5  oz of wine (148 mL). ? 1 oz of hard liquor (44 mL).  Be aware of how caffeine affects you. ? If caffeine gives you a fast heartbeat, do not eat, drink, or use anything with caffeine in it. ? If caffeine does not seem to give you a fast heartbeat, limit how much caffeine you eat, drink, or use.  Do not use stimulant drugs. If you need help quitting, ask your doctor. General instructions  Stay at a healthy weight.  Exercise regularly. Ask your doctor about good activities for you. Try one or a mixture of these: ? 150 minutes  a week of gentle exercise, like walking or yoga. ? 75 minutes a week of exercise that is very active, like running or swimming.  Do vagus nerve treatments to slow down your heartbeat as told by your doctor.  Take over-the-counter and prescription medicines only as told by your doctor.  Keep all follow-up visits as told by your doctor. This is important. Contact a doctor if:  You have a fast heartbeat more often.  Times of having a fast heartbeat last longer than before.  Home treatments to slow down your heartbeat do not help.  You have new symptoms. Get help right away if:  You have chest pain.  Your symptoms get worse.  You have trouble breathing.  Your heart beats very fast for more than 20 minutes.  You pass out. These symptoms may be an emergency. Do not wait to see if the symptoms will go away. Get medical help right away. Call your local emergency services (911 in the [place] Do not drive yourself to the hospital. Summary  SVT is a type of abnormal heart beat.  This condition can make your heart beat more than 150 times a minute.  Treatment depends on how often the condition happens and your symptoms. This information is not intended to replace advice given to you by your health care provider. Make sure you discuss any questions you have with your health care provider. Document Released: 11/04/2005 Document Revised: 09/22/2018 Document Reviewed: 09/22/2018 Elsevier Patient Education  2020 [employer].

## 2019-10-27 NOTE — Discharge Summary (Signed)
Paula Cline, is a 63 y.o. female  DOB Oct 16, 1956  MRN [id number].  Admission date:  10/26/2019  Admitting Physician  Etta Quill, DO  Discharge Date:  10/27/2019   Primary MD  Denita Lung, MD  Recommendations for primary care physician for things to follow:  -Please check CMP during next visit -Patient to follow with electrophysiology cardiology as an outpatient, appointment to be arranged by see [facility]   Admission Diagnosis  SVT (supraventricular tachycardia) ([place]) [I47.1] Elevated troponin [R77.8]   Discharge Diagnosis  SVT (supraventricular tachycardia) ([place]) [I47.1] Elevated troponin [R77.8]    Principal Problem:   SVT (supraventricular tachycardia) ([facility] [place]) Active Problems:   Hypothyroid   Essential hypertension   Elevated troponin      Past Medical History:  Diagnosis Date  . Allergy    RHINITIS  . Arthritis   . Diverticulitis   . Former smoker   . Hypercholesteremia   . Hypertension   . Obesity   . Thyroid disease     Past Surgical History:  Procedure Laterality Date  . COLONOSCOPY  2007/2008   Dr. Ardis Hughs  . KNEE ARTHROSCOPY  2012   Right knee  . WISDOM TOOTH EXTRACTION         History of present illness and  Hospital Course:     Kindly see H&P for history of present illness and admission details, please review complete Labs, Consult reports and Test reports for all details in brief  HPI  from the history and physical done on the day of admission 10/27/2019  HPI: Paula Cline is a 63 y.o. female with medical history significant of HTN, hypothyroidism.  Patient presents to ED with c/o palpitaitons.  Was seen at PCPs office, found to have SVT with HR 185 on EKG.  Symptoms would come and go but occasionally lasting for half hour or longer.  No CP, no SOB.   ED Course: Asymptomatic at this time and SVT is resolved, pt in NSR.  Trops were 135 and 121  (presumably demand).  EDP spoke with cards who wanted ON obs admit, 2d echo, and they will see in AM.  Hospital Course    SVT -Patient reports several month of similar episodes of palpitation, associated with dizziness. -Potassium and magnesium within normal limit. -Sensitivity troponins mildly elevated, but flat pattern, most likely in the setting of demand ischemia from SVT. -TSH within normal limit, -Cardiology input greatly appreciated, she will be started on metoprolol 25 mg p.o. twice daily, cardiology will arrange outpatient follow-up with EP, as likely she will need ablation as she is not interested in taking medication long-term.  Hypothyroidism -TSH 1.8, continue with Synthroid  Hypertension -Continue with lisinopril/hydrochlorothiazide.  Discharge Condition:stable   Follow UP  Follow-up Information    [facility].   Specialty: Cardiology Why: You will be called to schedule an appointment with an electrophysiologist/heart specialist. Please aswer your phone for unknown numbers (Should be a [phone]) Contact information: [address], [employer]  [place] [phone]            Discharge Instructions  and  Discharge Medications     Discharge Instructions    Diet - low sodium heart healthy   Complete by: As directed    Increase activity slowly   Complete by: As directed      Allergies as of 10/27/2019      Reactions   Hydrocodone Itching      Medication List    TAKE these medications   ALPRAZolam 0.25 MG tablet Commonly known as: XANAX TAKE ONE TABLET BY MOUTH DAILY AS NEEDED FOR ANXIETY What changed: See the new instructions.   calcium-vitamin D 500-200 MG-UNIT tablet Commonly known as: OSCAL WITH D Take 1 tablet by mouth.   celecoxib 200 MG capsule Commonly known as: CELEBREX Take 1 capsule (200 mg total) by mouth daily. Reported on 01/18/2016   Eszopiclone 3 MG Tabs TAKE ONE  TABLET BY MOUTH DAILY IMMEDIATELY BEFORE BEDTIME What changed:   how much to take  how to take this  when to take this  reasons to take this  additional instructions   GLUCOSAMINE CHONDR COMPLEX PO Take 1,000 mg by mouth.   lisinopril-hydrochlorothiazide 20-12.5 MG tablet Commonly known as: ZESTORETIC Take 1 tablet by mouth daily.   magnesium 30 MG tablet Take 30 mg by mouth 2 (two) times daily.   Melatonin 3 MG Caps Take by mouth.   metoprolol tartrate 25 MG tablet Commonly known as: LOPRESSOR Take 1 tablet (25 mg total) by mouth 2 (two) times daily. Start taking on: October 28, 2019   POTASSIUM PO Take by mouth.   Synthroid 125 MCG tablet Generic drug: levothyroxine Take 1 tablet (125 mcg total) by mouth daily.   valACYclovir 1000 MG tablet Commonly known as: VALTREX TAKE 2 TABLETS BY MOUTH TWICE DAILY FOR ONE DAY; THEN AS DIRECTED BY YOUR DOCTOR What changed:   how much to take  how to take this  when to take this  reasons to take this  additional instructions   VITAMIN D BOOSTER PO Take 2,000 Units by mouth.   Voltaren 1 % Gel Generic drug: diclofenac sodium Apply 1 application topically 2 (two) times daily as needed.   Womens 50+ Multi Vitamin/Min Tabs Take by mouth.         Diet and Activity recommendation: See Discharge Instructions above   Consults obtained -  cardiolgoy   Major procedures and Radiology Reports - PLEASE review detailed and final reports for all details, in brief -      Dg Chest Portable 1 View  Result Date: 10/26/2019 CLINICAL DATA:  Tachycardia, dizziness EXAM: PORTABLE CHEST 1 VIEW COMPARISON:  None. FINDINGS: No consolidation, features of edema, pneumothorax, or effusion. Pulmonary vascularity is normally distributed. The aorta is calcified. The remaining cardiomediastinal contours are unremarkable. No acute osseous or soft tissue abnormality. IMPRESSION: No acute cardiopulmonary abnormality. Aortic  Atherosclerosis (ICD10-I70.0). Electronically Signed   By: Lovena Le M.D.   On: 10/26/2019 22:23    Micro Results    Recent Results (from the past 240 hour(s))  SARS CORONAVIRUS 2 (TAT 6-24 HRS) Nasopharyngeal Nasopharyngeal Swab     Status: None   Collection Time: 10/27/19  1:30 AM   Specimen: Nasopharyngeal Swab  Result Value Ref Range Status   SARS Coronavirus 2 NEGATIVE NEGATIVE Final    Comment: (NOTE) SARS-CoV-2 target nucleic acids are NOT DETECTED. The SARS-CoV-2 RNA is generally detectable in upper and lower respiratory  specimens during the acute phase of infection. Negative results do not preclude SARS-CoV-2 infection, do not rule out co-infections with other pathogens, and should not be used as the sole basis for treatment or other patient management decisions. Negative results must be combined with clinical observations, patient history, and epidemiological information. The expected result is Negative. Fact Sheet for Patients: SugarRoll.be Fact Sheet for Healthcare Providers: https://www.woods-mathews.com/ This test is not yet approved or cleared by the [place] FDA and  has been authorized for detection and/or diagnosis of SARS-CoV-2 by FDA under an Emergency Use Authorization (EUA). This EUA will remain  in effect (meaning this test can be used) for the duration of the COVID-19 declaration under Section 56 4(b)(1) of the Act, 21 U.S.C. section 360bbb-3(b)(1), unless the authorization is terminated or revoked sooner. Performed at [facility], [place] [address], [place], [place] [address]        Today   Subjective:   Paula Cline today has no headache,no chest abdominal pain,no new weakness tingling or numbness, feels much better wants to go home today.   Objective:   Blood pressure 117/65, pulse 64, temperature 98 F (36.7 C), temperature source Oral, resp. rate 18, height 5\' 2"  (1.575 m), weight 95.4  kg, last menstrual period 11/18/2010, SpO2 99 %.   Intake/Output Summary (Last 24 hours) at 10/27/2019 1651 Last data filed at 10/27/2019 0800 Gross per 24 hour  Intake 240 ml  Output -  Net 240 ml    Exam Awake Alert, Oriented x 3, No new F.N deficits, Normal affect Symmetrical Chest wall movement, Good air movement bilaterally, CTAB RRR,No Gallops,Rubs or new Murmurs, No Parasternal Heave +ve B.Sounds, Abd Soft, Non tender, No organomegaly appriciated, No rebound -guarding or rigidity. No Cyanosis, Clubbing or edema, No new Rash or bruise  Data Review   CBC w Diff:  Lab Results  Component Value Date   WBC 11.1 (H) 10/26/2019   HGB 15.0 10/26/2019   HGB 15.5 10/26/2019   HCT 44.7 10/26/2019   HCT 46.7 (H) 10/26/2019   PLT 291 10/26/2019   PLT 312 10/26/2019   LYMPHOPCT 32 01/16/2017   MONOPCT 8 01/16/2017   EOSPCT 2 01/16/2017   BASOPCT 1 01/16/2017    CMP:  Lab Results  Component Value Date   NA 134 (L) 10/26/2019   NA 135 10/26/2019   K 3.7 10/26/2019   CL 97 (L) 10/26/2019   CO2 22 10/26/2019   BUN 33 (H) 10/26/2019   BUN 32 (H) 10/26/2019   CREATININE 1.01 (H) 10/26/2019   CREATININE 0.86 01/16/2017   PROT 7.3 10/26/2019   ALBUMIN 4.6 10/26/2019   BILITOT 0.4 10/26/2019   ALKPHOS 92 10/26/2019   AST 22 10/26/2019   ALT 15 10/26/2019  .   Total Time in preparing paper work, data evaluation and todays exam - 40 minutes  Phillips Climes M.D on 10/27/2019 at 4:51 PM  [facility]   Office  [phone]

## 2019-10-27 NOTE — Progress Notes (Signed)
  Echocardiogram 2D Echocardiogram has been performed.  Paula Cline 10/27/2019, 2:12 PM

## 2019-10-27 NOTE — ED Notes (Signed)
Admitting MD at bedside.

## 2019-10-27 NOTE — Consult Note (Signed)
Cardiology Consultation:   Patient ID: Paula Cline MRN: [id number]; DOB: 1956-02-27  Admit date: 10/26/2019 Date of Consult: 10/27/2019  Primary Care Provider: Denita Lung, MD Primary Cardiologist: Skeet Latch, MD - New Primary Electrophysiologist:  None    Patient Profile:   Paula Cline is a 63 y.o. female with a hx of hypertension, hyperlipidemia, former smoker, hypothyroidism, obesity who is being seen today for the evaluation of SVT at the request of Dr. Waldron Labs.  History of Present Illness:   Paula Cline has a past medical history of hypertension, hyperlipidemia, former smoker, hypothyroidism and obesity.  She has no history of MI or heart failure.  She does not see a cardiologist.  Paula. Eiting tells me that she has been having issues with rapid heartbeats and associated dizziness for several months.  She notes that at first it would occur with fairly strong exertion like working out in her yard.  She works cleaning houses for living and sometimes after a long day, on her way home she would have heart pounding and dizziness.  Her spells can last for 30 minutes or more.  She always has to stop and rest to see if she can get it to pass.  She feels like if she were to continue activity she would pass out.  She initially thought that it was orthostatic hypotension.  She has had no chest discomfort or pressure or shortness of breath.  In the last week she says that episodes have been up occurring about every other day.  She finally went to her primary care for evaluation and was found to have SVT with a heart rate of 185 on EKG. she says that she was lightheaded at the time.  She was sent to the emergency department and had a long wait before she was taken back.  The SVT apparently broke on its own.  And the emergency department she was asymptomatic and in normal sinus rhythm.  Troponins were mildly elevated in flat pattern 135, 121, presumably related to demand ischemia in the  setting of tachycardia.  The patient was admitted for observation and a 2D echocardiogram has been ordered.  Paula Cline denies any prior cardiac history.  She has never had testing or seen a cardiologist.  She does have high cholesterol and has been trying to manage it with diet.  She does take magnesium and potassium occasionally at night due to leg cramps, not every day.  The only family cardiac history she is aware of is her paternal grandfather who died of an MI.  She smoked lightly in her 9s and 30s, mostly socially, none since then.  She only drinks occasional beer or mixed drink, mostly on special occasions.  Heart Pathway Score:     Past Medical History:  Diagnosis Date   Allergy    RHINITIS   Arthritis    Diverticulitis    Former smoker    Hypercholesteremia    Hypertension    Obesity    Thyroid disease     Past Surgical History:  Procedure Laterality Date   COLONOSCOPY  2007/2008   Dr. Ardis Hughs   KNEE ARTHROSCOPY  2012   Right knee   WISDOM TOOTH EXTRACTION       Home Medications:  Prior to Admission medications   Medication Sig Start Date End Date Taking? Authorizing Provider  ALPRAZolam (XANAX) 0.25 MG tablet TAKE ONE TABLET BY MOUTH DAILY AS NEEDED FOR ANXIETY Patient taking differently: Take 0.25 mg by mouth daily  as needed for anxiety.  10/21/19  Yes Denita Lung, MD  calcium-vitamin D (OSCAL WITH D) 500-200 MG-UNIT tablet Take 1 tablet by mouth.   Yes [provider]  celecoxib (CELEBREX) 200 MG capsule Take 1 capsule (200 mg total) by mouth daily. Reported on 01/18/2016 06/03/19  Yes Denita Lung, MD  diclofenac sodium (VOLTAREN) 1 % GEL Apply 1 application topically 2 (two) times daily as needed.   Yes [provider]  Eszopiclone 3 MG TABS TAKE ONE TABLET BY MOUTH DAILY IMMEDIATELY BEFORE BEDTIME Patient taking differently: Take 3 mg by mouth at bedtime as needed (sleep).  10/21/19  Yes Denita Lung, MD    lisinopril-hydrochlorothiazide (ZESTORETIC) 20-12.5 MG tablet Take 1 tablet by mouth daily. 06/03/19  Yes Denita Lung, MD  magnesium 30 MG tablet Take 30 mg by mouth 2 (two) times daily.   Yes [provider]  Melatonin 3 MG CAPS Take by mouth.   Yes [provider]  Multiple Vitamins-Minerals (WOMENS 50+ MULTI VITAMIN/MIN) TABS Take by mouth.   Yes [provider]  Nutritional Supplements (VITAMIN D BOOSTER PO) Take 2,000 Units by mouth.   Yes [provider]  POTASSIUM PO Take by mouth.   Yes [provider]  SYNTHROID 125 MCG tablet Take 1 tablet (125 mcg total) by mouth daily. 06/03/19  Yes Denita Lung, MD  valACYclovir (VALTREX) 1000 MG tablet TAKE 2 TABLETS BY MOUTH TWICE DAILY FOR ONE DAY; THEN AS DIRECTED BY YOUR DOCTOR Patient taking differently: Take 1,000 mg by mouth daily as needed (fever blister outbreak).  02/16/19  Yes Denita Lung, MD  Glucosamine-Chondroitin (GLUCOSAMINE CHONDR COMPLEX PO) Take 1,000 mg by mouth.    [provider]    Inpatient Medications: Scheduled Meds:  calcium-vitamin D  1 tablet Oral Q breakfast   celecoxib  200 mg Oral Daily   enoxaparin (LOVENOX) injection  40 mg Subcutaneous Q24H   lisinopril  20 mg Oral Daily   And   hydrochlorothiazide  12.5 mg Oral Daily   levothyroxine  125 mcg Oral Daily   Continuous Infusions:  PRN Meds: acetaminophen **OR** acetaminophen, ALPRAZolam, Melatonin, ondansetron **OR** ondansetron (ZOFRAN) IV  Allergies:    Allergies  Allergen Reactions   Hydrocodone Itching    Social History:   Social History   Socioeconomic History   Marital status: Single    Spouse name: Not on file   Number of children: Not on file   Years of education: Not on file   Highest education level: Not on file  Occupational History   Not on file  Social Needs   Financial resource strain: Not on file   Food insecurity    Worry: Not on file    Inability:  Not on file   Transportation needs    Medical: Not on file    Non-medical: Not on file  Tobacco Use   Smoking status: Former Smoker    Types: Cigarettes    Quit date: 05/22/1993    Years since quitting: 26.4   Smokeless tobacco: Never Used  Substance and Sexual Activity   Alcohol use: Yes    Comment: rare   Drug use: No   Sexual activity: Not Currently  Lifestyle   Physical activity    Days per week: Not on file    Minutes per session: Not on file   Stress: Not on file  Relationships   Social connections    Talks on phone: Not on file  Gets together: Not on file    Attends religious service: Not on file    Active member of club or organization: Not on file    Attends meetings of clubs or organizations: Not on file    Relationship status: Not on file   Intimate partner violence    Fear of current or ex partner: Not on file    Emotionally abused: Not on file    Physically abused: Not on file    Forced sexual activity: Not on file  Other Topics Concern   Not on file  Social History Narrative   Not on file    Family History:    Family History  Problem Relation Age of Onset   Cancer Father 48       Lung cancer   Alzheimer's disease Father    Hypertension Father    Cancer Mother 70       Lung cancer   CAD Paternal Grandfather        died of heart attack   Colon cancer Neg Hx      ROS:  Please see the history of present illness.   All other ROS reviewed and negative.     Physical Exam/Data:   Vitals:   10/27/19 0124 10/27/19 0251 10/27/19 0252 10/27/19 0332  BP: 137/86 117/67  117/65  Pulse: 75 65 71 64  Resp: 14 11 18 18   Temp:    98 F (36.7 C)  TempSrc:    Oral  SpO2: 98% 96% 96% 99%  Weight:    95.4 kg  Height:    5\' 2"  (1.575 m)   No intake or output data in the 24 hours ending 10/27/19 1130 Last 3 Weights 10/27/2019 10/26/2019 06/03/2019  Weight (lbs) 210 lb 4.8 oz 212 lb 3.2 oz 214 lb 12.8 oz  Weight (kg) 95.391 kg 96.253 kg  97.433 kg     Body mass index is 38.46 kg/m.   Physical exam deferred at this time due to unknown Covid status.  EKG:  The EKG was personally reviewed and demonstrates: Normal sinus rhythm, 77 bpm Telemetry:  Telemetry was personally reviewed and demonstrates: Sinus rhythm 50s-60s overnight, 60s-70s today with occasionally up to sinus tach 100s-110s.  Relevant CV Studies:  Echocardiogram pending  Laboratory Data:  High Sensitivity Troponin:   Recent Labs  Lab 10/26/19 2242 10/27/19 0032  TROPONINIHS 135* 121*     Chemistry Recent Labs  Lab 10/26/19 1100 10/26/19 1342  NA 135 134*  K 4.7 3.7  CL 96 97*  CO2 20 22  GLUCOSE 163* 141*  BUN 32* 33*  CREATININE 1.09* 1.01*  CALCIUM 10.4* 10.0  GFRNONAA 54* 59*  GFRAA 62 >60  ANIONGAP  --  15    Recent Labs  Lab 10/26/19 1100  PROT 7.3  ALBUMIN 4.6  AST 22  ALT 15  ALKPHOS 92  BILITOT 0.4   Hematology Recent Labs  Lab 10/26/19 1100 10/26/19 1342  WBC 10.8 11.1*  RBC 5.27 5.06  HGB 15.5 15.0  HCT 46.7* 44.7  MCV 89 88.3  MCH 29.4 29.6  MCHC 33.2 33.6  RDW 12.2 12.8  PLT 312 291   BNPNo results for input(s): BNP, PROBNP in the last 168 hours.  DDimer  Recent Labs  Lab 10/26/19 2242  DDIMER 0.38     Radiology/Studies:  Dg Chest Portable 1 View  Result Date: 10/26/2019 CLINICAL DATA:  Tachycardia, dizziness EXAM: PORTABLE CHEST 1 VIEW COMPARISON:  None. FINDINGS: No consolidation, features of  edema, pneumothorax, or effusion. Pulmonary vascularity is normally distributed. The aorta is calcified. The remaining cardiomediastinal contours are unremarkable. No acute osseous or soft tissue abnormality. IMPRESSION: No acute cardiopulmonary abnormality. Aortic Atherosclerosis (ICD10-I70.0). Electronically Signed   By: Lovena Le M.D.   On: 10/26/2019 22:23    Assessment and Plan:   SVT -Patient was several months of episodes of fast heartbeat with associated dizziness lasting for 30 minutes or more,  this occurs usually after significant exertion and eventually resolves after rest.  No associated chest discomfort or shortness of breath.  Decently episodes are occurring about every other day. -Potassium was 4.7, magnesium 2.2. -High-sensitivity troponin mildly elevated and flat pattern, consistent with demand ischemia in setting of tachycardia. -Plan for echocardiogram today.  Evaluate LV function, valve status and wall motion. -Baseline heart rate is in the 50s-60s with occasional sinus tach up to 100s to 110s on telemetry.  No episodes of SVT on telemetry thus far.  Unsure if she would tolerate a beta-blocker due to baseline low heart rate, could try a low-dose. -Patient will need EP evaluation for possible SVT ablation considering her frequent symptomatic episodes.  -Further recommendations per Dr. Oval Linsey, attending cardiologist.  Hypothyroidism -Treated with Synthroid per PCP. -TSH 1.873  Hypertension -Patient continues on home lisinopril/hydrochlorothiazide. -Blood pressure is well controlled.  Hyperlipidemia -Cholesterol levels are elevated with LDL 173 in 05/2019, has been consistently high over the years.  According to the American heart association risk calculator the patient has a 10-year ASCVD risk of 5.2%.  Recommendations include consideration of moderate intensity statin and/or lifestyle modifications. -At this time patient prefers to attempt improvement with lifestyle changes including diet.  She can follow-up as an outpatient.      For questions or updates, please contact [place] Please consult [email or web address] for contact info under     Signed, Daune Perch, NP  10/27/2019 11:30 AM

## 2019-10-27 NOTE — Progress Notes (Signed)
Pt discharged to home instructions reviewed with patient, acknowledged understanding. Prescription reviewed. SRP, RN

## 2019-11-01 ENCOUNTER — Encounter: Payer: Self-pay | Admitting: Family Medicine

## 2019-11-01 ENCOUNTER — Ambulatory Visit: Payer: BC Managed Care – PPO | Admitting: Family Medicine

## 2019-11-01 VITALS — BP 120/68 | HR 57 | Temp 96.3°F | Wt 214.8 lb

## 2019-11-01 DIAGNOSIS — I471 Supraventricular tachycardia: Secondary | ICD-10-CM | POA: Diagnosis not present

## 2019-11-01 LAB — COMPREHENSIVE METABOLIC PANEL
ALT: 10 IU/L (ref 0–32)
AST: 14 IU/L (ref 0–40)
Albumin/Globulin Ratio: 2 (ref 1.2–2.2)
Albumin: 4.3 g/dL (ref 3.8–4.8)
Alkaline Phosphatase: 74 IU/L (ref 39–117)
BUN/Creatinine Ratio: 43 — ABNORMAL HIGH (ref 12–28)
BUN: 46 mg/dL — ABNORMAL HIGH (ref 8–27)
Bilirubin Total: 0.3 mg/dL (ref 0.0–1.2)
CO2: 24 mmol/L (ref 20–29)
Calcium: 9.7 mg/dL (ref 8.7–10.3)
Chloride: 98 mmol/L (ref 96–106)
Creatinine, Ser: 1.06 mg/dL — ABNORMAL HIGH (ref 0.57–1.00)
GFR calc Af Amer: 65 mL/min/{1.73_m2} (ref >59)
GFR calc non Af Amer: 56 mL/min/{1.73_m2} — ABNORMAL LOW (ref >59)
Globulin, Total: 2.2 g/dL (ref 1.5–4.5)
Glucose: 101 mg/dL — ABNORMAL HIGH (ref 65–99)
Potassium: 4.6 mmol/L (ref 3.5–5.2)
Sodium: 135 mmol/L (ref 134–144)
Total Protein: 6.5 g/dL (ref 6.0–8.5)

## 2019-11-01 NOTE — Progress Notes (Addendum)
   Subjective:    Patient ID: Paula Cline, female    DOB: October 15, 1956, 62 y.o.   MRN: [id number]  HPI She is here for follow-up after recent hospitalization for evaluation and treatment of SVT.  She was placed on metoprolol 25 mg twice per day.  Presently she is having no chest pain, rapid heart rate, dizziness, shortness of breath.   Review of Systems     Objective:   Physical Exam Alert and in no distress.  Cardiac exam shows a regular rhythm.  Lungs are clear to auscultation. Hemoglobin A1c on December 8 with 5.3      Assessment & Plan:  SVT (supraventricular tachycardia) ([place]) - Plan: Comprehensive metabolic panel Going to let her resume her normal activities but she needs to pay particular attention to any symptoms similar to the ones she had before.  She does have an appointment with cardiology on December 21.  She is looking forward to having her ablation.  She will call if further difficulty.

## 2019-11-02 ENCOUNTER — Other Ambulatory Visit: Payer: Self-pay | Admitting: Family Medicine

## 2019-11-02 DIAGNOSIS — E039 Hypothyroidism, unspecified: Secondary | ICD-10-CM

## 2019-11-04 DIAGNOSIS — F4322 Adjustment disorder with anxiety: Secondary | ICD-10-CM | POA: Diagnosis not present

## 2019-11-08 ENCOUNTER — Encounter: Payer: Self-pay | Admitting: Cardiology

## 2019-11-08 ENCOUNTER — Other Ambulatory Visit: Payer: Self-pay

## 2019-11-08 ENCOUNTER — Ambulatory Visit: Payer: BC Managed Care – PPO | Admitting: Cardiology

## 2019-11-08 VITALS — BP 128/72 | HR 60 | Ht 62.0 in | Wt 220.2 lb

## 2019-11-08 DIAGNOSIS — I471 Supraventricular tachycardia: Secondary | ICD-10-CM

## 2019-11-08 MED ORDER — METOPROLOL TARTRATE 25 MG PO TABS: 25.0000 mg | ORAL_TABLET | Freq: Two times a day (BID) | ORAL | 3 refills | Status: DC

## 2019-11-08 NOTE — Patient Instructions (Signed)
Medication Instructions:  Your physician recommends that you continue on your current medications as directed. Please refer to the Current Medication list given to you today.  * If you need a refill on your cardiac medications before your next appointment, please call your pharmacy.   Labwork: None ordered  Testing/Procedures: None ordered  Follow-Up: Your physician recommends that you schedule a follow-up appointment in: 3 months with Dr. Curt Bears.   Thank you for choosing [facility]!!   Trinidad Curet, RN [phone]  Any Other Special Instructions Will Be Listed Below (If Applicable).      Supraventricular Tachycardia, Adult Supraventricular tachycardia (SVT) is a type of abnormal heart rhythm. It causes the heart to beat very quickly and then return to a normal speed. A normal resting heart rate is 60-100 beats per minute. During an episode of SVT, your heart rate may be higher than 150 beats per minute. Episodes of SVT can be frightening, but they are usually not dangerous. However, if episodes happen several times per day or last longer than a few seconds, they may lead to heart failure. What are the causes?  Usually, a normal heartbeat starts when an area called the sinoatrial node releases an electrical signal. In SVT, other areas of the heart send out electrical signals that interfere with the signal from the sinoatrial node. It is not known why some people get SVT and others do not. What increases the risk? You are more likely to develop this condition if you are:  37-82 years old.  A woman. The following factors may make you more likely to develop this condition:  Stress.  Tiredness.  Smoking.  Stimulant drugs, such as cocaine and methamphetamine.  Alcohol.  Caffeine.  Pregnancy.  Anxiety. What are the signs or symptoms? Symptoms of this condition include:  A pounding heart.  A feeling that the heart is skipping beats  (palpitations).  Weakness.  Shortness of breath.  Tightness or pain in your chest.  Light-headedness.  Anxiety.  Dizziness.  Sweating.  Nausea.  Fainting.  Fatigue or tiredness. A mild episode may not cause symptoms. How is this diagnosed? This condition may be diagnosed based on:  Your symptoms.  A physical exam. ? If you have an episode of SVT during the exam, the health care provider may be able to diagnose SVT by listening to your heart and feeling your pulse.  Tests. These may include: ? An electrocardiogram (ECG). This test is done to check for problems with electrical activity in the heart. ? A Holter monitor or event monitor test. This test involves wearing a portable device that monitors your heart rate over time. ? An echocardiogram. This test involves taking an image of your heart using sound waves. It is done to rule out other causes of a fast heart rate. ? Blood tests. How is this treated? This condition may be treated with:  Vagal nerve stimulation. The treatment involves stimulating your vagus nerve, which slows down the heart. It is often the first and only treatment that is needed for this condition. Work with your health care provider to find which one works best for you. Ways to do this treatment include: ? Holding your breath and pushing, as though you are having a bowel movement. ? Massaging an area on one side of your neck, below your jaw. Do not try this yourself. Only a health care provider should do this. If done the wrong way, it can lead to a stroke. ? Bending forward with  your head between your legs. ? Coughing while bending forward with your head between your legs. ? Closing your eyes and massaging your eyeballs. A health care provider should guide you through this method before you try it on your own.  Medicines that prevent attacks.  Medicine to stop an attack. The medicine is given through an IV at the hospital.  A small electric shock  (cardioversion) that stops an attack. Before you get the shock, you will get medicine to make you fall asleep.  Radiofrequency ablation. In this procedure, a small, thin tube (catheter) is used to send radiofrequency energy to the area of tissue that is causing the rapid heartbeats. The energy kills the cells and helps your heart keep a normal rhythm. You may have this treatment if you have symptoms of SVT often. If you do not have symptoms, you may not need treatment. Follow these instructions at home: Stress  Avoid stressful situations when possible.  Find healthy ways of managing stress that work for you. Some healthy ways to manage stress include: ? Taking part in relaxing activities, such as yoga, meditation, or being out in nature. ? Listening to relaxing music. ? Practicing relaxation techniques, such as deep breathing. ? Leading a healthy lifestyle. This involves getting plenty of sleep, exercising, and eating a balanced diet. ? Attending counseling or talk therapy with a mental health professional. Lifestyle   Try to get at least 7 hours of sleep each night.  Do not use any products that contain nicotine or tobacco, such as cigarettes, e-cigarettes, and chewing tobacco. If you need help quitting, ask your health care provider.  Be aware of how alcohol affects your condition. If alcohol: ? Triggers episodes of SVT, do not drink alcohol. ? Does not seem to trigger episodes, limit alcohol intake to no more than 1 drink a day for nonpregnant women and 2 drinks a day for men. Be aware of how much alcohol is in your drink. In the [place] one drink equals one 12 oz bottle of beer (355 mL), one 5 oz glass of wine (148 mL), or one 1 oz glass of hard liquor (44 mL).  Be aware of how caffeine affects your condition. If caffeine: ? Triggers episodes of SVT, do not eat, drink, or use anything with caffeine in it. ? Does not seem to trigger episodes, consume caffeine in moderation.  Do not  use stimulant drugs. If you need help quitting, talk with your health care provider. General instructions  Maintain a healthy weight.  Exercise regularly. Ask your health care provider to suggest some good activities for you. Aim for one or a combination of the following: ? 150 minutes per week of moderate exercise, such as walking or yoga. ? 75 minutes per week of vigorous exercise, such as running or swimming.  Perform vagus nerve stimulation as directed by your health care provider.  Take over-the-counter and prescription medicines only as told by your health care provider.  Keep all follow-up visits as told by your health care provider. This is important. Contact a health care provider if:  You have episodes of SVT more often than before.  Episodes of SVT last longer than before.  Vagus nerve stimulation is no longer helping.  You have new symptoms. Get help right away if:  You have chest pain.  Your symptoms get worse.  You have trouble breathing.  You have an episode of SVT that lasts longer than 20 minutes.  You faint. These symptoms may represent  a serious problem that is an emergency. Do not wait to see if the symptoms will go away. Get medical help right away. Call your local emergency services (911 in the [place] Do not drive yourself to the hospital. Summary  Supraventricular tachycardia (SVT) is a type of abnormal heart rhythm.  During an episode of SVT, your heart rate may be higher than 150 beats per minute.  Treatment depends on frequency of occurrence and symptoms experienced. This information is not intended to replace advice given to you by your health care provider. Make sure you discuss any questions you have with your health care provider. Document Released: 11/04/2005 Document Revised: 09/22/2018 Document Reviewed: 09/22/2018 Elsevier Patient Education  2020 [employer].    Cardiac Ablation Cardiac ablation is a procedure to disable (ablate) a  small amount of heart tissue in very specific places. The heart has many electrical connections. Sometimes these connections are abnormal and can cause the heart to beat very fast or irregularly. Ablating some of the problem areas can improve the heart rhythm or return it to normal. Ablation may be done for people who:  Have Wolff-Parkinson-White syndrome.  Have fast heart rhythms (tachycardia).  Have taken medicines for an abnormal heart rhythm (arrhythmia) that were not effective or caused side effects.  Have a high-risk heartbeat that may be life-threatening. During the procedure, a small incision is made in the neck or the groin, and a long, thin, flexible tube (catheter) is inserted into the incision and moved to the heart. Small devices (electrodes) on the tip of the catheter will send out electrical currents. A type of X-ray (fluoroscopy) will be used to help guide the catheter and to provide images of the heart. Tell a health care provider about:  Any allergies you have.  All medicines you are taking, including vitamins, herbs, eye drops, creams, and over-the-counter medicines.  Any problems you or family members have had with anesthetic medicines.  Any blood disorders you have.  Any surgeries you have had.  Any medical conditions you have, such as kidney failure.  Whether you are pregnant or may be pregnant. What are the risks? Generally, this is a safe procedure. However, problems may occur, including:  Infection.  Bruising and bleeding at the catheter insertion site.  Bleeding into the chest, especially into the sac that surrounds the heart. This is a serious complication.  Stroke or blood clots.  Damage to other structures or organs.  Allergic reaction to medicines or dyes.  Need for a permanent pacemaker if the normal electrical system is damaged. A pacemaker is a small computer that sends electrical signals to the heart and helps your heart beat normally.  The  procedure not being fully effective. This may not be recognized until months later. Repeat ablation procedures are sometimes required. What happens before the procedure?  Follow instructions from your health care provider about eating or drinking restrictions.  Ask your health care provider about: ? Changing or stopping your regular medicines. This is especially important if you are taking diabetes medicines or blood thinners. ? Taking medicines such as aspirin and ibuprofen. These medicines can thin your blood. Do not take these medicines before your procedure if your health care provider instructs you not to.  Plan to have someone take you home from the hospital or clinic.  If you will be going home right after the procedure, plan to have someone with you for 24 hours. What happens during the procedure?  To lower your risk of  infection: ? Your health care team will wash or sanitize their hands. ? Your skin will be washed with soap. ? Hair may be removed from the incision area.  An IV tube will be inserted into one of your veins.  You will be given a medicine to help you relax (sedative).  The skin on your neck or groin will be numbed.  An incision will be made in your neck or your groin.  A needle will be inserted through the incision and into a large vein in your neck or groin.  A catheter will be inserted into the needle and moved to your heart.  Dye may be injected through the catheter to help your surgeon see the area of the heart that needs treatment.  Electrical currents will be sent from the catheter to ablate heart tissue in desired areas. There are three types of energy that may be used to ablate heart tissue: ? Heat (radiofrequency energy). ? Laser energy. ? Extreme cold (cryoablation).  When the necessary tissue has been ablated, the catheter will be removed.  Pressure will be held on the catheter insertion area to prevent excessive bleeding.  A bandage  (dressing) will be placed over the catheter insertion area. The procedure may vary among health care providers and hospitals. What happens after the procedure?  Your blood pressure, heart rate, breathing rate, and blood oxygen level will be monitored until the medicines you were given have worn off.  Your catheter insertion area will be monitored for bleeding. You will need to lie still for a few hours to ensure that you do not bleed from the catheter insertion area.  Do not drive for 24 hours or as long as directed by your health care provider. Summary  Cardiac ablation is a procedure to disable (ablate) a small amount of heart tissue in very specific places. Ablating some of the problem areas can improve the heart rhythm or return it to normal.  During the procedure, electrical currents will be sent from the catheter to ablate heart tissue in desired areas. This information is not intended to replace advice given to you by your health care provider. Make sure you discuss any questions you have with your health care provider. Document Released: 03/23/2009 Document Revised: 04/27/2018 Document Reviewed: 09/23/2016 Elsevier Patient Education  2020 [employer].

## 2019-11-08 NOTE — Progress Notes (Signed)
Electrophysiology Office Note   Date:  11/08/2019   ID:  Paula Cline, Paula Cline February 26, 1956, MRN [id number]  PCP:  Denita Lung, MD  Cardiologist:  Oval Linsey Primary Electrophysiologist:  Antwyne Pingree Meredith Leeds, MD    Chief Complaint: SVT   History of Present Illness: Paula Cline is a 63 y.o. female who is being seen today for the evaluation of SVT at the request of Denita Lung, MD. Presenting today for electrophysiology evaluation.  He has a history of hypertension, hyperlipidemia, former tobacco abuse, hypothyroidism, and obesity.  She had been having palpitations and dizziness for several months.  She presented to the emergency room 10/26/2019.  She had been having several months of intermittent episodes of palpitations and presyncope.  The longest occurred for 30 minutes.  Prior to her hospital admission, this was happening twice a week.  Her PCP found her to be in SVT with heart rates of 185 bpm.  She was started on metoprolol while in the hospital.  Today, she denies symptoms of palpitations, chest pain, shortness of breath, orthopnea, PND, lower extremity edema, claudication, dizziness, presyncope, syncope, bleeding, or neurologic sequela. The patient is tolerating medications without difficulties.  Since starting the metoprolol, she has had no further SVT symptoms.  That being said, she has been much less active.   Past Medical History:  Diagnosis Date  . Allergy    RHINITIS  . Arthritis   . Diverticulitis   . Former smoker   . Hypercholesteremia   . Hypertension   . Obesity   . Thyroid disease    Past Surgical History:  Procedure Laterality Date  . COLONOSCOPY  2007/2008   Dr. Ardis Hughs  . KNEE ARTHROSCOPY  2012   Right knee  . WISDOM TOOTH EXTRACTION       Current Outpatient Medications  Medication Sig Dispense Refill  . ALPRAZolam (XANAX) 0.25 MG tablet TAKE ONE TABLET BY MOUTH DAILY AS NEEDED FOR ANXIETY 20 tablet 0  . calcium-vitamin D (OSCAL WITH D)  500-200 MG-UNIT tablet Take 1 tablet by mouth.    . celecoxib (CELEBREX) 200 MG capsule Take 1 capsule (200 mg total) by mouth daily. Reported on 01/18/2016 90 capsule 3  . diclofenac sodium (VOLTAREN) 1 % GEL Apply 1 application topically 2 (two) times daily as needed.    . Eszopiclone 3 MG TABS TAKE ONE TABLET BY MOUTH DAILY IMMEDIATELY BEFORE BEDTIME 30 tablet 0  . Glucosamine-Chondroitin (GLUCOSAMINE CHONDR COMPLEX PO) Take 1,000 mg by mouth.    Marland Kitchen lisinopril-hydrochlorothiazide (ZESTORETIC) 20-12.5 MG tablet Take 1 tablet by mouth daily. 90 tablet 3  . magnesium 30 MG tablet Take 30 mg by mouth 2 (two) times daily.    . Melatonin 3 MG CAPS Take by mouth.    . metoprolol tartrate (LOPRESSOR) 25 MG tablet Take 1 tablet (25 mg total) by mouth 2 (two) times daily. 60 tablet 3  . Multiple Vitamins-Minerals (WOMENS 50+ MULTI VITAMIN/MIN) TABS Take by mouth.    . Nutritional Supplements (VITAMIN D BOOSTER PO) Take 2,000 Units by mouth.    Marland Kitchen POTASSIUM PO Take by mouth.    . SYNTHROID 125 MCG tablet TAKE ONE TABLET BY MOUTH DAILY 90 tablet 2  . valACYclovir (VALTREX) 1000 MG tablet TAKE 2 TABLETS BY MOUTH TWICE DAILY FOR ONE DAY; THEN AS DIRECTED BY YOUR DOCTOR 12 tablet 5   No current facility-administered medications for this visit.    Allergies:   Hydrocodone   Social History:  The patient  reports that she quit smoking about 26 years ago. Her smoking use included cigarettes. She has never used smokeless tobacco. She reports current alcohol use. She reports that she does not use drugs.   Family History:  The patient's family history includes Alzheimer's disease in her father; CAD in her paternal grandfather; Cancer (age of onset: 39) in her mother; Cancer (age of onset: 56) in her father; Hypertension in her father.    ROS:  Please see the history of present illness.   Otherwise, review of systems is positive for none.   All other systems are reviewed and negative.    PHYSICAL EXAM: VS:  BP  128/72   Pulse 60   Ht 5\' 2"  (1.575 m)   Wt 220 lb 3.2 oz (99.9 kg)   LMP 11/18/2010   SpO2 99%   BMI 40.28 kg/m  , BMI Body mass index is 40.28 kg/m. GEN: Well nourished, well developed, in no acute distress  HEENT: normal  Neck: no JVD, carotid bruits, or masses Cardiac: RRR; no murmurs, rubs, or gallops,no edema  Respiratory:  clear to auscultation bilaterally, normal work of breathing GI: soft, nontender, nondistended, + BS MS: no deformity or atrophy  Skin: warm and dry Neuro:  Strength and sensation are intact Psych: euthymic mood, full affect  EKG:  EKG is not ordered today. Personal review of the ekg ordered 10/26/19 shows SVT, rate 185   Recent Labs: 10/26/2019: Hemoglobin 15.0; Platelets 291 10/27/2019: Magnesium 2.2; TSH 1.873 11/01/2019: ALT 10; BUN 46; Creatinine, Ser 1.06; Potassium 4.6; Sodium 135    Lipid Panel     Component Value Date/Time   CHOL 250 (H) 06/03/2019 1008   TRIG 44 06/03/2019 1008   HDL 68 06/03/2019 1008   CHOLHDL 3.7 06/03/2019 1008   CHOLHDL 3.6 01/16/2017 0905   VLDL 8 01/16/2017 0905   LDLCALC 173 (H) 06/03/2019 1008     Wt Readings from Last 3 Encounters:  11/08/19 220 lb 3.2 oz (99.9 kg)  11/01/19 214 lb 12.8 oz (97.4 kg)  10/27/19 210 lb 4.8 oz (95.4 kg)      Other studies Reviewed: Additional studies/ records that were reviewed today include: TTE 10/27/19  Review of the above records today demonstrates:   1. Left ventricular ejection fraction, by visual estimation, is 60 to 65%. The left ventricle has normal function. There is no left ventricular hypertrophy.  2. The left ventricle has no regional wall motion abnormalities.  3. Global right ventricle has normal systolic function.The right ventricular size is normal. No increase in right ventricular wall thickness.  4. Left atrial size was normal.  5. Right atrial size was normal.  6. Presence of pericardial fat pad.  7. Trivial pericardial effusion is present.  8. The  mitral valve is grossly normal. Trivial mitral valve regurgitation.  9. The tricuspid valve is grossly normal. Tricuspid valve regurgitation is trivial. 10. The aortic valve is tricuspid. Aortic valve regurgitation is not visualized. No evidence of aortic valve sclerosis or stenosis. 11. The pulmonic valve was grossly normal. Pulmonic valve regurgitation is trivial. 12. Normal pulmonary artery systolic pressure. 13. The tricuspid regurgitant velocity is 2.28 m/s, and with an assumed right atrial pressure of 3 mmHg, the estimated right ventricular systolic pressure is normal at 23.7 mmHg. 14. The inferior vena cava is normal in size with greater than 50% respiratory variability, suggesting right atrial pressure of 3 mmHg. 15. No prior Echocardiogram.   ASSESSMENT AND PLAN:  1.  SVT: Likely due  to AVNRT, though ORT cannot be fully excluded.  She is currently on metoprolol.  She would prefer to be off of medications and thus ablation would be optimal.  Risks and benefits were discussed include bleeding, tamponade, heart block, stroke, among others.  She understands these risks and is agreed to the procedure.  We Fynn Adel continue her metoprolol until we can get her scheduled for ablation.  I have told her that it is okay to go back to normal activities.  2.  Hypertension: Currently well controlled  3.  Hyperlipidemia: Currently using lifestyle modifications.  Plan per primary care physician.  Current medicines are reviewed at length with the patient today.   The patient does not have concerns regarding her medicines.  The following changes were made today:  none  Labs/ tests ordered today include:  No orders of the defined types were placed in this encounter.    Disposition:   FU with Tico Crotteau 3 months  Signed, Iyannah Blake Meredith Leeds, MD  11/08/2019 10:33 AM     [facility] [address] [place] [address] [phone] (office) [phone] (fax)

## 2019-11-16 LAB — SPECIMEN STATUS REPORT

## 2019-11-16 LAB — HGB A1C W/O EAG: Hgb A1c MFr Bld: 5.3 % (ref 4.8–5.6)

## 2019-11-18 ENCOUNTER — Other Ambulatory Visit: Payer: Self-pay | Admitting: *Deleted

## 2019-11-18 MED ORDER — METOPROLOL TARTRATE 25 MG PO TABS: 25.0000 mg | ORAL_TABLET | Freq: Two times a day (BID) | ORAL | 3 refills | Status: DC

## 2019-11-25 DIAGNOSIS — H2513 Age-related nuclear cataract, bilateral: Secondary | ICD-10-CM | POA: Diagnosis not present

## 2019-12-15 DIAGNOSIS — M170 Bilateral primary osteoarthritis of knee: Secondary | ICD-10-CM | POA: Diagnosis not present

## 2019-12-23 DIAGNOSIS — M170 Bilateral primary osteoarthritis of knee: Secondary | ICD-10-CM | POA: Diagnosis not present

## 2019-12-30 DIAGNOSIS — M170 Bilateral primary osteoarthritis of knee: Secondary | ICD-10-CM | POA: Diagnosis not present

## 2020-01-31 ENCOUNTER — Other Ambulatory Visit: Payer: Self-pay

## 2020-01-31 ENCOUNTER — Encounter: Payer: Self-pay | Admitting: Cardiology

## 2020-01-31 ENCOUNTER — Encounter: Payer: Self-pay | Admitting: Family Medicine

## 2020-01-31 ENCOUNTER — Ambulatory Visit: Payer: BC Managed Care – PPO | Admitting: Cardiology

## 2020-01-31 VITALS — BP 128/78 | HR 55 | Ht 62.0 in | Wt 217.0 lb

## 2020-01-31 DIAGNOSIS — I471 Supraventricular tachycardia: Secondary | ICD-10-CM | POA: Diagnosis not present

## 2020-01-31 DIAGNOSIS — Z01812 Encounter for preprocedural laboratory examination: Secondary | ICD-10-CM | POA: Diagnosis not present

## 2020-01-31 LAB — CBC
Hematocrit: 42 % (ref 34.0–46.6)
Hemoglobin: 13.6 g/dL (ref 11.1–15.9)
MCH: 28.5 pg (ref 26.6–33.0)
MCHC: 32.4 g/dL (ref 31.5–35.7)
MCV: 88 fL (ref 79–97)
Platelets: 291 10*3/uL (ref 150–450)
RBC: 4.78 x10E6/uL (ref 3.77–5.28)
RDW: 12.4 % (ref 11.7–15.4)
WBC: 10.4 10*3/uL (ref 3.4–10.8)

## 2020-01-31 LAB — BASIC METABOLIC PANEL
BUN/Creatinine Ratio: 24 (ref 12–28)
BUN: 22 mg/dL (ref 8–27)
CO2: 22 mmol/L (ref 20–29)
Calcium: 10 mg/dL (ref 8.7–10.3)
Chloride: 99 mmol/L (ref 96–106)
Creatinine, Ser: 0.93 mg/dL (ref 0.57–1.00)
GFR calc Af Amer: 76 mL/min/{1.73_m2} (ref >59)
GFR calc non Af Amer: 66 mL/min/{1.73_m2} (ref >59)
Glucose: 93 mg/dL (ref 65–99)
Potassium: 4.6 mmol/L (ref 3.5–5.2)
Sodium: 137 mmol/L (ref 134–144)

## 2020-01-31 NOTE — Progress Notes (Signed)
Electrophysiology Office Note   Date:  01/31/2020   ID:  Paula Cline, DOB 1956/11/10, MRN [id number]  PCP:  Denita Lung, MD  Cardiologist:  Oval Linsey Primary Electrophysiologist:  Rayn Shorb Meredith Leeds, MD    Chief Complaint: SVT   History of Present Illness: Paula Cline is a 64 y.o. female who is being seen today for the evaluation of SVT at the request of Denita Lung, MD. Presenting today for electrophysiology evaluation.  He has a history of hypertension, hyperlipidemia, former tobacco abuse, hypothyroidism, and obesity.  She had been having palpitations and dizziness for several months.  She presented to the emergency room 10/26/2019.  She had been having several months of intermittent episodes of palpitations and presyncope.  The longest occurred for 30 minutes.  Prior to her hospital admission, this was happening twice a week.  Her PCP found her to be in SVT with heart rates of 185 bpm.  She was started on metoprolol while in the hospital.  Today, denies symptoms of palpitations, chest pain, shortness of breath, orthopnea, PND, lower extremity edema, claudication, dizziness, presyncope, syncope, bleeding, or neurologic sequela. The patient is tolerating medications without difficulties.  Overall she is doing well.  She has continued to have short episodes of SVT.  She has been using vagal maneuvers which has terminated her episodes.  She is feeling cold which she attributes to her metoprolol.  Otherwise she has no major complaints.   Past Medical History:  Diagnosis Date  . Allergy    RHINITIS  . Arthritis   . Diverticulitis   . Former smoker   . Hypercholesteremia   . Hypertension   . Obesity   . Thyroid disease    Past Surgical History:  Procedure Laterality Date  . COLONOSCOPY  2007/2008   Dr. Ardis Hughs  . KNEE ARTHROSCOPY  2012   Right knee  . WISDOM TOOTH EXTRACTION       Current Outpatient Medications  Medication Sig Dispense Refill  . ALPRAZolam  (XANAX) 0.25 MG tablet TAKE ONE TABLET BY MOUTH DAILY AS NEEDED FOR ANXIETY 20 tablet 0  . calcium-vitamin D (OSCAL WITH D) 500-200 MG-UNIT tablet Take 1 tablet by mouth.    . celecoxib (CELEBREX) 200 MG capsule Take 1 capsule (200 mg total) by mouth daily. Reported on 01/18/2016 90 capsule 3  . diclofenac sodium (VOLTAREN) 1 % GEL Apply 1 application topically 2 (two) times daily as needed.    . Eszopiclone 3 MG TABS TAKE ONE TABLET BY MOUTH DAILY IMMEDIATELY BEFORE BEDTIME 30 tablet 0  . Glucosamine-Chondroitin (GLUCOSAMINE CHONDR COMPLEX PO) Take 1,000 mg by mouth.    Marland Kitchen lisinopril-hydrochlorothiazide (ZESTORETIC) 20-12.5 MG tablet Take 1 tablet by mouth daily. 90 tablet 3  . magnesium 30 MG tablet Take 30 mg by mouth 2 (two) times daily.    . Melatonin 3 MG CAPS Take by mouth.    . metoprolol tartrate (LOPRESSOR) 25 MG tablet Take 1 tablet (25 mg total) by mouth 2 (two) times daily. 60 tablet 3  . Multiple Vitamins-Minerals (WOMENS 50+ MULTI VITAMIN/MIN) TABS Take by mouth.    . Nutritional Supplements (VITAMIN D BOOSTER PO) Take 2,000 Units by mouth.    Marland Kitchen POTASSIUM PO Take by mouth.    . SYNTHROID 125 MCG tablet TAKE ONE TABLET BY MOUTH DAILY 90 tablet 2  . valACYclovir (VALTREX) 1000 MG tablet TAKE 2 TABLETS BY MOUTH TWICE DAILY FOR ONE DAY; THEN AS DIRECTED BY YOUR DOCTOR 12 tablet 5  No current facility-administered medications for this visit.    Allergies:   Hydrocodone   Social History:  The patient  reports that she quit smoking about 26 years ago. Her smoking use included cigarettes. She has never used smokeless tobacco. She reports current alcohol use. She reports that she does not use drugs.   Family History:  The patient's family history includes Alzheimer's disease in her father; CAD in her paternal grandfather; Cancer (age of onset: 72) in her mother; Cancer (age of onset: 5) in her father; Hypertension in her father.    ROS:  Please see the history of present illness.    Otherwise, review of systems is positive for none.   All other systems are reviewed and negative.   PHYSICAL EXAM: VS:  BP 128/78   Pulse (!) 55   Ht 5\' 2"  (1.575 m)   Wt 217 lb (98.4 kg)   LMP 11/18/2010   BMI 39.69 kg/m  , BMI Body mass index is 39.69 kg/m. GEN: Well nourished, well developed, in no acute distress  HEENT: normal  Neck: no JVD, carotid bruits, or masses Cardiac: RRR; no murmurs, rubs, or gallops,no edema  Respiratory:  clear to auscultation bilaterally, normal work of breathing GI: soft, nontender, nondistended, + BS MS: no deformity or atrophy  Skin: warm and dry Neuro:  Strength and sensation are intact Psych: euthymic mood, full affect  EKG:  EKG is ordered today. Personal review of the ekg ordered shows sinus rhythm, rate 55   Recent Labs: 10/26/2019: Hemoglobin 15.0; Platelets 291 10/27/2019: Magnesium 2.2; TSH 1.873 11/01/2019: ALT 10; BUN 46; Creatinine, Ser 1.06; Potassium 4.6; Sodium 135    Lipid Panel     Component Value Date/Time   CHOL 250 (H) 06/03/2019 1008   TRIG 44 06/03/2019 1008   HDL 68 06/03/2019 1008   CHOLHDL 3.7 06/03/2019 1008   CHOLHDL 3.6 01/16/2017 0905   VLDL 8 01/16/2017 0905   LDLCALC 173 (H) 06/03/2019 1008     Wt Readings from Last 3 Encounters:  01/31/20 217 lb (98.4 kg)  11/08/19 220 lb 3.2 oz (99.9 kg)  11/01/19 214 lb 12.8 oz (97.4 kg)      Other studies Reviewed: Additional studies/ records that were reviewed today include: TTE 10/27/19  Review of the above records today demonstrates:   1. Left ventricular ejection fraction, by visual estimation, is 60 to 65%. The left ventricle has normal function. There is no left ventricular hypertrophy.  2. The left ventricle has no regional wall motion abnormalities.  3. Global right ventricle has normal systolic function.The right ventricular size is normal. No increase in right ventricular wall thickness.  4. Left atrial size was normal.  5. Right atrial size was  normal.  6. Presence of pericardial fat pad.  7. Trivial pericardial effusion is present.  8. The mitral valve is grossly normal. Trivial mitral valve regurgitation.  9. The tricuspid valve is grossly normal. Tricuspid valve regurgitation is trivial. 10. The aortic valve is tricuspid. Aortic valve regurgitation is not visualized. No evidence of aortic valve sclerosis or stenosis. 11. The pulmonic valve was grossly normal. Pulmonic valve regurgitation is trivial. 12. Normal pulmonary artery systolic pressure. 13. The tricuspid regurgitant velocity is 2.28 m/s, and with an assumed right atrial pressure of 3 mmHg, the estimated right ventricular systolic pressure is normal at 23.7 mmHg. 14. The inferior vena cava is normal in size with greater than 50% respiratory variability, suggesting right atrial pressure of 3 mmHg. 15. No  prior Echocardiogram.   ASSESSMENT AND PLAN:  1.  SVT: Likely due to AVNRT, though ORT cannot be fully excluded.  She would prefer ablation.  Currently on metoprolol.  Risks and benefits of ablation were discussed include bleeding, tamponade, heart block, stroke, damage surrounding organs, among others.  He understands the risks and is agreed to the procedure.  2.  Hypertension: Currently well controlled  3.  Hyperlipidemia: Currently using lifestyle management option.  Plan per primary physician.  Current medicines are reviewed at length with the patient today.   The patient does not have concerns regarding her medicines.  The following changes were made today: None  Labs/ tests ordered today include:  Orders Placed This Encounter  Procedures  . Basic metabolic panel  . CBC  . EKG 12-Lead     Disposition:   FU with Fenix Rorke 3 months  Signed, Peytyn Trine Meredith Leeds, MD  01/31/2020 11:20 AM     [place] [address] [phone] (office) [phone] (fax)

## 2020-01-31 NOTE — Patient Instructions (Addendum)
Medication Instructions:  Your physician recommends that you continue on your current medications as directed. Please refer to the Current Medication list given to you today.  *If you need a refill on your cardiac medications before your next appointment, please call your pharmacy*   Lab Work: Pre procedure labs today: BMET & CBC If you have labs (blood work) drawn today and your tests are completely normal, you will receive your results only by: Marland Kitchen MyChart Message (if you have MyChart) OR . A paper copy in the mail If you have any lab test that is abnormal or we need to change your treatment, we will call you to review the results.   Testing/Procedures: Your physician has recommended that you have an ablation. Catheter ablation is a medical procedure used to treat some cardiac arrhythmias (irregular heartbeats). During catheter ablation, a long, thin, flexible tube is put into a blood vessel in your groin (upper thigh), or neck. This tube is called an ablation catheter. It is then guided to your heart through the blood vessel. Radio frequency waves destroy small areas of heart tissue where abnormal heartbeats may cause an arrhythmia to start. Please see the instructions below.  Follow-Up: At [facility], you and your health needs are our priority.  As part of our continuing mission to provide you with exceptional heart care, we have created designated Provider Care Teams.  These Care Teams include your primary Cardiologist (physician) and Advanced Practice Providers (APPs -  Physician Assistants and Nurse Practitioners) who all work together to provide you with the care you need, when you need it.  Your next appointment:   2 month(s)  The format for your next appointment:   In Person  Provider:   Allegra Lai, MD   Thank you for choosing [facility]!!   Trinidad Curet, RN [phone]    Other Instructions   Electrophysiology/Ablation Procedure Instructions   You are  scheduled for a(n)  ablation on 03/01/2020 with Dr. Allegra Lai.   1.   Pre procedure testing-             A.  LAB WORK --- 01/31/20 for your pre procedure blood work.                 B. COVID TEST-- On 02/28/20 @ 9:00 am- You will go to [facility] hospital ([place]) for your Covid testing.   This is a drive thru test site.  There will be multiple testing areas.  Be sure to share with the first checkpoint that you are there for pre-procedure/surgery testing. This will put you into the right (yellow) lane that leads to the PAT testing team. Stay in your car and the nurse team will come to your car to test you.  After you are tested please go home and self quarantine until the day of your procedure.     2. On the day of your procedure 03/01/2020 you will go to [facility] [phone]) at 11:30 am.  Dennis Bast will go to the main entrance A [employer]) and enter where the [employer] are.  Your driver will drop you off and you will head down the hallway to ADMITTING.  You may have one support person come in to the hospital with you.  They will be asked to wait in the waiting room.   3.   Do not eat or drink after midnight prior to your procedure.   4.  Do NOT take any medications the morning of your procedure.   5.  Plan for an overnight stay.  If you use your phone frequently bring your phone charger.   6. You will follow up with Dr. Curt Bears 1 month after your procedure.  This appointment will be made for you.   * If you have ANY questions please call the office [phone] and ask for Joandy Burget RN or send me a MyChart message   * Occasionally, EP Studies and ablations can become lengthy.  Please make your family aware of this before your procedure starts.  Average time ranges from 2-8 hours for EP studies/ablations.  Your physician will call your family after the procedure with the results.                                    Cardiac Ablation Cardiac  ablation is a procedure to disable (ablate) a small amount of heart tissue in very specific places. The heart has many electrical connections. Sometimes these connections are abnormal and can cause the heart to beat very fast or irregularly. Ablating some of the problem areas can improve the heart rhythm or return it to normal. Ablation may be done for people who:  Have Wolff-Parkinson-White syndrome.  Have fast heart rhythms (tachycardia).  Have taken medicines for an abnormal heart rhythm (arrhythmia) that were not effective or caused side effects.  Have a high-risk heartbeat that may be life-threatening. During the procedure, a small incision is made in the neck or the groin, and a long, thin, flexible tube (catheter) is inserted into the incision and moved to the heart. Small devices (electrodes) on the tip of the catheter will send out electrical currents. A type of X-ray (fluoroscopy) will be used to help guide the catheter and to provide images of the heart. Tell a health care provider about:  Any allergies you have.  All medicines you are taking, including vitamins, herbs, eye drops, creams, and over-the-counter medicines.  Any problems you or family members have had with anesthetic medicines.  Any blood disorders you have.  Any surgeries you have had.  Any medical conditions you have, such as kidney failure.  Whether you are pregnant or may be pregnant. What are the risks? Generally, this is a safe procedure. However, problems may occur, including:  Infection.  Bruising and bleeding at the catheter insertion site.  Bleeding into the chest, especially into the sac that surrounds the heart. This is a serious complication.  Stroke or blood clots.  Damage to other structures or organs.  Allergic reaction to medicines or dyes.  Need for a permanent pacemaker if the normal electrical system is damaged. A pacemaker is a small computer that sends electrical signals to the  heart and helps your heart beat normally.  The procedure not being fully effective. This may not be recognized until months later. Repeat ablation procedures are sometimes required. What happens before the procedure?  Follow instructions from your health care provider about eating or drinking restrictions.  Ask your health care provider about: ? Changing or stopping your regular medicines. This is especially important if you are taking diabetes medicines or blood thinners. ? Taking medicines such as aspirin and ibuprofen. These medicines can thin your blood. Do not take these medicines before your procedure if your health care provider instructs you not to.  Plan to have someone take you home from the hospital  or clinic.  If you will be going home right after the procedure, plan to have someone with you for 24 hours. What happens during the procedure?  To lower your risk of infection: ? Your health care team will wash or sanitize their hands. ? Your skin will be washed with soap. ? Hair may be removed from the incision area.  An IV tube will be inserted into one of your veins.  You will be given a medicine to help you relax (sedative).  The skin on your neck or groin will be numbed.  An incision will be made in your neck or your groin.  A needle will be inserted through the incision and into a large vein in your neck or groin.  A catheter will be inserted into the needle and moved to your heart.  Dye may be injected through the catheter to help your surgeon see the area of the heart that needs treatment.  Electrical currents will be sent from the catheter to ablate heart tissue in desired areas. There are three types of energy that may be used to ablate heart tissue: ? Heat (radiofrequency energy). ? Laser energy. ? Extreme cold (cryoablation).  When the necessary tissue has been ablated, the catheter will be removed.  Pressure will be held on the catheter insertion area to  prevent excessive bleeding.  A bandage (dressing) will be placed over the catheter insertion area. The procedure may vary among health care providers and hospitals. What happens after the procedure?  Your blood pressure, heart rate, breathing rate, and blood oxygen level will be monitored until the medicines you were given have worn off.  Your catheter insertion area will be monitored for bleeding. You will need to lie still for a few hours to ensure that you do not bleed from the catheter insertion area.  Do not drive for 24 hours or as long as directed by your health care provider. Summary  Cardiac ablation is a procedure to disable (ablate) a small amount of heart tissue in very specific places. Ablating some of the problem areas can improve the heart rhythm or return it to normal.  During the procedure, electrical currents will be sent from the catheter to ablate heart tissue in desired areas. This information is not intended to replace advice given to you by your health care provider. Make sure you discuss any questions you have with your health care provider. Document Revised: 04/27/2018 Document Reviewed: 09/23/2016 Elsevier Patient Education  [place].

## 2020-02-01 ENCOUNTER — Other Ambulatory Visit: Payer: Self-pay | Admitting: Family Medicine

## 2020-02-02 NOTE — Telephone Encounter (Signed)
[facility] is requesting to fill pt eszopiclone. Please advise [facility]

## 2020-02-13 ENCOUNTER — Other Ambulatory Visit: Payer: Self-pay | Admitting: Family Medicine

## 2020-02-14 ENCOUNTER — Other Ambulatory Visit: Payer: Self-pay | Admitting: Cardiology

## 2020-02-14 NOTE — Telephone Encounter (Signed)
[facility] is requesting to fill pt xanax. Please advise [facility] 

## 2020-02-17 DIAGNOSIS — I471 Supraventricular tachycardia: Secondary | ICD-10-CM

## 2020-02-17 HISTORY — DX: Supraventricular tachycardia: I47.1

## 2020-02-28 ENCOUNTER — Other Ambulatory Visit (HOSPITAL_COMMUNITY)
Admission: RE | Admit: 2020-02-28 | Discharge: 2020-02-28 | Disposition: A | Discharge status: Home / Self Care | Payer: BC Managed Care – PPO | Source: Ambulatory Visit | Attending: Cardiology | Admitting: Cardiology

## 2020-02-28 DIAGNOSIS — Z20822 Contact with and (suspected) exposure to covid-19: Secondary | ICD-10-CM | POA: Diagnosis not present

## 2020-02-28 DIAGNOSIS — Z01812 Encounter for preprocedural laboratory examination: Secondary | ICD-10-CM | POA: Insufficient documentation

## 2020-02-28 LAB — SARS CORONAVIRUS 2 (TAT 6-24 HRS): SARS Coronavirus 2: NEGATIVE

## 2020-03-01 ENCOUNTER — Ambulatory Visit (HOSPITAL_COMMUNITY)
Admission: RE | Admit: 2020-03-01 | Discharge: 2020-03-02 | Disposition: A | Discharge status: Home / Self Care | Payer: BC Managed Care – PPO | Attending: Cardiology | Admitting: Cardiology

## 2020-03-01 ENCOUNTER — Other Ambulatory Visit: Payer: Self-pay

## 2020-03-01 ENCOUNTER — Encounter (HOSPITAL_COMMUNITY)
Admission: RE | Disposition: A | Discharge status: Home / Self Care | Payer: Self-pay | Source: Home / Self Care | Attending: Cardiology

## 2020-03-01 DIAGNOSIS — I4719 Other supraventricular tachycardia: Secondary | ICD-10-CM | POA: Diagnosis present

## 2020-03-01 DIAGNOSIS — Z79890 Hormone replacement therapy (postmenopausal): Secondary | ICD-10-CM | POA: Diagnosis not present

## 2020-03-01 DIAGNOSIS — Z885 Allergy status to narcotic agent status: Secondary | ICD-10-CM | POA: Diagnosis not present

## 2020-03-01 DIAGNOSIS — Z6838 Body mass index (BMI) 38.0-38.9, adult: Secondary | ICD-10-CM | POA: Insufficient documentation

## 2020-03-01 DIAGNOSIS — I10 Essential (primary) hypertension: Secondary | ICD-10-CM | POA: Insufficient documentation

## 2020-03-01 DIAGNOSIS — E039 Hypothyroidism, unspecified: Secondary | ICD-10-CM | POA: Insufficient documentation

## 2020-03-01 DIAGNOSIS — E669 Obesity, unspecified: Secondary | ICD-10-CM | POA: Insufficient documentation

## 2020-03-01 DIAGNOSIS — E785 Hyperlipidemia, unspecified: Secondary | ICD-10-CM | POA: Diagnosis not present

## 2020-03-01 DIAGNOSIS — Z79899 Other long term (current) drug therapy: Secondary | ICD-10-CM | POA: Insufficient documentation

## 2020-03-01 DIAGNOSIS — I471 Supraventricular tachycardia: Secondary | ICD-10-CM | POA: Diagnosis not present

## 2020-03-01 HISTORY — PX: SVT ABLATION: EP1225

## 2020-03-01 SURGERY — SVT ABLATION

## 2020-03-01 MED ORDER — ALPRAZOLAM 0.25 MG PO TABS: 0.2500 mg | ORAL_TABLET | Freq: Every day | ORAL | Status: DC | PRN

## 2020-03-01 MED ORDER — MIDAZOLAM HCL 5 MG/5ML IJ SOLN
INTRAMUSCULAR | Status: AC
  Filled 2020-03-01: qty 5

## 2020-03-01 MED ORDER — BUPIVACAINE HCL (PF) 0.25 % IJ SOLN
INTRAMUSCULAR | Status: DC | PRN
  Administered 2020-03-01: 40 mL

## 2020-03-01 MED ORDER — FENTANYL CITRATE (PF) 100 MCG/2ML IJ SOLN
INTRAMUSCULAR | Status: DC | PRN
  Administered 2020-03-01: 25 ug via INTRAVENOUS
  Administered 2020-03-01: 12.5 ug via INTRAVENOUS
  Administered 2020-03-01 (×2): 25 ug via INTRAVENOUS
  Administered 2020-03-01 (×2): 12.5 ug via INTRAVENOUS
  Administered 2020-03-01 (×3): 25 ug via INTRAVENOUS
  Administered 2020-03-01: 12.5 ug via INTRAVENOUS
  Administered 2020-03-01: 25 ug via INTRAVENOUS

## 2020-03-01 MED ORDER — METOPROLOL TARTRATE 25 MG PO TABS
25.0000 mg | ORAL_TABLET | Freq: Two times a day (BID) | ORAL | Status: DC
  Administered 2020-03-01 – 2020-03-02 (×2): 25 mg via ORAL
  Filled 2020-03-01 (×2): qty 1

## 2020-03-01 MED ORDER — MAGNESIUM OXIDE 400 (241.3 MG) MG PO TABS: 400.0000 mg | ORAL_TABLET | Freq: Every evening | ORAL | Status: DC | PRN

## 2020-03-01 MED ORDER — HYDROCHLOROTHIAZIDE 12.5 MG PO CAPS
12.5000 mg | ORAL_CAPSULE | Freq: Every day | ORAL | Status: DC
  Administered 2020-03-01 – 2020-03-02 (×2): 12.5 mg via ORAL
  Filled 2020-03-01: qty 1

## 2020-03-01 MED ORDER — CALCIUM CARBONATE 1250 (500 CA) MG PO TABS
500.0000 mg | ORAL_TABLET | Freq: Every day | ORAL | Status: DC
  Administered 2020-03-02: 500 mg via ORAL
  Filled 2020-03-01: qty 1

## 2020-03-01 MED ORDER — SODIUM CHLORIDE 0.9 % IV SOLN: 250.0000 mL | INTRAVENOUS | Status: DC | PRN

## 2020-03-01 MED ORDER — LEVOTHYROXINE SODIUM 25 MCG PO TABS
125.0000 ug | ORAL_TABLET | Freq: Every day | ORAL | Status: DC
  Administered 2020-03-02: 125 ug via ORAL
  Filled 2020-03-01: qty 1

## 2020-03-01 MED ORDER — CELECOXIB 200 MG PO CAPS
200.0000 mg | ORAL_CAPSULE | Freq: Every day | ORAL | Status: DC
  Administered 2020-03-02: 200 mg via ORAL
  Filled 2020-03-01: qty 1

## 2020-03-01 MED ORDER — FLUTICASONE PROPIONATE 50 MCG/ACT NA SUSP
1.0000 | Freq: Every day | NASAL | Status: DC | PRN
  Filled 2020-03-01: qty 16

## 2020-03-01 MED ORDER — FENTANYL CITRATE (PF) 100 MCG/2ML IJ SOLN
INTRAMUSCULAR | Status: AC
  Filled 2020-03-01: qty 2

## 2020-03-01 MED ORDER — POTASSIUM 99 MG PO TABS: 99.0000 mg | ORAL_TABLET | Freq: Every evening | ORAL | Status: DC | PRN

## 2020-03-01 MED ORDER — SODIUM CHLORIDE 0.9% FLUSH
3.0000 mL | Freq: Two times a day (BID) | INTRAVENOUS | Status: DC
  Administered 2020-03-01: 3 mL via INTRAVENOUS

## 2020-03-01 MED ORDER — ADULT MULTIVITAMIN W/MINERALS CH
1.0000 | ORAL_TABLET | Freq: Every day | ORAL | Status: DC
  Administered 2020-03-02: 1 via ORAL
  Filled 2020-03-01: qty 1

## 2020-03-01 MED ORDER — GLUCOSAMINE CHONDR 1500 COMPLX PO CAPS: 1.0000 | ORAL_CAPSULE | Freq: Every day | ORAL | Status: DC

## 2020-03-01 MED ORDER — ISOPROTERENOL HCL 0.2 MG/ML IJ SOLN
INTRAMUSCULAR | Status: AC
  Filled 2020-03-01: qty 5

## 2020-03-01 MED ORDER — MIDAZOLAM HCL 5 MG/5ML IJ SOLN
INTRAMUSCULAR | Status: DC | PRN
  Administered 2020-03-01 (×5): 1 mg via INTRAVENOUS
  Administered 2020-03-01: 2 mg via INTRAVENOUS
  Administered 2020-03-01 (×4): 1 mg via INTRAVENOUS

## 2020-03-01 MED ORDER — ISOPROTERENOL HCL 0.2 MG/ML IJ SOLN
INTRAVENOUS | Status: DC | PRN
  Administered 2020-03-01: 2 ug/min via INTRAVENOUS

## 2020-03-01 MED ORDER — VITAMIN D 25 MCG (1000 UNIT) PO TABS
2000.0000 [IU] | ORAL_TABLET | Freq: Every day | ORAL | Status: DC
  Administered 2020-03-02: 2000 [IU] via ORAL
  Filled 2020-03-01 (×2): qty 2

## 2020-03-01 MED ORDER — MELATONIN 3 MG PO TABS: 3.0000 mg | ORAL_TABLET | Freq: Every evening | ORAL | Status: DC | PRN

## 2020-03-01 MED ORDER — LISINOPRIL-HYDROCHLOROTHIAZIDE 20-12.5 MG PO TABS: 1.0000 | ORAL_TABLET | Freq: Every day | ORAL | Status: DC

## 2020-03-01 MED ORDER — SODIUM CHLORIDE 0.9% FLUSH: 3.0000 mL | INTRAVENOUS | Status: DC | PRN

## 2020-03-01 MED ORDER — ACETAMINOPHEN 325 MG PO TABS
650.0000 mg | ORAL_TABLET | ORAL | Status: DC | PRN
  Administered 2020-03-01: 650 mg via ORAL
  Filled 2020-03-01: qty 2

## 2020-03-01 MED ORDER — BUPIVACAINE HCL (PF) 0.25 % IJ SOLN
INTRAMUSCULAR | Status: AC
  Filled 2020-03-01: qty 60

## 2020-03-01 MED ORDER — SODIUM CHLORIDE 0.9 % IV SOLN: INTRAVENOUS | Status: DC

## 2020-03-01 MED ORDER — LISINOPRIL 10 MG PO TABS
20.0000 mg | ORAL_TABLET | Freq: Every day | ORAL | Status: DC
  Administered 2020-03-01 – 2020-03-02 (×2): 20 mg via ORAL
  Filled 2020-03-01 (×2): qty 2

## 2020-03-01 MED ORDER — HEPARIN (PORCINE) IN NACL 1000-0.9 UT/500ML-% IV SOLN
INTRAVENOUS | Status: DC | PRN
  Administered 2020-03-01: 500 mL

## 2020-03-01 MED ORDER — HEPARIN (PORCINE) IN NACL 1000-0.9 UT/500ML-% IV SOLN
INTRAVENOUS | Status: AC
  Filled 2020-03-01: qty 500

## 2020-03-01 MED ORDER — ONDANSETRON HCL 4 MG/2ML IJ SOLN: 4.0000 mg | Freq: Four times a day (QID) | INTRAMUSCULAR | Status: DC | PRN

## 2020-03-01 MED ORDER — ZOLPIDEM TARTRATE 5 MG PO TABS
5.0000 mg | ORAL_TABLET | Freq: Every evening | ORAL | Status: DC | PRN
  Administered 2020-03-01: 5 mg via ORAL
  Filled 2020-03-01: qty 1

## 2020-03-01 SURGICAL SUPPLY — 12 items
CATH DECANAV F CURVE (CATHETERS) ×2 IMPLANT
CATH EZ STEER NAV 4MM D-F CUR (ABLATOR) ×2 IMPLANT
CATH JOSEPH QUAD ALLRED 6F REP (CATHETERS) ×4 IMPLANT
DEVICE CLOSURE PERCLS PRGLD 6F (VASCULAR PRODUCTS) ×4 IMPLANT
PACK EP LATEX FREE (CUSTOM PROCEDURE TRAY) ×2
PACK EP LF (CUSTOM PROCEDURE TRAY) ×1 IMPLANT
PAD PRO RADIOLUCENT 2001M-C (PAD) ×2 IMPLANT
PATCH CARTO3 (PAD) ×2 IMPLANT
PERCLOSE PROGLIDE 6F (VASCULAR PRODUCTS) ×8
SHEATH PINNACLE 6F 10CM (SHEATH) ×4 IMPLANT
SHEATH PINNACLE 7F 10CM (SHEATH) ×2 IMPLANT
SHEATH PINNACLE 8F 10CM (SHEATH) ×2 IMPLANT

## 2020-03-01 NOTE — H&P (Signed)
Paula Cline has presented today for surgery, with the diagnosis of SVT.  The various methods of treatment have been discussed with the patient and family. After consideration of risks, benefits and other options for treatment, the patient has consented to  Procedure(s): Catheter ablaiton as a surgical intervention .  Risks include but not limited to bleeding, tamponade, heart block, stroke, damage to surrounding organs, among others. The patient's history has been reviewed, patient examined, no change in status, stable for surgery.  I have reviewed the patient's chart and labs.  Questions were answered to the patient's satisfaction.    Holden Draughon Curt Bears, MD 03/01/2020 11:43 AM

## 2020-03-01 NOTE — Progress Notes (Signed)
Pt arrived to rm 4E09. Initiated tele. Assessment performed. VSS. Call bell within reach.   Lavenia Atlas, RN

## 2020-03-02 ENCOUNTER — Ambulatory Visit: Payer: BC Managed Care – PPO | Admitting: Family Medicine

## 2020-03-02 DIAGNOSIS — E039 Hypothyroidism, unspecified: Secondary | ICD-10-CM | POA: Diagnosis not present

## 2020-03-02 DIAGNOSIS — Z79899 Other long term (current) drug therapy: Secondary | ICD-10-CM | POA: Diagnosis not present

## 2020-03-02 DIAGNOSIS — Z6838 Body mass index (BMI) 38.0-38.9, adult: Secondary | ICD-10-CM | POA: Diagnosis not present

## 2020-03-02 DIAGNOSIS — I471 Supraventricular tachycardia: Secondary | ICD-10-CM | POA: Diagnosis not present

## 2020-03-02 DIAGNOSIS — Z885 Allergy status to narcotic agent status: Secondary | ICD-10-CM | POA: Diagnosis not present

## 2020-03-02 DIAGNOSIS — Z79890 Hormone replacement therapy (postmenopausal): Secondary | ICD-10-CM | POA: Diagnosis not present

## 2020-03-02 DIAGNOSIS — I10 Essential (primary) hypertension: Secondary | ICD-10-CM | POA: Diagnosis not present

## 2020-03-02 DIAGNOSIS — E669 Obesity, unspecified: Secondary | ICD-10-CM | POA: Diagnosis not present

## 2020-03-02 DIAGNOSIS — E785 Hyperlipidemia, unspecified: Secondary | ICD-10-CM | POA: Diagnosis not present

## 2020-03-02 MED ORDER — SODIUM CHLORIDE 0.9 % IV BOLUS
500.0000 mL | Freq: Once | INTRAVENOUS | Status: AC
  Administered 2020-03-02: 500 mL via INTRAVENOUS

## 2020-03-02 NOTE — Discharge Summary (Addendum)
ELECTROPHYSIOLOGY PROCEDURE DISCHARGE SUMMARY    Patient ID: Paula Cline,  MRN: [id number], DOB/AGE: 64-01-1956 64 y.o.  Admit date: 03/01/2020 Discharge date: 03/02/2020  Primary Care Physician: Denita Lung, MD  Primary Cardiologist: Dr. Oval Linsey Electrophysiologist: Dr. 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 HTN 2. HLD 3. Obese 4. hypopthyroid  Allergies  Allergen Reactions   Hydrocodone Itching     Procedures This Admission: 1.  Electrophysiology study and radiofrequency catheter ablation on 03/01/2020 by Dr Curt Bears   This study demonstrated   CONCLUSIONS:  1. Sinus rhythm upon presentation.  2. The patient had dual AV nodal physiology with easily inducible classic AV nodal reentrant tachycardia, there were no other accessory pathways or arrhythmias induced  3. Successful radiofrequency modification of the slow AV nodal pathway  4. No inducible arrhythmias following ablation.  5. No early apparent complications.   Brief HPI: Paula Cline is a 64 y.o. female with a past medical history as outlined above.  She has had increasing tachypalpitations with documented SVT.  They have failed medical therapy with metoprolol.  Risks, benefits, and alternatives to ablation were reviewed with the patient who wished to proceed.   Hospital Course:  The patient was admitted and underwent EPS/RFCA with details as outlined above. She was monitored on telemetry overnight which demonstrated SR.  Procedure site is stable. The patient was examined by Dr. Curt Bears and considered stable for discharge to home.  Follow up is in place for 4 weeks.  Wound care and restrictions were reviewed with the patient prior to discharge.   Physical Exam: Vitals:   03/02/20 0100 03/02/20 0216 03/02/20 0433 03/02/20 0436  BP:  (!) 123/54 114/60 117/60  Pulse:  68 62 (!) 57  Resp:  16 17 15     Temp:   98.3 F (36.8 C) 98.3 F (36.8 C)  TempSrc:   Oral Oral  SpO2: 98% 98% 99% 99%  Weight:      Height:        GEN- The patient is well appearing, alert and oriented x 3 today.   HEENT: normocephalic, atraumatic; sclera clear, conjunctiva pink; hearing intact; oropharynx clear; neck supple, no JVP Lymph- no cervical lymphadenopathy Lungs- CTA b/l, normal work of breathing.  No wheezes, rales, rhonchi Heart- RRR, no murmurs, rubs or gallops, PMI not laterally displaced GI- soft, non-tender, non-distended, obese Extremities- no clubbing, cyanosis, or edema; DP/PT/radial pulses 2+ bilaterally, b/l groin sites are without hematoma/bruit MS- no significant deformity or atrophy Skin- warm and dry, no rash or lesion Psych- euthymic mood, full affect Neuro- strength and sensation are intact   Discharge Vitals: Blood pressure 117/60, pulse (!) 57, temperature 98.3 F (36.8 C), temperature source Oral, resp. rate 15, height 5\' 2"  (1.575 m), weight 94.3 kg   Labs:   Lab Results  Component Value Date   WBC 10.4 01/31/2020   HGB 13.6 01/31/2020   HCT 42.0 01/31/2020   MCV 88 01/31/2020   PLT 291 01/31/2020   No results for input(s): NA, K, CL, CO2, BUN, CREATININE, CALCIUM, PROT, BILITOT, ALKPHOS, ALT, AST, GLUCOSE in the last 168 hours.  Invalid input(s): LABALBU  Discharge Medications:  Allergies as of 03/02/2020       Reactions   Hydrocodone Itching        Medication List     TAKE these medications    ALPRAZolam 0.25 MG tablet Commonly known  as: XANAX TAKE ONE TABLET BY MOUTH DAILY AS NEEDED FOR ANXIETY What changed: See the new instructions.   calcium carbonate 1500 (600 Ca) MG Tabs tablet Commonly known as: OSCAL Take 600 mg of elemental calcium by mouth daily.   celecoxib 200 MG capsule Commonly known as: CELEBREX Take 1 capsule (200 mg total) by mouth daily. Reported on 01/18/2016   Eszopiclone 3 MG Tabs TAKE ONE TABLET BY MOUTH DAILY IMMEDIATELY BEFORE  BEDTIME What changed: See the new instructions.   Flonase 50 MCG/ACT nasal spray Generic drug: fluticasone Place 1 spray into both nostrils daily as needed for allergies or rhinitis.   Glucosamine Chondr 1500 Complx Caps Take 1 capsule by mouth daily.   lisinopril-hydrochlorothiazide 20-12.5 MG tablet Commonly known as: ZESTORETIC Take 1 tablet by mouth daily.   LUBRICATING EYE DROPS OP Place 1 drop into both eyes daily as needed (dry eyes).   Magnesium 400 MG Caps Take 400 mg by mouth at bedtime as needed (muscle relaxation).   Melatonin 3 MG Caps Take 3 mg by mouth at bedtime as needed (sleep).   metoprolol tartrate 25 MG tablet Commonly known as: LOPRESSOR TAKE ONE TABLET BY MOUTH TWICE A DAY   Potassium 99 MG Tabs Take 99 mg by mouth at bedtime as needed (muscle relaxation).   Synthroid 125 MCG tablet Generic drug: levothyroxine TAKE ONE TABLET BY MOUTH DAILY What changed:  how much to take when to take this   TURMERIC CURCUMIN PO Take 1 capsule by mouth daily.   valACYclovir 1000 MG tablet Commonly known as: VALTREX TAKE 2 TABLETS BY MOUTH TWICE DAILY FOR ONE DAY; THEN AS DIRECTED BY YOUR DOCTOR What changed:  how much to take how to take this when to take this reasons to take this additional instructions   Vitamin D 50 MCG (2000 UT) tablet Take 2,000 Units by mouth daily.   Voltaren 1 % Gel Generic drug: diclofenac sodium Apply 1 application topically 2 (two) times daily as needed (knee pain).   Womens 50+ Multi Vitamin/Min Tabs Take 1 tablet by mouth daily.        Disposition: HOme Discharge Instructions     Diet - low sodium heart healthy   Complete by: As directed    Increase activity slowly   Complete by: As directed       Follow-up Information     Constance Haw, MD Follow up.   Specialty: Cardiology Why: 04/18/2020 @ 2:30PM Contact information: [place] [id number] [phone]             Duration of Discharge Encounter: Greater than 30 minutes including physician time.  Yahayra, [place], PA-C 03/02/2020 9:32 AM  I have seen and examined this patient with Tommye Standard.  Agree with above, note added to reflect my findings.  On exam, RRR, no murmurs, lungs clear.  Patient status post ablation for AVNRT.  Patient tolerated procedure well.  Feels well this morning.  We Amani Nodarse plan for discharge with follow-up in clinic.  Crue Otero M. Sherah Lund MD 03/02/2020 9:45 AM

## 2020-03-02 NOTE — Discharge Instructions (Signed)
Post procedure care instructions No driving for 4 days. No lifting over 5 lbs for 1 week. No vigorous or sexual activity for 1 week. You may return to work/your usual activities on 03/08/2020. Keep procedure site clean & dry. If you notice increased pain, swelling, bleeding or pus, call/return!  You may shower, but no soaking baths/hot tubs/pools for 1 week.

## 2020-03-22 ENCOUNTER — Ambulatory Visit: Payer: BC Managed Care – PPO | Admitting: Family Medicine

## 2020-04-18 ENCOUNTER — Encounter: Payer: Self-pay | Admitting: Cardiology

## 2020-04-18 ENCOUNTER — Other Ambulatory Visit: Payer: Self-pay

## 2020-04-18 ENCOUNTER — Ambulatory Visit: Payer: BC Managed Care – PPO | Admitting: Cardiology

## 2020-04-18 VITALS — BP 118/64 | HR 60 | Ht 62.0 in | Wt 212.0 lb

## 2020-04-18 DIAGNOSIS — I471 Supraventricular tachycardia: Secondary | ICD-10-CM

## 2020-04-18 NOTE — Progress Notes (Signed)
Electrophysiology Office Note   Date:  04/18/2020   ID:  EL VANWINGERDEN, DOB [birth year], MRN [id number]  PCP:  Denita Lung, MD  Cardiologist:  Oval Linsey Primary Electrophysiologist:  Ehsan Corvin Meredith Leeds, MD    Chief Complaint: SVT   History of Present Illness: Paula Cline is a 64 y.o. female who is being seen today for the evaluation of SVT at the request of Denita Lung, MD. Presenting today for electrophysiology evaluation.  He has a history of hypertension, hyperlipidemia, former tobacco abuse, hypothyroidism, and obesity.  She had been having palpitations and dizziness for several months.  She presented to the emergency room 10/26/2019.  She had been having several months of intermittent episodes of palpitations and presyncope.  The longest occurred for 30 minutes.  Prior to her hospital admission, this was happening twice a week.  Her PCP found her to be in SVT with heart rates of 185 bpm.  She is now status post SVT ablation on 03/01/2020.  Today, denies symptoms of palpitations, chest pain, shortness of breath, orthopnea, PND, lower extremity edema, claudication, dizziness, presyncope, syncope, bleeding, or neurologic sequela. The patient is tolerating medications without difficulties.  Since her ablation she has done well.  She has had no further palpitations.  She does not feel that she has been back into SVT.  She is back able to do all of her daily activities.  Her only complaint is mild fatigue.   Past Medical History:  Diagnosis Date   Allergy    RHINITIS   Arthritis    Diverticulitis    Former smoker    Hypercholesteremia    Hypertension    Obesity    SVT (supraventricular tachycardia) ([place]) 02/2020   Thyroid disease    Past Surgical History:  Procedure Laterality Date   COLONOSCOPY  2007/2008   Dr. Ardis Hughs   KNEE ARTHROSCOPY  2012   Right knee   SVT ABLATION N/A 03/01/2020   Procedure: SVT ABLATION;  Surgeon: Constance Haw, MD;   Location: [facility];  Service: Cardiovascular;  Laterality: N/A;   WISDOM TOOTH EXTRACTION       Current Outpatient Medications  Medication Sig Dispense Refill   ALPRAZolam (XANAX) 0.25 MG tablet TAKE ONE TABLET BY MOUTH DAILY AS NEEDED FOR ANXIETY (Patient taking differently: Take 0.25 mg by mouth daily as needed for anxiety. ) 20 tablet 0   calcium carbonate (OSCAL) 1500 (600 Ca) MG TABS tablet Take 600 mg of elemental calcium by mouth daily.     Carboxymethylcellul-Glycerin (LUBRICATING EYE DROPS OP) Place 1 drop into both eyes daily as needed (dry eyes).     celecoxib (CELEBREX) 200 MG capsule Take 1 capsule (200 mg total) by mouth daily. Reported on 01/18/2016 90 capsule 3   Cholecalciferol (VITAMIN D) 50 MCG (2000 UT) tablet Take 2,000 Units by mouth daily.     diclofenac sodium (VOLTAREN) 1 % GEL Apply 1 application topically 2 (two) times daily as needed (knee pain).      Eszopiclone 3 MG TABS TAKE ONE TABLET BY MOUTH DAILY IMMEDIATELY BEFORE BEDTIME (Patient taking differently: Take 3 mg by mouth at bedtime as needed (sleep). ) 30 tablet 0   fluticasone (FLONASE) 50 MCG/ACT nasal spray Place 1 spray into both nostrils daily as needed for allergies or rhinitis.     Glucosamine-Chondroit-Vit C-Mn (GLUCOSAMINE CHONDR 1500 COMPLX) CAPS Take 1 capsule by mouth daily.     lisinopril-hydrochlorothiazide (ZESTORETIC) 20-12.5 MG tablet Take 1 tablet by  mouth daily. 90 tablet 3   Magnesium 400 MG CAPS Take 400 mg by mouth at bedtime as needed (muscle relaxation).     Melatonin 3 MG CAPS Take 3 mg by mouth at bedtime as needed (sleep).      Multiple Vitamins-Minerals (WOMENS 50+ MULTI VITAMIN/MIN) TABS Take 1 tablet by mouth daily.      Potassium 99 MG TABS Take 99 mg by mouth at bedtime as needed (muscle relaxation).     SYNTHROID 125 MCG tablet TAKE ONE TABLET BY MOUTH DAILY (Patient taking differently: Take 125 mcg by mouth daily before breakfast. ) 90 tablet 2    TURMERIC CURCUMIN PO Take 1 capsule by mouth daily.     valACYclovir (VALTREX) 1000 MG tablet TAKE 2 TABLETS BY MOUTH TWICE DAILY FOR ONE DAY; THEN AS DIRECTED BY YOUR DOCTOR (Patient taking differently: Take 2,000 mg by mouth 2 (two) times daily as needed (fever blisters). ) 12 tablet 5   No current facility-administered medications for this visit.    Allergies:   Hydrocodone   Social History:  The patient  reports that she quit smoking about 26 years ago. Her smoking use included cigarettes. She has never used smokeless tobacco. She reports current alcohol use. She reports that she does not use drugs.   Family History:  The patient's family history includes Alzheimer's disease in her father; CAD in her paternal grandfather; Cancer (age of onset: 80) in her mother; Cancer (age of onset: 2) in her father; Hypertension in her father.    ROS:  Please see the history of present illness.   Otherwise, review of systems is positive for none.   All other systems are reviewed and negative.   PHYSICAL EXAM: VS:  BP 118/64    Pulse 60    Ht 5\' 2"  (1.575 m)    Wt 212 lb (96.2 kg)    LMP 11/18/2010    BMI 38.78 kg/m  , BMI Body mass index is 38.78 kg/m. GEN: Well nourished, well developed, in no acute distress  HEENT: normal  Neck: no JVD, carotid bruits, or masses Cardiac: RRR; no murmurs, rubs, or gallops,no edema  Respiratory:  clear to auscultation bilaterally, normal work of breathing GI: soft, nontender, nondistended, + BS MS: no deformity or atrophy  Skin: warm and dry Neuro:  Strength and sensation are intact Psych: euthymic mood, full affect  EKG:  EKG is ordered today. Personal review of the ekg ordered shows sinus rhythm, rate 60   Recent Labs: 10/27/2019: Magnesium 2.2; TSH 1.873 11/01/2019: ALT 10 01/31/2020: BUN 22; Creatinine, Ser 0.93; Hemoglobin 13.6; Platelets 291; Potassium 4.6; Sodium 137    Lipid Panel     Component Value Date/Time   CHOL 250 (H) 06/03/2019 1008    TRIG 44 06/03/2019 1008   HDL 68 06/03/2019 1008   CHOLHDL 3.7 06/03/2019 1008   CHOLHDL 3.6 01/16/2017 0905   VLDL 8 01/16/2017 0905   LDLCALC 173 (H) 06/03/2019 1008     Wt Readings from Last 3 Encounters:  04/18/20 212 lb (96.2 kg)  03/01/20 208 lb (94.3 kg)  01/31/20 217 lb (98.4 kg)      Other studies Reviewed: Additional studies/ records that were reviewed today include: TTE 10/27/19  Review of the above records today demonstrates:   1. Left ventricular ejection fraction, by visual estimation, is 60 to 65%. The left ventricle has normal function. There is no left ventricular hypertrophy.  2. The left ventricle has no regional wall motion abnormalities.  3. Global right ventricle has normal systolic function.The right ventricular size is normal. No increase in right ventricular wall thickness.  4. Left atrial size was normal.  5. Right atrial size was normal.  6. Presence of pericardial fat pad.  7. Trivial pericardial effusion is present.  8. The mitral valve is grossly normal. Trivial mitral valve regurgitation.  9. The tricuspid valve is grossly normal. Tricuspid valve regurgitation is trivial. 10. The aortic valve is tricuspid. Aortic valve regurgitation is not visualized. No evidence of aortic valve sclerosis or stenosis. 11. The pulmonic valve was grossly normal. Pulmonic valve regurgitation is trivial. 12. Normal pulmonary artery systolic pressure. 13. The tricuspid regurgitant velocity is 2.28 m/s, and with an assumed right atrial pressure of 3 mmHg, the estimated right ventricular systolic pressure is normal at 23.7 mmHg. 14. The inferior vena cava is normal in size with greater than 50% respiratory variability, suggesting right atrial pressure of 3 mmHg. 15. No prior Echocardiogram.   ASSESSMENT AND PLAN:  1.  AVNRT: Status post ablation 03/01/2020.  Not had any further episodes of tachycardia.  She currently feels well though she has some fatigue.  Due to that, we  Videl Nobrega stop her metoprolol.  2.  Hypertension: Currently well controlled  3.  Hyperlipidemia: Currently using lifestyle management.  Plan per primary physician.  Current medicines are reviewed at length with the patient today.   The patient does not have concerns regarding her medicines.  The following changes were made today: Stop metoprolol  Labs/ tests ordered today include:  Orders Placed This Encounter  Procedures   EKG 12-Lead     Disposition:   FU with Baruc Tugwell as needed months  Signed, Yosgart Pavey Meredith Leeds, MD  04/18/2020 3:14 PM     [facility] [address] [place] [address] [phone] (office) [phone] (fax)

## 2020-04-18 NOTE — Patient Instructions (Signed)
Medication Instructions:  Your physician recommends that you continue on your current medications as directed. Please refer to the Current Medication list given to you today.  *If you need a refill on your cardiac medications before your next appointment, please call your pharmacy*   Lab Work: None ordered   Testing/Procedures: None ordered   Follow-Up: At [facility], you and your health needs are our priority.  As part of our continuing mission to provide you with exceptional heart care, we have created designated Provider Care Teams.  These Care Teams include your primary Cardiologist (physician) and Advanced Practice Providers (APPs -  Physician Assistants and Nurse Practitioners) who all work together to provide you with the care you need, when you need it.  We recommend signing up for the patient portal called "MyChart".  Sign up information is provided on this After Visit Summary.  MyChart is used to connect with patients for Virtual Visits (Telemedicine).  Patients are able to view lab/test results, encounter notes, upcoming appointments, etc.  Non-urgent messages can be sent to your provider as well.   To learn more about what you can do with MyChart, go to [email or web address].    Your next appointment:    As needed  The format for your next appointment:   In Person  Provider:   Allegra Lai, MD   Thank you for choosing [facility]!!   Trinidad Curet, RN [phone]    Other Instructions

## 2020-04-21 DIAGNOSIS — Z1231 Encounter for screening mammogram for malignant neoplasm of breast: Secondary | ICD-10-CM | POA: Diagnosis not present

## 2020-04-21 LAB — HM MAMMOGRAPHY

## 2020-04-24 NOTE — Telephone Encounter (Signed)
Dr. Curt Bears, I called the pt to inquire more information about this mychart message she sent to you on 6/4.  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  Pt states the worse episode of palpitations was on 04/21/20.  She has had no further episodes over the weekend, for she states she took it easy and rested.  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  She doesn't work on the weekends, and has no issues at all them. She does associate this with discontinuation of beta blocker.  Pt states when these episodes occur, she does not get dizzy, sob, doe, N/V, or have pre-syncopal or syncopal episodes.  She states that she does get extremely anxious when they occur, and reports she has a strong history of anxiety, which makes it very tough for her to decipher if her issues are anxiety or cardiac in nature.  Pt states she wants you to advise if she should come in the office this week, either on Tuesday or Wednesday to see you about this, or should you advise on starting her back on her beta blocker at its past dose, or a lower dose to use regularly or as needed.  Pt states after Wednesday, she will be heading down to [facility] for a bit, and will be helping her family get their [place] ready.  She reports she would like for this issue to be advised on before then, rather it be bringing her back in to see you, or restarting her metoprolol. Informed the pt that Dr. Curt Bears and his RN are out of the office today, but I will route this message to them to further review and advise on.  Informed the pt that either Dr. Curt Bears RN or a triage nurse will follow-up with her shortly thereafter, once further  recommendations are received.  Advised the pt to make sure she is staying plenty hydrated, especially while working in the heat, and exerting. Advised the pt if her symptoms return and worsen, she should call us to inform us of this,  or seek immediate medical attention.  Pt education provided on S/S that warrant immediate medical attention. Pt verbalized understanding and agrees with this plan.

## 2020-04-25 ENCOUNTER — Other Ambulatory Visit: Payer: Self-pay

## 2020-04-25 ENCOUNTER — Ambulatory Visit: Payer: BC Managed Care – PPO | Admitting: Physician Assistant

## 2020-04-25 VITALS — BP 124/68 | HR 74 | Ht 62.0 in | Wt 211.0 lb

## 2020-04-25 DIAGNOSIS — I471 Supraventricular tachycardia: Secondary | ICD-10-CM | POA: Diagnosis not present

## 2020-04-25 DIAGNOSIS — I10 Essential (primary) hypertension: Secondary | ICD-10-CM

## 2020-04-25 DIAGNOSIS — R002 Palpitations: Secondary | ICD-10-CM | POA: Diagnosis not present

## 2020-04-25 MED ORDER — METOPROLOL TARTRATE 25 MG PO TABS: 12.5000 mg | ORAL_TABLET | Freq: Two times a day (BID) | ORAL | 1 refills | Status: DC

## 2020-04-25 NOTE — Patient Instructions (Addendum)
Medication Instructions:     START TAKING METOPROLOL  12.5 MG TWICE  A DAY   *If you need a refill on your cardiac medications before your next appointment, please call your pharmacy*   Lab Work: NONE ORDERED  TODAY   If you have labs (blood work) drawn today and your tests are completely normal, you will receive your results only by: Marland Kitchen MyChart Message (if you have MyChart) OR . A paper copy in the mail If you have any lab test that is abnormal or we need to change your treatment, we will call you to review the results.   Testing/Procedures: NONE ORDERED  TODAY   Follow-Up: At [facility], you and your health needs are our priority.  As part of our continuing mission to provide you with exceptional heart care, we have created designated Provider Care Teams.  These Care Teams include your primary Cardiologist (physician) and Advanced Practice Providers (APPs -  Physician Assistants and Nurse Practitioners) who all work together to provide you with the care you need, when you need it.  We recommend signing up for the patient portal called "MyChart".  Sign up information is provided on this After Visit Summary.  MyChart is used to connect with patients for Virtual Visits (Telemedicine).  Patients are able to view lab/test results, encounter notes, upcoming appointments, etc.  Non-urgent messages can be sent to your provider as well.   To learn more about what you can do with MyChart, go to [email or web address].    Your next appointment:   6 week(s)  The format for your next appointment:   In Person  Provider:    You may see  Tommye Standard, PA-C    Other Instructions  PLEASE DISCUSS WITH PCP ABOUT CONCERNS ANXIETY   START  EXERCISING TO THE LIMITS OF YOUR  YOUR KNEE TOLERANCE

## 2020-04-25 NOTE — Progress Notes (Signed)
Cardiology Office Note Date:  04/25/2020  Patient ID:  Paula Cline, Paula Cline Feb 07, 1956, MRN [id number] PCP:  Denita Lung, MD  EP:  Dr. Curt Bears     Chief Complaint: palpitations  History of Present Illness: Paula Cline is a 64 y.o. female with history of HTN, HLD, former smoker, hypothyroidism, obesity, SVT.  She comes in today to be seen for Dr. Curt Bears, last seen by him 04/18/20, she was s/p SVT ablation (03/01/20), doing well though somewhat fatigued and her metoprolol was stopped planned to f/u PRN  Patient reached with increased palpitations off her metoprolol.  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  She has done this before without particular symptoms, she does say that it was very hot out though.  She did not stop all together, but took breaks.  Even once home, felt like her HR took a long time to settle down.  It did not feel like her SVT, but was very worried it was going to startup. This was very anxiety provoking for her and since then has been quite worried, often checking her pulse ffeilng some missing beats time to time, taht have her worried as well.   Shenow thinks that her anxiety may be getting the better of her and is worried that she may not be able to tell if she is having anxiety or real symptoms.  No near syncope or syncope.  She thinks she has noted that her exertional capacity.  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  She has started back a little bit and feels like she fatigues easier.    Past Medical History:  Diagnosis Date  . Allergy    RHINITIS  . Arthritis   . Diverticulitis   . Former smoker   . Hypercholesteremia   . Hypertension   . Obesity   . SVT (supraventricular tachycardia) ([facility]) 02/2020  .  Thyroid disease     Past Surgical History:  Procedure Laterality Date  . COLONOSCOPY  2007/2008   Dr. Ardis Hughs  . KNEE ARTHROSCOPY  2012   Right knee  . SVT ABLATION N/A 03/01/2020   Procedure: SVT ABLATION;  Surgeon: Constance Haw, MD;  Location: [facility];  Service: Cardiovascular;  Laterality: N/A;  . WISDOM TOOTH EXTRACTION      Current Outpatient Medications  Medication Sig Dispense Refill  . ALPRAZolam (XANAX) 0.25 MG tablet TAKE ONE TABLET BY MOUTH DAILY AS NEEDED FOR ANXIETY (Patient taking differently: Take 0.25 mg by mouth daily as needed for anxiety. ) 20 tablet 0  . calcium carbonate (OSCAL) 1500 (600 Ca) MG TABS tablet Take 600 mg of elemental calcium by mouth daily.    . Carboxymethylcellul-Glycerin (LUBRICATING EYE DROPS OP) Place 1 drop into both eyes daily as needed (dry eyes).    . celecoxib (CELEBREX) 200 MG capsule Take 1 capsule (200 mg total) by mouth daily. Reported on 01/18/2016 90 capsule 3  . Cholecalciferol (VITAMIN D) 50 MCG (2000 UT) tablet Take 2,000 Units by mouth daily.    . diclofenac sodium (VOLTAREN) 1 % GEL Apply 1 application topically 2 (two) times daily as needed (knee pain).     . Eszopiclone 3 MG TABS TAKE ONE TABLET BY MOUTH DAILY IMMEDIATELY  BEFORE BEDTIME (Patient taking differently: Take 3 mg by mouth at bedtime as needed (sleep). ) 30 tablet 0  . fluticasone (FLONASE) 50 MCG/ACT nasal spray Place 1 spray into both nostrils daily as needed for allergies or rhinitis.    . Glucosamine-Chondroit-Vit C-Mn (GLUCOSAMINE CHONDR 1500 COMPLX) CAPS Take 1 capsule by mouth daily.    Marland Kitchen lisinopril-hydrochlorothiazide (ZESTORETIC) 20-12.5 MG tablet Take 1 tablet by mouth daily. 90 tablet 3  . Magnesium 400 MG CAPS Take 400 mg by mouth at bedtime as needed (muscle relaxation).    . Melatonin 3 MG CAPS Take 3 mg by mouth at bedtime as needed (sleep).     . Multiple Vitamins-Minerals (WOMENS 50+ MULTI VITAMIN/MIN) TABS Take 1 tablet by mouth daily.      . Potassium 99 MG TABS Take 99 mg by mouth at bedtime as needed (muscle relaxation).    . SYNTHROID 125 MCG tablet TAKE ONE TABLET BY MOUTH DAILY (Patient taking differently: Take 125 mcg by mouth daily before breakfast. ) 90 tablet 2  . TURMERIC CURCUMIN PO Take 1 capsule by mouth daily.    . valACYclovir (VALTREX) 1000 MG tablet TAKE 2 TABLETS BY MOUTH TWICE DAILY FOR ONE DAY; THEN AS DIRECTED BY YOUR DOCTOR (Patient taking differently: Take 2,000 mg by mouth 2 (two) times daily as needed (fever blisters). ) 12 tablet 5   No current facility-administered medications for this visit.    Allergies:   Hydrocodone   Social History:  The patient  reports that she quit smoking about 26 years ago. Her smoking use included cigarettes. She has never used smokeless tobacco. She reports current alcohol use. She reports that she does not use drugs.   Family History:  The patient's family history includes Alzheimer's disease in her father; CAD in her paternal grandfather; Cancer (age of onset: 32) in her mother; Cancer (age of onset: 70) in her father; Hypertension in her father.  ROS:  Please see the history of present illness.    All other systems are reviewed and otherwise negative.   PHYSICAL EXAM:  VS:  LMP 11/18/2010  BMI: There is no height or weight on file to calculate BMI. Well nourished, well developed, in no acute distress  HEENT: normocephalic, atraumatic  Neck: no JVD, carotid bruits or masses Cardiac:   RRR; no significant murmurs, no rubs, or gallops Lungs:  CTA b/l, no wheezing, rhonchi or rales  Abd: soft, nontender, obese MS: no deformity or atrophy Ext: no edema  Skin: warm and dry, no rash Neuro:  No gross deficits appreciated Psych: euthymic mood, full affect   EKG:  Done today and reviewed by myself shows  SR 74bpm, no acute or ischemic looking changes    03/01/2020: EPS/Ablation CONCLUSIONS:  1. Sinus rhythm upon presentation.  2. The patient had dual AV nodal  physiology with easily inducible classic AV nodal reentrant tachycardia, there were no other accessory pathways or arrhythmias induced  3. Successful radiofrequency modification of the slow AV nodal pathway  4. No inducible arrhythmias following ablation.  5. No early apparent complications.    TTE 10/27/19  Review of the above records today demonstrates:  1. Left ventricular ejection fraction, by visual estimation, is 60 to 65%. The left ventricle has normal function. There is no left ventricular hypertrophy. 2. The left ventricle has no regional wall motion abnormalities. 3. Global right ventricle has normal systolic function.The right ventricular size is normal. No increase in right ventricular wall thickness. 4. Left atrial size  was normal. 5. Right atrial size was normal. 6. Presence of pericardial fat pad. 7. Trivial pericardial effusion is present. 8. The mitral valve is grossly normal. Trivial mitral valve regurgitation. 9. The tricuspid valve is grossly normal. Tricuspid valve regurgitation is trivial. 10. The aortic valve is tricuspid. Aortic valve regurgitation is not visualized. No evidence of aortic valve sclerosis or stenosis. 11. The pulmonic valve was grossly normal. Pulmonic valve regurgitation is trivial. 12. Normal pulmonary artery systolic pressure. 13. The tricuspid regurgitant velocity is 2.28 m/s, and with an assumed right atrial pressure of 3 mmHg, the estimated right ventricular systolic pressure is normal at 23.7 mmHg. 14. The inferior vena cava is normal in size with greater than 50% respiratory variability, suggesting right atrial pressure of 3 mmHg. 15. No prior Echocardiogram.  Recent Labs: 10/27/2019: Magnesium 2.2; TSH 1.873 11/01/2019: ALT 10 01/31/2020: BUN 22; Creatinine, Ser 0.93; Hemoglobin 13.6; Platelets 291; Potassium 4.6; Sodium 137  06/03/2019: Chol/HDL Ratio 3.7; Cholesterol, Total 250; HDL 68; LDL Calculated 173; Triglycerides 44   CrCl  cannot be calculated (Patient's most recent lab result is older than the maximum 21 days allowed.).   Wt Readings from Last 3 Encounters:  04/18/20 212 lb (96.2 kg)  03/01/20 208 lb (94.3 kg)  01/31/20 217 lb (98.4 kg)     Other studies reviewed: Additional studies/records reviewed today include: summarized above  ASSESSMENT AND PLAN:  1. SVT     AVNRT s/p ablation April 2021     Doubt she has recurrent SVT  Perhaps some reflexive tachycardia off her betablocker   2. HTN     Looks OK   Suspect that deconditioning, obesity is etiology of her functional decline Will resume small dose of her lopressor 12.5mg  BID, this may as well help with some of her anxiety Encouraged to start to exercise to her capacity (with bad knee) She is recommended to speak with her PMD regarding her anxiety management.    Disposition: F/u in 6 weeks, sooner if needed    Current medicines are reviewed at length with the patient today.  The patient did not have any concerns regarding medicines.  Riyan, Gavina, PA-C 04/25/2020 5:26 AM     [place] [address] [phone] (office)  [phone] (fax)

## 2020-04-27 NOTE — Telephone Encounter (Signed)
lmtcb

## 2020-04-28 NOTE — Telephone Encounter (Signed)
Pt reports that she saw Tommye Standard, PA this week and Metoprolol was added back to medication regimen. 6 week f/u on 7/20. Pt will see if improvement in symptoms.  If issue remains and/or SE begin she will call the office to discuss prior to scheduled f/u. Aware that if issues persists monitor may be ordered. Patient verbalized understanding and agreeable to plan.

## 2020-05-09 ENCOUNTER — Other Ambulatory Visit: Payer: Self-pay | Admitting: Cardiology

## 2020-05-10 ENCOUNTER — Other Ambulatory Visit: Payer: Self-pay | Admitting: Family Medicine

## 2020-05-10 NOTE — Telephone Encounter (Signed)
[facility] is requesting to fill pt Paula Cline. Please advise kh

## 2020-05-31 ENCOUNTER — Other Ambulatory Visit: Payer: Self-pay | Admitting: Family Medicine

## 2020-05-31 NOTE — Telephone Encounter (Signed)
[facility] is requesting to fill pt xanax. Please advise [facility] 

## 2020-06-05 NOTE — Progress Notes (Signed)
Cardiology Office Note Date:  06/05/2020  Patient ID:  Paula Cline, Paula Cline 10-06-1956, MRN [id number] PCP:  Denita Lung, MD  EP:  Dr. Curt Bears     Chief Complaint:  Planned f/u  History of Present Illness: Paula Cline is a 64 y.o. female with history of HTN, HLD, former smoker, hypothyroidism, obesity, SVT.  She comes in today to be seen for Dr. Curt Bears, last seen by him 04/18/20, she was s/p SVT ablation (03/01/20), doing well though somewhat fatigued and her metoprolol was stopped planned to f/u PRN  Patient reached with increased palpitations off her metoprolol.   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  She has done this before without particular symptoms, she does say that it was very hot out though.  She did not stop all together, but took breaks.  Even once home, felt like her HR took a long time to settle down.  It did not feel like her SVT, but was very worried it was going to startup. This was very anxiety provoking for her and since then has been quite worried, often checking her pulse feeling some missing beats time to time, that have her worried as well.   She now thinks that her anxiety may be getting the better of her and is worried that she may not be able to tell if she is having anxiety or real symptoms.  No near syncope or syncope.  She thinks she has noted that her exertional capacity.  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  She has started back a little bit and feels like she fatigues easier.  Shew was resumed on low dose lopressor  with perhaps some degree of reflex tachycardia, also noted deconditioned state, obesity all may play a role as well as her anxiety, encouraged to exercise to her capacity with  a known bad knee, discuss her anxiety with her PMD, and we would see her back in 6 weeks.  TODAY She feels GREAT!  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  She was back on the metoprolol 125mg  BID though forgot Sunday and yesterday and this Am did not take any and feels like she would like to stay off it. She has not had any palpitations.   No CP, SOB or DOE. She is very happy with how she is feeling/doing.  Past Medical History:  Diagnosis Date  . Allergy    RHINITIS  . Arthritis   . Diverticulitis   . Former smoker   . Hypercholesteremia   . Hypertension   . Obesity   . SVT (supraventricular tachycardia) ([place]) 02/2020  . Thyroid disease     Past Surgical History:  Procedure Laterality Date  . COLONOSCOPY  2007/2008   Dr. Ardis Hughs  . KNEE ARTHROSCOPY  2012   Right knee  . SVT ABLATION N/A 03/01/2020   Procedure: SVT ABLATION;  Surgeon: Constance Haw, MD;  Location: [facility];  Service: Cardiovascular;  Laterality: N/A;  . WISDOM TOOTH EXTRACTION      Current Outpatient Medications  Medication Sig Dispense Refill  . ALPRAZolam (XANAX) 0.25 MG tablet Take 1 tablet (0.25 mg total) by mouth daily as needed for anxiety. 10 tablet 0  . calcium carbonate (OSCAL) 1500 (600 Ca) MG TABS tablet Take 600 mg of elemental calcium by mouth daily.    . Carboxymethylcellul-Glycerin (LUBRICATING EYE DROPS OP) Place 1 drop into both eyes daily as needed (dry eyes).    . celecoxib (CELEBREX) 200 MG capsule Take 1 capsule (200 mg total) by mouth daily. Reported on 01/18/2016 90 capsule 3  . Cholecalciferol (VITAMIN D) 50 MCG (2000 UT) tablet Take 2,000 Units by mouth daily.    . diclofenac sodium (VOLTAREN) 1 % GEL Apply 1 application topically 2 (two) times daily as needed (knee pain).     . Eszopiclone 3 MG TABS  Take 1 tablet (3 mg total) by mouth at bedtime as needed (sleep). 30 tablet 1  . fluticasone (FLONASE) 50 MCG/ACT nasal spray Place 1 spray into both nostrils daily as needed for allergies or rhinitis.    . Glucosamine-Chondroit-Vit C-Mn (GLUCOSAMINE CHONDR 1500 COMPLX) CAPS Take 1 capsule by mouth daily.    Marland Kitchen lisinopril-hydrochlorothiazide (ZESTORETIC) 20-12.5 MG tablet Take 1 tablet by mouth daily. 90 tablet 3  . Magnesium 400 MG CAPS Take 400 mg by mouth at bedtime as needed (muscle relaxation).    . Melatonin 3 MG CAPS Take 3 mg by mouth at bedtime as needed (sleep).     . metoprolol tartrate (LOPRESSOR) 25 MG tablet TAKE ONE TABLET BY MOUTH TWICE A DAY 180 tablet 2  . Multiple Vitamins-Minerals (WOMENS 50+ MULTI VITAMIN/MIN) TABS Take 1 tablet by mouth daily.     . Potassium 99 MG TABS Take 99 mg by mouth at bedtime as needed (muscle relaxation).    . SYNTHROID 125 MCG tablet TAKE ONE TABLET BY MOUTH DAILY (Patient taking differently: Take 125 mcg by mouth daily before breakfast. ) 90 tablet 2  . TURMERIC CURCUMIN PO Take 1 capsule by mouth daily.    . valACYclovir (VALTREX) 1000 MG tablet TAKE 2 TABLETS BY MOUTH TWICE DAILY FOR ONE DAY; THEN AS DIRECTED BY YOUR DOCTOR (Patient taking differently: Take 2,000 mg by mouth 2 (two) times daily as needed (fever blisters). ) 12 tablet 5   No current facility-administered medications for this visit.    Allergies:   Hydrocodone   Social History:  The patient  reports that she quit smoking about 27 years ago. Her smoking use included cigarettes. She has never used smokeless tobacco. She reports current alcohol use. She reports that she does not use drugs.   Family History:  The patient's family history includes Alzheimer's disease in her father; CAD in her paternal grandfather; Cancer (age of onset: 71) in her mother; Cancer (age of onset: 45) in her father; Hypertension in her father.  ROS:  Please see the history of present illness.    All other  systems are reviewed and otherwise negative.   PHYSICAL EXAM:  VS:  LMP 11/18/2010  BMI: There is no height or weight on file to calculate BMI. Well nourished, well developed, in no acute distress  HEENT: normocephalic, atraumatic  Neck: no JVD, carotid bruits or masses Cardiac:   RRR; no significant murmurs, no rubs, or gallops Lungs:  CTA b/l, no wheezing, rhonchi or rales  Abd: soft, nontender, obese MS: no deformity or atrophy Ext:  no edema  Skin: warm and dry, no rash Neuro:  No gross deficits appreciated Psych: euthymic mood, full  affect   EKG:  Not done today    03/01/2020: EPS/Ablation CONCLUSIONS:  1. Sinus rhythm upon presentation.  2. The patient had dual AV nodal physiology with easily inducible classic AV nodal reentrant tachycardia, there were no other accessory pathways or arrhythmias induced  3. Successful radiofrequency modification of the slow AV nodal pathway  4. No inducible arrhythmias following ablation.  5. No early apparent complications.    TTE 10/27/19  Review of the above records today demonstrates:  1. Left ventricular ejection fraction, by visual estimation, is 60 to 65%. The left ventricle has normal function. There is no left ventricular hypertrophy. 2. The left ventricle has no regional wall motion abnormalities. 3. Global right ventricle has normal systolic function.The right ventricular size is normal. No increase in right ventricular wall thickness. 4. Left atrial size was normal. 5. Right atrial size was normal. 6. Presence of pericardial fat pad. 7. Trivial pericardial effusion is present. 8. The mitral valve is grossly normal. Trivial mitral valve regurgitation. 9. The tricuspid valve is grossly normal. Tricuspid valve regurgitation is trivial. 10. The aortic valve is tricuspid. Aortic valve regurgitation is not visualized. No evidence of aortic valve sclerosis or stenosis. 11. The pulmonic valve was grossly normal. Pulmonic  valve regurgitation is trivial. 12. Normal pulmonary artery systolic pressure. 13. The tricuspid regurgitant velocity is 2.28 m/s, and with an assumed right atrial pressure of 3 mmHg, the estimated right ventricular systolic pressure is normal at 23.7 mmHg. 14. The inferior vena cava is normal in size with greater than 50% respiratory variability, suggesting right atrial pressure of 3 mmHg. 15. No prior Echocardiogram.  Recent Labs: 10/27/2019: Magnesium 2.2; TSH 1.873 11/01/2019: ALT 10 01/31/2020: BUN 22; Creatinine, Ser 0.93; Hemoglobin 13.6; Platelets 291; Potassium 4.6; Sodium 137  No results found for requested labs within last 8760 hours.   CrCl cannot be calculated (Patient's most recent lab result is older than the maximum 21 days allowed.).   Wt Readings from Last 3 Encounters:  04/25/20 211 lb (95.7 kg)  04/18/20 212 lb (96.2 kg)  03/01/20 208 lb (94.3 kg)     Other studies reviewed: Additional studies/records reviewed today include: summarized above  ASSESSMENT AND PLAN:  1. SVT     AVNRT s/p ablation April 2021     No symptoms   2. HTN     Looks OK   She would like to stay off metoprolol, she will take a 1/2 tab today and again in 2-3 days and then stop to wean off.   Disposition: see her back in 60mo, sooner if needed    Current medicines are reviewed at length with the patient today.  The patient did not have any concerns regarding medicines.  Finlay, Mills, PA-C 06/05/2020 7:28 PM     [address] [place] [place] [address] [phone] (office)  [phone] (fax)

## 2020-06-06 ENCOUNTER — Ambulatory Visit: Payer: BC Managed Care – PPO | Admitting: Physician Assistant

## 2020-06-06 ENCOUNTER — Other Ambulatory Visit: Payer: Self-pay

## 2020-06-06 VITALS — BP 128/66 | HR 82 | Ht 62.0 in | Wt 205.0 lb

## 2020-06-06 DIAGNOSIS — I471 Supraventricular tachycardia: Secondary | ICD-10-CM

## 2020-06-06 DIAGNOSIS — I10 Essential (primary) hypertension: Secondary | ICD-10-CM

## 2020-06-06 NOTE — Patient Instructions (Signed)
Medication Instructions:   START TO WEEN OFF METOPROLOL AS DISCUSSED   *If you need a refill on your cardiac medications before your next appointment, please call your pharmacy*   Lab Work: NONE ORDERED  TODAY   If you have labs (blood work) drawn today and your tests are completely normal, you will receive your results only by: Marland Kitchen MyChart Message (if you have MyChart) OR . A paper copy in the mail If you have any lab test that is abnormal or we need to change your treatment, we will call you to review the results.   Testing/Procedures: NONE ORDERED  TODAY    Follow-Up: At [facility], you and your health needs are our priority.  As part of our continuing mission to provide you with exceptional heart care, we have created designated Provider Care Teams.  These Care Teams include your primary Cardiologist (physician) and Advanced Practice Providers (APPs -  Physician Assistants and Nurse Practitioners) who all work together to provide you with the care you need, when you need it.  We recommend signing up for the patient portal called "MyChart".  Sign up information is provided on this After Visit Summary.  MyChart is used to connect with patients for Virtual Visits (Telemedicine).  Patients are able to view lab/test results, encounter notes, upcoming appointments, etc.  Non-urgent messages can be sent to your provider as well.   To learn more about what you can do with MyChart, go to [email or web address].    Your next appointment:   6 month(s)  The format for your next appointment:   In Person  Provider:   You may see Dr Curt Bears or one of the following Advanced Practice Providers on your designated Care Team:    Chanetta Marshall, NP  Tommye Standard, PA-C  Legrand Como "Jonni Sanger" Chalmers Cater, [place]    Other Instructions

## 2020-06-08 ENCOUNTER — Ambulatory Visit: Payer: BC Managed Care – PPO | Admitting: Family Medicine

## 2020-06-08 ENCOUNTER — Other Ambulatory Visit: Payer: Self-pay | Admitting: Family Medicine

## 2020-06-08 ENCOUNTER — Encounter: Payer: Self-pay | Admitting: Family Medicine

## 2020-06-08 ENCOUNTER — Other Ambulatory Visit: Payer: Self-pay

## 2020-06-08 VITALS — BP 128/76 | HR 73 | Temp 96.4°F | Ht 61.0 in | Wt 204.0 lb

## 2020-06-08 DIAGNOSIS — E039 Hypothyroidism, unspecified: Secondary | ICD-10-CM

## 2020-06-08 DIAGNOSIS — I471 Supraventricular tachycardia, unspecified: Secondary | ICD-10-CM

## 2020-06-08 DIAGNOSIS — Z860100 Personal history of colon polyps, unspecified: Secondary | ICD-10-CM

## 2020-06-08 DIAGNOSIS — B001 Herpesviral vesicular dermatitis: Secondary | ICD-10-CM

## 2020-06-08 DIAGNOSIS — I10 Essential (primary) hypertension: Secondary | ICD-10-CM

## 2020-06-08 DIAGNOSIS — E669 Obesity, unspecified: Secondary | ICD-10-CM | POA: Diagnosis not present

## 2020-06-08 DIAGNOSIS — Z0000 Encounter for general adult medical examination without abnormal findings: Secondary | ICD-10-CM | POA: Diagnosis not present

## 2020-06-08 DIAGNOSIS — J309 Allergic rhinitis, unspecified: Secondary | ICD-10-CM

## 2020-06-08 DIAGNOSIS — M1990 Unspecified osteoarthritis, unspecified site: Secondary | ICD-10-CM

## 2020-06-08 DIAGNOSIS — E785 Hyperlipidemia, unspecified: Secondary | ICD-10-CM

## 2020-06-08 DIAGNOSIS — Z6838 Body mass index (BMI) 38.0-38.9, adult: Secondary | ICD-10-CM

## 2020-06-08 DIAGNOSIS — I700 Atherosclerosis of aorta: Secondary | ICD-10-CM

## 2020-06-08 LAB — POCT URINALYSIS DIP (PROADVANTAGE DEVICE)
Bilirubin, UA: NEGATIVE
Blood, UA: NEGATIVE
Glucose, UA: NEGATIVE mg/dL
Ketones, POC UA: NEGATIVE mg/dL
Leukocytes, UA: NEGATIVE
Nitrite, UA: NEGATIVE
Protein Ur, POC: NEGATIVE mg/dL
Specific Gravity, Urine: 1.02
Urobilinogen, Ur: 0.2
pH, UA: 6 (ref 5.0–8.0)

## 2020-06-08 MED ORDER — CELECOXIB 200 MG PO CAPS: 200.0000 mg | ORAL_CAPSULE | Freq: Every day | ORAL | 3 refills | Status: DC

## 2020-06-08 MED ORDER — LISINOPRIL-HYDROCHLOROTHIAZIDE 20-12.5 MG PO TABS: 1.0000 | ORAL_TABLET | Freq: Every day | ORAL | 3 refills | Status: DC

## 2020-06-08 MED ORDER — ATORVASTATIN CALCIUM 20 MG PO TABS: 20.0000 mg | ORAL_TABLET | Freq: Every day | ORAL | 3 refills | Status: DC

## 2020-06-08 MED ORDER — SYNTHROID 125 MCG PO TABS: 125.0000 ug | ORAL_TABLET | Freq: Every day | ORAL | 2 refills | Status: DC

## 2020-06-08 NOTE — Telephone Encounter (Signed)
[facility] is requesting to fill pt valtrex. Please advise [facility]

## 2020-06-08 NOTE — Progress Notes (Signed)
   Subjective:    Patient ID: Paula Cline, female    DOB: 1956-08-07, 65 y.o.   MRN: [id number]  HPI She is here for complete examination.  She recently had an SVT ablation which help with her underlying dizziness.  She is now taking much better care of herself and involved in a regular exercise program.  She has lost some weight because of this.  Does have a history of colonic polyp and will be scheduled for another colonoscopy in 2023.  She has had a mammogram this year.  She is now involved in a relationship which seems to be going well.  She continues on Synthroid and is having no difficulty with that.  She is no longer taking metoprolol.  Continues on lisinopril/HCTZ.  Her allergies seem to be under good control presently she is not taking any medication.  She does have underlying arthritis and does use Celebrex regularly.  She will eventually need a knee replacement.  She does have a history of hyperlipidemia however on some of her recent evaluation, atherosclerosis was noted.   Review of Systems  All other systems reviewed and are negative.      Objective:   Physical Exam Alert and in no distress. Tympanic membranes and canals are normal. Pharyngeal area is normal. Neck is supple without adenopathy or thyromegaly. Cardiac exam shows a regular sinus rhythm without murmurs or gallops. Lungs are clear to auscultation. Abdominal exam shows no masses or tenderness with normal bowel sounds.       Assessment & Plan:  Routine general medical examination at a health care facility - Plan: POCT Urinalysis DIP (Proadvantage Device)  Hypothyroidism, unspecified type - Plan: TSH, SYNTHROID 125 MCG tablet  Hyperlipidemia with target LDL less than 100 - Plan: atorvastatin (LIPITOR) 20 MG tablet, Lipid panel, CANCELED: Lipid panel  Essential hypertension - Plan: lisinopril-hydrochlorothiazide (ZESTORETIC) 20-12.5 MG tablet  Class 2 obesity without serious comorbidity with body mass index (BMI)  of 38.0 to 38.9 in adult, unspecified obesity type  Allergic rhinitis, mild  Atherosclerosis of aorta (HCC) - Plan: atorvastatin (LIPITOR) 20 MG tablet, Lipid panel  Arthritis - Plan: celecoxib (CELEBREX) 200 MG capsule  SVT (supraventricular tachycardia) (HCC) - ablation  History of colonic polyps  Allergic rhinitis, unspecified seasonality, unspecified trigger I congratulated her on getting involved in an exercise program.  Discussed cutting back on carbohydrates. Then discussed the atherosclerosis in her lipids and will place her on Lipitor.  She is to return here in 2 months for a follow-up. Allergies seem to be under good control so no intervention needed. She will have a colonoscopy in 2023. Follow-up with cardiology as per previously scheduled. I then discussed the need for her to use Tylenol first for her arthritis pain and use Celebrex as a backup.

## 2020-06-08 NOTE — Telephone Encounter (Signed)
[facility] is requesting to fill pt valtrex. Please advise Kh

## 2020-06-09 LAB — LIPID PANEL
Chol/HDL Ratio: 3.8 ratio (ref 0.0–4.4)
Cholesterol, Total: 235 mg/dL — ABNORMAL HIGH (ref 100–199)
HDL: 62 mg/dL (ref >39)
LDL Chol Calc (NIH): 163 mg/dL — ABNORMAL HIGH (ref 0–99)
Triglycerides: 59 mg/dL (ref 0–149)
VLDL Cholesterol Cal: 10 mg/dL (ref 5–40)

## 2020-06-09 LAB — TSH: TSH: 0.333 u[IU]/mL — ABNORMAL LOW (ref 0.450–4.500)

## 2020-08-02 DIAGNOSIS — M170 Bilateral primary osteoarthritis of knee: Secondary | ICD-10-CM | POA: Diagnosis not present

## 2020-08-09 DIAGNOSIS — M170 Bilateral primary osteoarthritis of knee: Secondary | ICD-10-CM | POA: Diagnosis not present

## 2020-08-16 DIAGNOSIS — M170 Bilateral primary osteoarthritis of knee: Secondary | ICD-10-CM | POA: Diagnosis not present

## 2020-08-20 ENCOUNTER — Other Ambulatory Visit: Payer: Self-pay | Admitting: Family Medicine

## 2020-08-21 NOTE — Telephone Encounter (Signed)
Is this okay to refill/. Pt had cpe back in july

## 2020-08-31 ENCOUNTER — Telehealth: Payer: Self-pay | Admitting: Family Medicine

## 2020-08-31 NOTE — Telephone Encounter (Signed)
Pt coming in tomorrow for her flu shot shot and wanted to see is she is eligible to get her covid booster. She had her 2nd pfizer shot April 7th.

## 2020-08-31 NOTE — Telephone Encounter (Signed)
She can get both

## 2020-09-01 ENCOUNTER — Other Ambulatory Visit: Payer: Self-pay

## 2020-09-01 ENCOUNTER — Other Ambulatory Visit (INDEPENDENT_AMBULATORY_CARE_PROVIDER_SITE_OTHER): Payer: BC Managed Care – PPO

## 2020-09-01 DIAGNOSIS — Z23 Encounter for immunization: Secondary | ICD-10-CM | POA: Diagnosis not present

## 2020-10-17 ENCOUNTER — Telehealth (INDEPENDENT_AMBULATORY_CARE_PROVIDER_SITE_OTHER): Payer: BC Managed Care – PPO | Admitting: Family Medicine

## 2020-10-17 ENCOUNTER — Encounter: Payer: Self-pay | Admitting: Family Medicine

## 2020-10-17 ENCOUNTER — Other Ambulatory Visit: Payer: Self-pay

## 2020-10-17 VITALS — BP 120/80 | Wt 204.0 lb

## 2020-10-17 DIAGNOSIS — R059 Cough, unspecified: Secondary | ICD-10-CM | POA: Diagnosis not present

## 2020-10-17 DIAGNOSIS — J309 Allergic rhinitis, unspecified: Secondary | ICD-10-CM | POA: Diagnosis not present

## 2020-10-17 MED ORDER — BENZONATATE 200 MG PO CAPS: 200.0000 mg | ORAL_CAPSULE | Freq: Two times a day (BID) | ORAL | 0 refills | Status: DC | PRN

## 2020-10-17 NOTE — Progress Notes (Signed)
   Subjective:    Patient ID: TAMESHIA BONNEVILLE, female    DOB: July 30, 1956, 64 y.o.   MRN: [id number]  HPI An attempt was made to connect with caregility however technical difficulties made it impossible.  The encounter was carried out by phone.  Identified the client correctly.  She is at home and I am in my office. She complains of a 10-day history of cough and postnasal drainage.  This started after she did a lot of leaf raking.  She does have an underlying history of allergies usually in the spring and fall.  She does note that the coughing is worse at night.  No fever, chills, sore throat, earache.  She did try Zyrtec but only briefly.  Review of Systems     Objective:   Physical Exam Alert and in no distress.  Her voice sounds normal.      Assessment & Plan:  Cough - Plan: benzonatate (TESSALON) 200 MG capsule  Allergic rhinitis, mild Recommend she switch to Claritin and I will give her Tessalon to help with the coughing.  She will do this for the next week and call with further difficulty.  May consider using an inhaler. 20 minutes spent today reviewing her pertinent medical history, medications, problem list coordination of care and medication management.

## 2020-11-05 DIAGNOSIS — S61216A Laceration without foreign body of right little finger without damage to nail, initial encounter: Secondary | ICD-10-CM | POA: Diagnosis not present

## 2020-11-05 DIAGNOSIS — S61411A Laceration without foreign body of right hand, initial encounter: Secondary | ICD-10-CM | POA: Diagnosis not present

## 2020-11-06 DIAGNOSIS — S61411A Laceration without foreign body of right hand, initial encounter: Secondary | ICD-10-CM | POA: Diagnosis not present

## 2020-11-06 DIAGNOSIS — S61216A Laceration without foreign body of right little finger without damage to nail, initial encounter: Secondary | ICD-10-CM | POA: Diagnosis not present

## 2020-11-12 DIAGNOSIS — Z4802 Encounter for removal of sutures: Secondary | ICD-10-CM | POA: Diagnosis not present

## 2020-11-21 NOTE — Progress Notes (Signed)
Virtual Visit via Telephone Note   This visit type was conducted due to national recommendations for restrictions regarding the COVID-19 Pandemic (e.g. social distancing) in an effort to limit this patient's exposure and mitigate transmission in our community.  Due to her co-morbid illnesses, this patient is at least at moderate risk for complications without adequate follow up.  This format is felt to be most appropriate for this patient at this time.  The patient did not have access to video technology/had technical difficulties with video requiring transitioning to audio format only (telephone).  All issues noted in this document were discussed and addressed.  No physical exam could be performed with this format.  Please refer to the patient's chart for her  consent to telehealth for [facility].     Date:  11/27/2020   ID:  Paula Cline, DOB 1956-01-25, MRN [id number] The patient was identified using 2 identifiers.  Patient Location: Home Provider Location: Home Office   Evaluation Performed:  Follow-Up Visit         Cardiology Office Note Date:  11/27/2020  Patient ID:  Paula Cline, Paula Cline Jun 08, 1956, MRN [id number] PCP:  Denita Lung, MD  EP:  Dr. Curt Bears     Chief Complaint:   Planned f/u  History of Present Illness: Paula Cline is a 65 y.o. female with history of HTN, HLD, former smoker, hypothyroidism, obesity, SVT.  She comes in today to be seen for Dr. Curt Bears, last seen by him 04/18/20, she was s/p SVT ablation (03/01/20), doing well though somewhat fatigued and her metoprolol was stopped planned to f/u PRN  Patient reached with increased palpitations off her metoprolol.   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  She has done this before  without particular symptoms, she does say that it was very hot out though.  She did not stop all together, but took breaks.  Even once home, felt like her HR took a long time to settle down.  It did not feel like her SVT, but was very worried it was going to startup. This was very anxiety provoking for her and since then has been quite worried, often checking her pulse feeling some missing beats time to time, that have her worried as well.   She now thinks that her anxiety may be getting the better of her and is worried that she may not be able to tell if she is having anxiety or real symptoms.  No near syncope or syncope.  She thinks she has noted that her exertional capacity.  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  She has started back a little bit and feels like she fatigues easier.  Shew was resumed on low dose lopressor  with perhaps some degree of reflex tachycardia, also noted deconditioned state, obesity all may play a role as well as her anxiety, encouraged to exercise to her capacity with a known bad knee, discuss her anxiety with her PMD, and we would see her back in 6 weeks.  I saw her 06/06/20 She feels GREAT!  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  She was back on the metoprolol 12.5mg  BID though forgot Sunday and yesterday and this Am did not take any and feels like she would like to stay off it. She has not had any palpitations.   No CP, SOB or DOE. She is very happy with how she is feeling/doing. Wanted to come off the metoprolol and planned to wean her off then and RTC 40mo  TODAY She is doing quite well. Walking 1/2 hour on the treadmill every morning at about 3. pace. NO CP, palpitations feels like she has good exertional capacity. No dizzy spells, near syncope  or syncope. When she is upset abut something or really bothered she will since with her anxiety that her HR tends to be higher, though no overt palpitations off the metoprolol. Her BPs generally 120's/80's, admits that she has been a bit liberal with sodium.   Past Medical History:  Diagnosis Date   Allergy    RHINITIS   Arthritis    Diverticulitis    Former smoker    Hypercholesteremia    Hypertension    Obesity    SVT (supraventricular tachycardia) (HCC) 02/2020   Thyroid disease     Past Surgical History:  Procedure Laterality Date   COLONOSCOPY  2007/2008   Dr. Christella Hartigan   KNEE ARTHROSCOPY  2012   Right knee   SVT ABLATION N/A 03/01/2020   Procedure: SVT ABLATION;  Surgeon: Regan Lemming, MD;  Location: [facility];  Service: Cardiovascular;  Laterality: N/A;   WISDOM TOOTH EXTRACTION      Current Outpatient Medications  Medication Sig Dispense Refill   ALPRAZolam (XANAX) 0.25 MG tablet TAKE ONE TABLET BY MOUTH DAILY AS NEEDED FOR ANXIETY 10 tablet 1   atorvastatin (LIPITOR) 20 MG tablet Take 1 tablet (20 mg total) by mouth daily. 90 tablet 3   calcium carbonate (OSCAL) 1500 (600 Ca) MG TABS tablet Take 600 mg of elemental calcium by mouth daily.     Carboxymethylcellul-Glycerin (LUBRICATING EYE DROPS OP) Place 1 drop into both eyes daily as needed (dry eyes).     Cholecalciferol (VITAMIN D) 50 MCG (2000 UT) tablet Take 2,000 Units by mouth daily.     diclofenac sodium (VOLTAREN) 1 % GEL Apply 1 application topically 2 (two) times daily as needed (knee pain).      Eszopiclone 3 MG TABS Take 1 tablet (3 mg total) by mouth at bedtime as needed (sleep). 30 tablet 1   fluticasone (FLONASE) 50 MCG/ACT nasal spray Place 1 spray into both nostrils daily as needed for allergies or rhinitis.     Glucosamine-Chondroit-Vit C-Mn (GLUCOSAMINE CHONDR 1500 COMPLX) CAPS Take 1 capsule by mouth daily.     lisinopril-hydrochlorothiazide (ZESTORETIC) 20-12.5  MG tablet Take 1 tablet by mouth daily. 90 tablet 3   Magnesium 400 MG CAPS Take 400 mg by mouth at bedtime as needed (muscle relaxation).     Melatonin 3 MG CAPS Take 3 mg by mouth at bedtime as needed (sleep).     Multiple Vitamins-Minerals (WOMENS 50+ MULTI VITAMIN/MIN) TABS Take 1 tablet by mouth daily.      Potassium 99 MG TABS Take 99 mg by mouth at bedtime as needed (muscle relaxation).     SYNTHROID 125 MCG tablet Take 1 tablet (125 mcg total) by mouth daily. 90 tablet 2   TURMERIC CURCUMIN PO Take 1 capsule by mouth daily.     valACYclovir (VALTREX) 1000 MG tablet TAKE  TWO TABLETS BY MOUTH TWICE A DAY FOR 1 DAY , THEN AS DIRECTED BY YOUR DOCTOR 12 tablet 4   No current facility-administered medications for this visit.    Allergies:   Codeine and Hydrocodone   Social History:  The patient  reports that she quit smoking about 27 years ago. Her smoking use included cigarettes. She has never used smokeless tobacco. She reports current alcohol use. She reports that she does not use drugs.   Family History:  The patient's family history includes Alzheimer's disease in her father; CAD in her paternal grandfather; Cancer (age of onset: 7) in her mother; Cancer (age of onset: 18) in her father; Hypertension in her father.  ROS:  Please see the history of present illness.    All other systems are reviewed and otherwise negative.   PHYSICAL EXAM:  VS:  BP 127/87    Pulse 76    Ht 5\' 2"  (1.575 m)    Wt 203 lb (92.1 kg)    LMP 11/18/2010    BMI 37.13 kg/m  BMI: Body mass index is 37.13 kg/m. Pt sounds good on the phone, speaks in full sentences at normal pace.  Does not sound SOB  EKG:  Not done today    03/01/2020: EPS/Ablation CONCLUSIONS:  1. Sinus rhythm upon presentation.  2. The patient had dual AV nodal physiology with easily inducible classic AV nodal reentrant tachycardia, there were no other accessory pathways or arrhythmias induced  3. Successful radiofrequency  modification of the slow AV nodal pathway  4. No inducible arrhythmias following ablation.  5. No early apparent complications.    TTE 10/27/19  Review of the above records today demonstrates:  1. Left ventricular ejection fraction, by visual estimation, is 60 to 65%. The left ventricle has normal function. There is no left ventricular hypertrophy. 2. The left ventricle has no regional wall motion abnormalities. 3. Global right ventricle has normal systolic function.The right ventricular size is normal. No increase in right ventricular wall thickness. 4. Left atrial size was normal. 5. Right atrial size was normal. 6. Presence of pericardial fat pad. 7. Trivial pericardial effusion is present. 8. The mitral valve is grossly normal. Trivial mitral valve regurgitation. 9. The tricuspid valve is grossly normal. Tricuspid valve regurgitation is trivial. 10. The aortic valve is tricuspid. Aortic valve regurgitation is not visualized. No evidence of aortic valve sclerosis or stenosis. 11. The pulmonic valve was grossly normal. Pulmonic valve regurgitation is trivial. 12. Normal pulmonary artery systolic pressure. 13. The tricuspid regurgitant velocity is 2.28 m/s, and with an assumed right atrial pressure of 3 mmHg, the estimated right ventricular systolic pressure is normal at 23.7 mmHg. 14. The inferior vena cava is normal in size with greater than 50% respiratory variability, suggesting right atrial pressure of 3 mmHg. 15. No prior Echocardiogram.  Recent Labs: 01/31/2020: BUN 22; Creatinine, Ser 0.93; Hemoglobin 13.6; Platelets 291; Potassium 4.6; Sodium 137 06/08/2020: TSH 0.333  06/08/2020: Chol/HDL Ratio 3.8; Cholesterol, Total 235; HDL 62; LDL Chol Calc (NIH) 163; Triglycerides 59   CrCl cannot be calculated (Patient's most recent lab result is older than the maximum 21 days allowed.).   Wt Readings from Last 3 Encounters:  11/27/20 203 lb (92.1 kg)  10/17/20 204 lb (92.5 kg)   06/08/20 204 lb (92.5 kg)     Other studies reviewed: Additional studies/records reviewed today include: summarized above  ASSESSMENT AND PLAN:  1. SVT     AVNRT s/p ablation April 2021  No symptoms, off BB   2. HTN     Pretty good, discussed reduction in Na+ and continued exercise     Disposition: 1 year in clinic or virtual, sooner if needed   Time:   Today, I have spent 11 minutes with the patient with telehealth technology discussing the above problems.   Current medicines are reviewed at length with the patient today.  The patient did not have any concerns regarding medicines.  Jacquelin, Kollmar, PA-C 11/27/2020 9:20 AM     [facility] [address] [place] [address] [phone] (office)  [phone] (fax)

## 2020-11-27 ENCOUNTER — Other Ambulatory Visit: Payer: Self-pay

## 2020-11-27 ENCOUNTER — Encounter: Payer: Self-pay | Admitting: Physician Assistant

## 2020-11-27 ENCOUNTER — Telehealth (INDEPENDENT_AMBULATORY_CARE_PROVIDER_SITE_OTHER): Payer: BC Managed Care – PPO | Admitting: Physician Assistant

## 2020-11-27 VITALS — BP 127/87 | HR 76 | Ht 62.0 in | Wt 203.0 lb

## 2020-11-27 DIAGNOSIS — I471 Supraventricular tachycardia: Secondary | ICD-10-CM

## 2020-11-27 DIAGNOSIS — I10 Essential (primary) hypertension: Secondary | ICD-10-CM | POA: Diagnosis not present

## 2021-01-16 ENCOUNTER — Telehealth: Payer: Self-pay

## 2021-01-16 ENCOUNTER — Other Ambulatory Visit: Payer: Self-pay | Admitting: Family Medicine

## 2021-01-16 MED ORDER — ALPRAZOLAM 0.25 MG PO TABS: ORAL_TABLET | ORAL | 1 refills | Status: DC

## 2021-01-16 NOTE — Telephone Encounter (Signed)
[facility] is requesting to fill pt eszopiclone. Please advise [facility]

## 2021-01-16 NOTE — Telephone Encounter (Signed)
Received fax from [facility] for a refill on the pts. Alprazolam pt. Last apt was 10/17/20 and next apt is 06/14/21.

## 2021-01-17 DIAGNOSIS — H2513 Age-related nuclear cataract, bilateral: Secondary | ICD-10-CM | POA: Diagnosis not present

## 2021-02-05 ENCOUNTER — Telehealth: Payer: Self-pay

## 2021-02-05 MED ORDER — ALPRAZOLAM 0.25 MG PO TABS: ORAL_TABLET | ORAL | 1 refills | Status: DC

## 2021-02-05 NOTE — Telephone Encounter (Signed)
Received fax from [facility] for a refill on the pts. Alprazolam pt. Last apt was 10/17/20 and next apt 06/14/21

## 2021-02-20 DIAGNOSIS — H18413 Arcus senilis, bilateral: Secondary | ICD-10-CM | POA: Diagnosis not present

## 2021-02-20 DIAGNOSIS — H25043 Posterior subcapsular polar age-related cataract, bilateral: Secondary | ICD-10-CM | POA: Diagnosis not present

## 2021-02-20 DIAGNOSIS — H2513 Age-related nuclear cataract, bilateral: Secondary | ICD-10-CM | POA: Diagnosis not present

## 2021-02-20 DIAGNOSIS — H25013 Cortical age-related cataract, bilateral: Secondary | ICD-10-CM | POA: Diagnosis not present

## 2021-02-20 DIAGNOSIS — H2512 Age-related nuclear cataract, left eye: Secondary | ICD-10-CM | POA: Diagnosis not present

## 2021-02-21 ENCOUNTER — Ambulatory Visit (INDEPENDENT_AMBULATORY_CARE_PROVIDER_SITE_OTHER): Payer: BC Managed Care – PPO

## 2021-02-21 ENCOUNTER — Other Ambulatory Visit: Payer: Self-pay

## 2021-02-21 DIAGNOSIS — M170 Bilateral primary osteoarthritis of knee: Secondary | ICD-10-CM | POA: Diagnosis not present

## 2021-02-21 DIAGNOSIS — Z23 Encounter for immunization: Secondary | ICD-10-CM

## 2021-03-23 DIAGNOSIS — H2512 Age-related nuclear cataract, left eye: Secondary | ICD-10-CM | POA: Diagnosis not present

## 2021-03-23 DIAGNOSIS — H2511 Age-related nuclear cataract, right eye: Secondary | ICD-10-CM | POA: Diagnosis not present

## 2021-04-06 DIAGNOSIS — H2511 Age-related nuclear cataract, right eye: Secondary | ICD-10-CM | POA: Diagnosis not present

## 2021-04-21 ENCOUNTER — Other Ambulatory Visit: Payer: Self-pay | Admitting: Family Medicine

## 2021-04-21 DIAGNOSIS — I700 Atherosclerosis of aorta: Secondary | ICD-10-CM

## 2021-04-21 DIAGNOSIS — E785 Hyperlipidemia, unspecified: Secondary | ICD-10-CM

## 2021-04-21 DIAGNOSIS — E039 Hypothyroidism, unspecified: Secondary | ICD-10-CM

## 2021-04-25 ENCOUNTER — Other Ambulatory Visit: Payer: Self-pay | Admitting: Family Medicine

## 2021-04-25 DIAGNOSIS — E039 Hypothyroidism, unspecified: Secondary | ICD-10-CM

## 2021-04-25 NOTE — Telephone Encounter (Signed)
Pt has an appointment end of july

## 2021-04-30 DIAGNOSIS — Z1231 Encounter for screening mammogram for malignant neoplasm of breast: Secondary | ICD-10-CM | POA: Diagnosis not present

## 2021-04-30 LAB — HM MAMMOGRAPHY

## 2021-05-09 IMAGING — DX DG CHEST 1V PORT
1 series · 1 of 1 positions shown · non-contrast
Comparison: None.

CLINICAL DATA: Tachycardia, dizziness

EXAM:
PORTABLE CHEST 1 VIEW

[chest ap]
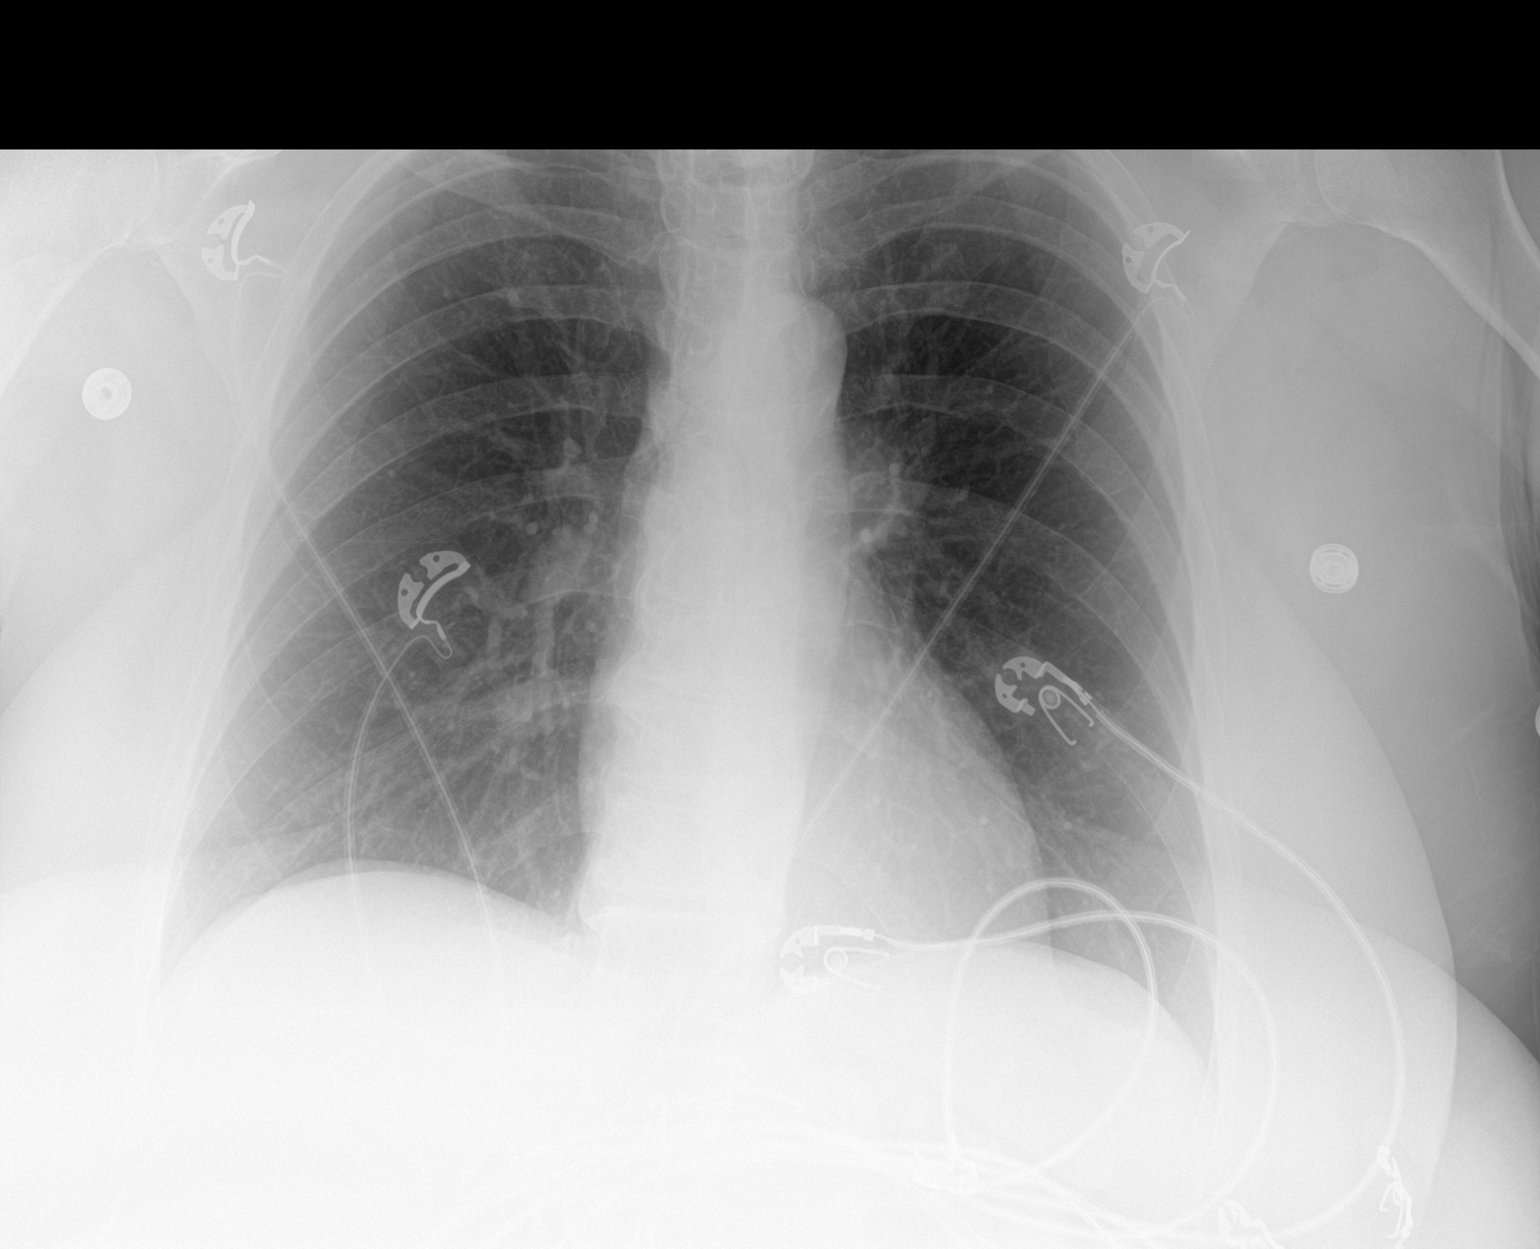

[1 of 1 positions shown; findings below may reference images not displayed]

FINDINGS: No consolidation, features of edema, pneumothorax, or effusion.
Pulmonary vascularity is normally distributed. The aorta is
calcified. The remaining cardiomediastinal contours are
unremarkable. No acute osseous or soft tissue abnormality.
IMPRESSION: No acute cardiopulmonary abnormality.

Aortic Atherosclerosis ([id number]).

## 2021-05-15 ENCOUNTER — Encounter: Payer: Self-pay | Admitting: Family Medicine

## 2021-06-14 ENCOUNTER — Ambulatory Visit: Payer: BC Managed Care – PPO | Admitting: Family Medicine

## 2021-06-14 ENCOUNTER — Other Ambulatory Visit: Payer: Self-pay

## 2021-06-14 ENCOUNTER — Encounter: Payer: Self-pay | Admitting: Family Medicine

## 2021-06-14 VITALS — BP 128/82 | HR 71 | Temp 97.2°F | Ht 61.5 in | Wt 214.4 lb

## 2021-06-14 DIAGNOSIS — M1990 Unspecified osteoarthritis, unspecified site: Secondary | ICD-10-CM

## 2021-06-14 DIAGNOSIS — E785 Hyperlipidemia, unspecified: Secondary | ICD-10-CM

## 2021-06-14 DIAGNOSIS — I700 Atherosclerosis of aorta: Secondary | ICD-10-CM | POA: Diagnosis not present

## 2021-06-14 DIAGNOSIS — E669 Obesity, unspecified: Secondary | ICD-10-CM

## 2021-06-14 DIAGNOSIS — Z86010 Personal history of colonic polyps: Secondary | ICD-10-CM

## 2021-06-14 DIAGNOSIS — E039 Hypothyroidism, unspecified: Secondary | ICD-10-CM | POA: Diagnosis not present

## 2021-06-14 DIAGNOSIS — I471 Supraventricular tachycardia: Secondary | ICD-10-CM

## 2021-06-14 DIAGNOSIS — Z6838 Body mass index (BMI) 38.0-38.9, adult: Secondary | ICD-10-CM

## 2021-06-14 DIAGNOSIS — I10 Essential (primary) hypertension: Secondary | ICD-10-CM | POA: Diagnosis not present

## 2021-06-14 DIAGNOSIS — Z0000 Encounter for general adult medical examination without abnormal findings: Secondary | ICD-10-CM | POA: Diagnosis not present

## 2021-06-14 DIAGNOSIS — J309 Allergic rhinitis, unspecified: Secondary | ICD-10-CM

## 2021-06-14 DIAGNOSIS — F419 Anxiety disorder, unspecified: Secondary | ICD-10-CM

## 2021-06-14 NOTE — Progress Notes (Signed)
   Subjective:    Patient ID: Paula Cline, female    DOB: 10-26-1956, 65 y.o.   MRN: [id number]  HPI She is here for a complete examination.  She is about to go on Medicare and would like to hold off on any medications until she is covered.  She continues on her thyroid medication and is having no difficulty with that.  She is taking lisinopril/HCTZ and having no difficulty with that.  Her weight is unfortunately unchanged.  Allergies seem to be under good control.  She is in the process of being treated for her bilateral knee arthritis and will hopefully be able to get Synvisc injections after she goes on Medicare.  She continues on atorvastatin for her hyperlipidemia and aortic atherosclerosis.  She does occasionally use Xanax for anxiety but not on a regular basis.  She relates that this is usually revolves around dealing with her half-brother.  She does have a previous history of SVT and has had an ablation which has worked.  Does have a history of colonic polyps and is scheduled for follow-up in 2023.  Pap and mammogram data was reviewed.  Otherwise her family and social history as well as health maintenance and immunizations was reviewed.  Review of Systems  All other systems reviewed and are negative.     Objective:   Physical Exam Alert and in no distress. Tympanic membranes and canals are normal. Pharyngeal area is normal. Neck is supple without adenopathy or thyromegaly. Cardiac exam shows a regular sinus rhythm without murmurs or gallops. Lungs are clear to auscultation. Abdominal exam shows no masses or tenderness.       Assessment & Plan:  Routine general medical examination at a health care facility  Hypothyroidism, unspecified type - Plan: TSH  Essential hypertension - Plan: CBC with Differential/Platelet, Comprehensive metabolic panel  Class 2 obesity without serious comorbidity with body mass index (BMI) of 38.0 to 38.9 in adult, unspecified obesity type - Plan: CBC with  Differential/Platelet, Comprehensive metabolic panel, Lipid panel  Atherosclerosis of aorta (HCC) - Plan: Lipid panel  History of colonic polyps  SVT (supraventricular tachycardia) (HCC)  Anxiety  Hyperlipidemia with target LDL less than 100  Arthritis  Allergic rhinitis, mild Discussed the anxiety especially in regard to dealing with her half-brother.  Discussed various options including finding out what her legal obligations are concerning the property that they owned together.  She will continue on her present medications pending blood work.  Again encouraged her to make diet and exercise changes however the knee pain does limit her physical activity.  Discussed using Medicare to join Silver sneakers.

## 2021-06-15 ENCOUNTER — Other Ambulatory Visit: Payer: Self-pay

## 2021-06-15 ENCOUNTER — Encounter: Payer: Self-pay | Admitting: Family Medicine

## 2021-06-15 DIAGNOSIS — E039 Hypothyroidism, unspecified: Secondary | ICD-10-CM

## 2021-06-15 DIAGNOSIS — I700 Atherosclerosis of aorta: Secondary | ICD-10-CM

## 2021-06-15 DIAGNOSIS — I10 Essential (primary) hypertension: Secondary | ICD-10-CM

## 2021-06-15 DIAGNOSIS — E785 Hyperlipidemia, unspecified: Secondary | ICD-10-CM

## 2021-06-15 LAB — TSH: TSH: 0.377 u[IU]/mL — ABNORMAL LOW (ref 0.450–4.500)

## 2021-06-15 LAB — COMPREHENSIVE METABOLIC PANEL
ALT: 14 IU/L (ref 0–32)
AST: 22 IU/L (ref 0–40)
Albumin/Globulin Ratio: 2 (ref 1.2–2.2)
Albumin: 4.3 g/dL (ref 3.8–4.8)
Alkaline Phosphatase: 83 IU/L (ref 44–121)
BUN/Creatinine Ratio: 32 — ABNORMAL HIGH (ref 12–28)
BUN: 23 mg/dL (ref 8–27)
Bilirubin Total: 0.4 mg/dL (ref 0.0–1.2)
CO2: 22 mmol/L (ref 20–29)
Calcium: 9.6 mg/dL (ref 8.7–10.3)
Chloride: 101 mmol/L (ref 96–106)
Creatinine, Ser: 0.71 mg/dL (ref 0.57–1.00)
Globulin, Total: 2.2 g/dL (ref 1.5–4.5)
Glucose: 91 mg/dL (ref 65–99)
Potassium: 4.1 mmol/L (ref 3.5–5.2)
Sodium: 139 mmol/L (ref 134–144)
Total Protein: 6.5 g/dL (ref 6.0–8.5)
eGFR: 95 mL/min/{1.73_m2} (ref >59)

## 2021-06-15 LAB — LIPID PANEL
Chol/HDL Ratio: 2.6 ratio (ref 0.0–4.4)
Cholesterol, Total: 183 mg/dL (ref 100–199)
HDL: 70 mg/dL (ref >39)
LDL Chol Calc (NIH): 104 mg/dL — ABNORMAL HIGH (ref 0–99)
Triglycerides: 47 mg/dL (ref 0–149)
VLDL Cholesterol Cal: 9 mg/dL (ref 5–40)

## 2021-06-15 LAB — CBC WITH DIFFERENTIAL/PLATELET
Basophils Absolute: 0.1 10*3/uL (ref 0.0–0.2)
Basos: 1 %
EOS (ABSOLUTE): 0.2 10*3/uL (ref 0.0–0.4)
Eos: 2 %
Hematocrit: 40.9 % (ref 34.0–46.6)
Hemoglobin: 13.4 g/dL (ref 11.1–15.9)
Immature Grans (Abs): 0 10*3/uL (ref 0.0–0.1)
Immature Granulocytes: 0 %
Lymphocytes Absolute: 1.8 10*3/uL (ref 0.7–3.1)
Lymphs: 26 %
MCH: 29.5 pg (ref 26.6–33.0)
MCHC: 32.8 g/dL (ref 31.5–35.7)
MCV: 90 fL (ref 79–97)
Monocytes Absolute: 0.5 10*3/uL (ref 0.1–0.9)
Monocytes: 7 %
Neutrophils Absolute: 4.4 10*3/uL (ref 1.4–7.0)
Neutrophils: 64 %
Platelets: 242 10*3/uL (ref 150–450)
RBC: 4.55 x10E6/uL (ref 3.77–5.28)
RDW: 11.8 % (ref 11.7–15.4)
WBC: 6.8 10*3/uL (ref 3.4–10.8)

## 2021-06-15 MED ORDER — SYNTHROID 125 MCG PO TABS: 125.0000 ug | ORAL_TABLET | Freq: Every day | ORAL | 0 refills | Status: DC

## 2021-06-15 MED ORDER — ESZOPICLONE 3 MG PO TABS: 3.0000 mg | ORAL_TABLET | Freq: Every evening | ORAL | 1 refills | Status: DC | PRN

## 2021-06-15 MED ORDER — ATORVASTATIN CALCIUM 20 MG PO TABS: 20.0000 mg | ORAL_TABLET | Freq: Every day | ORAL | 0 refills | Status: DC

## 2021-06-15 MED ORDER — LISINOPRIL-HYDROCHLOROTHIAZIDE 20-12.5 MG PO TABS: 1.0000 | ORAL_TABLET | Freq: Every day | ORAL | 3 refills | Status: DC

## 2021-06-15 MED ORDER — ALPRAZOLAM 0.25 MG PO TABS: ORAL_TABLET | ORAL | 1 refills | Status: DC

## 2021-06-15 MED ORDER — ATORVASTATIN CALCIUM 20 MG PO TABS: 20.0000 mg | ORAL_TABLET | Freq: Every day | ORAL | 3 refills | Status: DC

## 2021-06-15 MED ORDER — SYNTHROID 125 MCG PO TABS: 125.0000 ug | ORAL_TABLET | Freq: Every day | ORAL | 3 refills | Status: DC

## 2021-06-15 NOTE — Progress Notes (Signed)
Pt has seen on my chart. Kh

## 2021-06-15 NOTE — Addendum Note (Signed)
Addended by: Denita Lung on: 06/15/2021 01:21 PM   Modules accepted: Orders

## 2021-06-19 ENCOUNTER — Telehealth: Payer: Self-pay | Admitting: Family Medicine

## 2021-06-19 NOTE — Telephone Encounter (Signed)
Clarise Cruz with [facility] wants to know if it is ok to give the patient ABBVIE MANAUFACTURED Synthroid 125 mcg. Patient prefers ABBVIE and it is a law that they must receive authorization to do that. Clarise Cruz [phone]  Ref [id number]

## 2021-06-20 NOTE — Telephone Encounter (Signed)
Called [facility] back spoke with Lattie Haw, gave her authorization.

## 2021-07-04 ENCOUNTER — Telehealth: Payer: Self-pay | Admitting: Family Medicine

## 2021-07-04 ENCOUNTER — Other Ambulatory Visit: Payer: Self-pay

## 2021-07-04 DIAGNOSIS — E2839 Other primary ovarian failure: Secondary | ICD-10-CM

## 2021-07-04 NOTE — Telephone Encounter (Signed)
Done Kh 

## 2021-07-04 NOTE — Telephone Encounter (Signed)
Pt called and said she was trying to set up her bone density but they told her she needed and order and they will call her to schedule.

## 2021-07-04 NOTE — Telephone Encounter (Signed)
Spoke to pt and just needs order for dexa scan. kH

## 2021-07-05 DIAGNOSIS — M170 Bilateral primary osteoarthritis of knee: Secondary | ICD-10-CM | POA: Diagnosis not present

## 2021-07-12 DIAGNOSIS — M13861 Other specified arthritis, right knee: Secondary | ICD-10-CM | POA: Diagnosis not present

## 2021-07-12 DIAGNOSIS — M13862 Other specified arthritis, left knee: Secondary | ICD-10-CM | POA: Diagnosis not present

## 2021-07-16 ENCOUNTER — Other Ambulatory Visit: Payer: Self-pay

## 2021-07-16 DIAGNOSIS — Z13820 Encounter for screening for osteoporosis: Secondary | ICD-10-CM

## 2021-07-19 DIAGNOSIS — M13862 Other specified arthritis, left knee: Secondary | ICD-10-CM | POA: Diagnosis not present

## 2021-07-19 DIAGNOSIS — M13861 Other specified arthritis, right knee: Secondary | ICD-10-CM | POA: Diagnosis not present

## 2021-07-24 ENCOUNTER — Other Ambulatory Visit: Payer: Self-pay | Admitting: Family Medicine

## 2021-07-24 DIAGNOSIS — I10 Essential (primary) hypertension: Secondary | ICD-10-CM

## 2021-08-06 ENCOUNTER — Telehealth: Payer: Self-pay

## 2021-08-06 NOTE — Telephone Encounter (Signed)
Order resent. [place]

## 2021-08-06 NOTE — Telephone Encounter (Signed)
Pt. Called stating that she is scheduled for DEXA scan on 08/20/21. The original order was put in at [facility] and it was supposed to be put in to [facility]. I put in an order to [facility] on 07/16/21 and they are still telling her they do not have an order for a DEXA. If you could check on that for her and call her and let us know they have the order.

## 2021-08-09 ENCOUNTER — Other Ambulatory Visit: Payer: Self-pay | Admitting: Family Medicine

## 2021-08-09 DIAGNOSIS — E039 Hypothyroidism, unspecified: Secondary | ICD-10-CM

## 2021-08-09 DIAGNOSIS — I700 Atherosclerosis of aorta: Secondary | ICD-10-CM

## 2021-08-09 DIAGNOSIS — E785 Hyperlipidemia, unspecified: Secondary | ICD-10-CM

## 2021-08-20 DIAGNOSIS — Z780 Asymptomatic menopausal state: Secondary | ICD-10-CM | POA: Diagnosis not present

## 2021-08-20 DIAGNOSIS — M8589 Other specified disorders of bone density and structure, multiple sites: Secondary | ICD-10-CM | POA: Diagnosis not present

## 2021-08-20 LAB — HM DEXA SCAN

## 2021-08-21 ENCOUNTER — Ambulatory Visit (INDEPENDENT_AMBULATORY_CARE_PROVIDER_SITE_OTHER): Payer: PPO | Admitting: Family Medicine

## 2021-08-21 ENCOUNTER — Other Ambulatory Visit: Payer: Self-pay

## 2021-08-21 VITALS — BP 130/82 | HR 74 | Temp 97.7°F | Wt 218.0 lb

## 2021-08-21 DIAGNOSIS — H0015 Chalazion left lower eyelid: Secondary | ICD-10-CM

## 2021-08-21 DIAGNOSIS — Z23 Encounter for immunization: Secondary | ICD-10-CM

## 2021-08-21 MED ORDER — ERYTHROMYCIN 5 MG/GM OP OINT: 1.0000 "application " | TOPICAL_OINTMENT | Freq: Three times a day (TID) | OPHTHALMIC | 0 refills | Status: DC

## 2021-08-21 NOTE — Progress Notes (Signed)
   Subjective:    Patient ID: Paula Cline, female    DOB: [birth year], 65 y.o.   MRN: [id number]  HPI She has a several day history of difficulty with swelling of the lower left eyelid and a feeling of a lump in it.   Review of Systems     Objective:   Physical Exam Exam of the left conjunctive is normal.  Left lower lid is slightly erythematous and a lesion was noted on the mucosal surface.       Assessment & Plan:  Chalazion of left lower eyelid - Plan: erythromycin ophthalmic ointment  Need for COVID-19 vaccine - Plan: Moderna Covid-19 Vaccine Bivalent Booster Warm washcloth 3 times per day.  Use ointment on it 3 times per day She will call me if continued difficulty.

## 2021-08-21 NOTE — Patient Instructions (Signed)
Warm washcloth 3 times per day.  Use ointment on it 3 times per day

## 2021-08-22 ENCOUNTER — Ambulatory Visit: Payer: BC Managed Care – PPO

## 2021-08-22 NOTE — Progress Notes (Signed)
Pt was advised to take vitamin d and calcium Per Dr. Redmond School. Paula Cline

## 2021-09-04 DIAGNOSIS — M13862 Other specified arthritis, left knee: Secondary | ICD-10-CM | POA: Diagnosis not present

## 2021-09-04 DIAGNOSIS — M13861 Other specified arthritis, right knee: Secondary | ICD-10-CM | POA: Diagnosis not present

## 2021-10-24 DIAGNOSIS — M13861 Other specified arthritis, right knee: Secondary | ICD-10-CM | POA: Diagnosis not present

## 2021-10-24 DIAGNOSIS — M13862 Other specified arthritis, left knee: Secondary | ICD-10-CM | POA: Diagnosis not present

## 2021-11-07 ENCOUNTER — Other Ambulatory Visit: Payer: Self-pay | Admitting: Family Medicine

## 2021-11-07 NOTE — Telephone Encounter (Signed)
[facility] is requesting to fill pt eszopiclone . Please advise Kh

## 2021-11-25 NOTE — Progress Notes (Signed)
Cardiology Office Note Date:  11/25/2021  Patient ID:  Paula Cline Jul 31, 1956, MRN [id number] PCP:  Paula Lung, MD  EP:  Dr. Curt Cline     Chief Complaint:   annual visit  History of Present Illness: Paula Cline is a 66 y.o. female with history of HTN, HLD, former smoker, hypothyroidism, obesity, SVT.  She comes in today to be seen for Dr. Curt Cline, last seen by him 04/18/20, she was s/p SVT ablation (03/01/20), doing well though somewhat fatigued and her metoprolol was stopped planned to f/u PRN  Patient reached with increased palpitations off her metoprolol.   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  She has done this before without particular symptoms, she does say that it was very hot out though.  She did not stop all together, but took breaks.  Even once home, felt like her HR took a long time to settle down.  It did not feel like her SVT, but was very worried it was going to startup. This was very anxiety provoking for her and since then has been quite worried, often checking her pulse feeling some missing beats time to time, that have her worried as well.   She now thinks that her anxiety may be getting the better of her and is worried that she may not be able to tell if she is having anxiety or real symptoms.  No near syncope or syncope.  She thinks she has noted that her exertional capacity.  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  She has started back a little bit and feels like she fatigues easier.  Shew was resumed on low dose lopressor  with perhaps some degree of reflex tachycardia, also noted deconditioned state, obesity all may play a role as well as her anxiety, encouraged to exercise to her capacity with  a known bad knee, discuss her anxiety with her PMD, and we would see her back in 6 weeks.  I saw her 06/06/20 She feels GREAT!  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  She was back on the metoprolol 125mg  BID though forgot Sunday and yesterday and this Am did not take any and feels like she would like to stay off it. She has not had any palpitations.   No CP, SOB or DOE. She is very happy with how she is feeling/doing. She wanted to wean off BB and planned for this  I saw her via telehealth visit 71/10/22 She is doing quite well. Walking 1/2 hour on the treadmill every morning at about 3.57mph pace. NO CP, palpitations feels like she has good exertional capacity. No dizzy spells, near syncope or syncope. When she is upset abut something or really bothered she will since with her anxiety that her HR tends to be higher, though no overt palpitations off the metoprolol. Her BPs generally 120's/80's, admits that she has been a bit liberal with sodium. No changes were made, planned for annual visit  TODAY She is doing very well. Had some b/l knee trouble last year with  escalating knee pain her exercise regime suffered and she gained weight, was depressing and skowed her for a while though finally her insurance company covered the treatments/injections, and has had good relief and back on the treadmill regularly, actively wirking on weight loss She will occasionally after meals feel her HR elevated Otherwise no cardiac awareness, no SVTs, no SOB, no near syncope or syncope. PMD does labs, follows/manages her lipids   Past Medical History:  Diagnosis Date   Allergy    RHINITIS   Arthritis    Diverticulitis    Former smoker    Hypercholesteremia    Hypertension    Obesity    SVT (supraventricular tachycardia) ([place])  02/2020   SVT (supraventricular tachycardia) ([place]) 10/27/2019   Thyroid disease     Past Surgical History:  Procedure Laterality Date   COLONOSCOPY  2007/2008   Dr. Ardis Hughs   KNEE ARTHROSCOPY  2012   Right knee   SVT ABLATION N/A 03/01/2020   Procedure: SVT ABLATION;  Surgeon: Paula Haw, MD;  Location: [facility];  Service: Cardiovascular;  Laterality: N/A;   WISDOM TOOTH EXTRACTION      Current Outpatient Medications  Medication Sig Dispense Refill   ALPRAZolam (XANAX) 0.25 MG tablet TAKE ONE TABLET BY MOUTH DAILY AS NEEDED FOR ANXIETY 10 tablet 1   atorvastatin (LIPITOR) 20 MG tablet Take 1 tablet by mouth daily 90 tablet 0   calcium carbonate (OSCAL) 1500 (600 Ca) MG TABS tablet Take 600 mg of elemental calcium by mouth daily. (Patient not taking: Reported on 06/14/2021)     Carboxymethylcellul-Glycerin (LUBRICATING EYE DROPS OP) Place 1 drop into both eyes daily as needed (dry eyes).     Cholecalciferol (VITAMIN D) 50 MCG (2000 UT) tablet Take 2,000 Units by mouth daily.     diclofenac sodium (VOLTAREN) 1 % GEL Apply 1 application topically 2 (two) times daily as needed (knee pain).      erythromycin ophthalmic ointment Place 1 application into the left eye 3 (three) times daily. 3.5 g 0   Eszopiclone 3 MG TABS TAKE ONE TABLET BY MOUTH AT BEDTIME AS NEEDED **TAKE IMMEDIATELY BEFORE BEDTIME** 30 tablet 1   fluticasone (FLONASE) 50 MCG/ACT nasal spray Place 1 spray into both nostrils daily as needed for allergies or rhinitis.     Glucosamine-Chondroit-Vit C-Mn (GLUCOSAMINE CHONDR 1500 COMPLX) CAPS Take 1 capsule by mouth daily.     lisinopril-hydrochlorothiazide (ZESTORETIC) 20-12.5 MG tablet TAKE ONE TABLET BY MOUTH DAILY 90 tablet 1   loratadine (CLARITIN) 10 MG tablet Take 10 mg by mouth daily.     Magnesium 400 MG CAPS Take 400 mg by mouth at bedtime as needed (muscle relaxation).     Multiple Vitamins-Minerals (WOMENS 50+ MULTI VITAMIN/MIN) TABS Take 1 tablet by  mouth daily.      Potassium 99 MG TABS Take 99 mg by mouth at bedtime as needed (muscle relaxation).     SYNTHROID 125 MCG tablet Take 1 tablet by mouth daily 90 tablet 0   TURMERIC CURCUMIN PO Take 1 capsule by mouth daily. (Patient not taking: No sig reported)     valACYclovir (VALTREX) 1000 MG tablet TAKE TWO TABLETS BY MOUTH TWICE A DAY FOR 1 DAY , THEN AS DIRECTED BY YOUR DOCTOR 12 tablet 4   No current facility-administered medications for this visit.    Allergies:   Codeine and Hydrocodone   Social History:  The patient  reports that she quit smoking about 28 years ago. Her smoking  use included cigarettes. She has never used smokeless tobacco. She reports current alcohol use. She reports that she does not use drugs.   Family History:  The patient's family history includes Alzheimer's disease in her father; CAD in her paternal grandfather; Cancer (age of onset: 86) in her mother; Cancer (age of onset: 39) in her father; Hypertension in her father.  ROS:  Please see the history of present illness.    All other systems are reviewed and otherwise negative.   PHYSICAL EXAM:  VS:  LMP 11/18/2010  BMI: There is no height or weight on file to calculate BMI. Well nourished, well developed, in no acute distress  HEENT: normocephalic, atraumatic  Neck: no JVD, carotid bruits or masses Cardiac:   RRR; no significant murmurs, no rubs, or gallops Lungs:  CTA b/l, no wheezing, rhonchi or rales  Abd: soft, nontender, obese MS: no deformity or atrophy Ext:  no edema  Skin: warm and dry, no rash Neuro:  No gross deficits appreciated Psych: euthymic mood, full affect   EKG:  done today and reviewed by myself SR 77bpm, no changes    03/01/2020: EPS/Ablation CONCLUSIONS:  1. Sinus rhythm upon presentation.  2. The patient had dual AV nodal physiology with easily inducible classic AV nodal reentrant tachycardia, there were no other accessory pathways or arrhythmias induced  3. Successful  radiofrequency modification of the slow AV nodal pathway  4. No inducible arrhythmias following ablation.  5. No early apparent complications.    TTE 10/27/19  Review of the above records today demonstrates:   1. Left ventricular ejection fraction, by visual estimation, is 60 to 65%. The left ventricle has normal function. There is no left ventricular hypertrophy.  2. The left ventricle has no regional wall motion abnormalities.  3. Global right ventricle has normal systolic function.The right ventricular size is normal. No increase in right ventricular wall thickness.  4. Left atrial size was normal.  5. Right atrial size was normal.  6. Presence of pericardial fat pad.  7. Trivial pericardial effusion is present.  8. The mitral valve is grossly normal. Trivial mitral valve regurgitation.  9. The tricuspid valve is grossly normal. Tricuspid valve regurgitation is trivial. 10. The aortic valve is tricuspid. Aortic valve regurgitation is not visualized. No evidence of aortic valve sclerosis or stenosis. 11. The pulmonic valve was grossly normal. Pulmonic valve regurgitation is trivial. 12. Normal pulmonary artery systolic pressure. 13. The tricuspid regurgitant velocity is 2.28 m/s, and with an assumed right atrial pressure of 3 mmHg, the estimated right ventricular systolic pressure is normal at 23.7 mmHg. 14. The inferior vena cava is normal in size with greater than 50% respiratory variability, suggesting right atrial pressure of 3 mmHg. 15. No prior Echocardiogram.  Recent Labs: 06/14/2021: ALT 14; BUN 23; Creatinine, Ser 0.71; Hemoglobin 13.4; Platelets 242; Potassium 4.1; Sodium 139; TSH 0.377  06/14/2021: Chol/HDL Ratio 2.6; Cholesterol, Total 183; HDL 70; LDL Chol Calc (NIH) 104; Triglycerides 47   CrCl cannot be calculated (Patient's most recent lab result is older than the maximum 21 days allowed.).   Wt Readings from Last 3 Encounters:  08/21/21 218 lb (98.9 kg)  06/14/21 214 lb  6.4 oz (97.3 kg)  11/27/20 203 lb (92.1 kg)     Other studies reviewed: Additional studies/records reviewed today include: summarized above  ASSESSMENT AND PLAN:  1. SVT     AVNRT s/p ablation April 2021     No symptoms   2. HTN  Looks OK    Disposition: no changes, we can continue to see her annually or PRN, she would like to continue to check in annually    Current medicines are reviewed at length with the patient today.  The patient did not have any concerns regarding medicines.  Paula Cline, [place], PA-C 11/25/2021 12:26 PM     [place] [address] [phone] (office)  [phone] (fax)

## 2021-11-28 ENCOUNTER — Encounter: Payer: Self-pay | Admitting: Physician Assistant

## 2021-11-28 ENCOUNTER — Ambulatory Visit: Payer: PPO | Admitting: Physician Assistant

## 2021-11-28 ENCOUNTER — Other Ambulatory Visit: Payer: Self-pay

## 2021-11-28 VITALS — BP 116/72 | HR 77 | Ht 62.0 in | Wt 218.4 lb

## 2021-11-28 DIAGNOSIS — E039 Hypothyroidism, unspecified: Secondary | ICD-10-CM

## 2021-11-28 DIAGNOSIS — I700 Atherosclerosis of aorta: Secondary | ICD-10-CM

## 2021-11-28 DIAGNOSIS — I10 Essential (primary) hypertension: Secondary | ICD-10-CM | POA: Diagnosis not present

## 2021-11-28 DIAGNOSIS — I471 Supraventricular tachycardia: Secondary | ICD-10-CM

## 2021-11-28 DIAGNOSIS — E785 Hyperlipidemia, unspecified: Secondary | ICD-10-CM

## 2021-11-28 MED ORDER — SYNTHROID 125 MCG PO TABS: 125.0000 ug | ORAL_TABLET | Freq: Every day | ORAL | 1 refills | Status: DC

## 2021-11-28 MED ORDER — ATORVASTATIN CALCIUM 20 MG PO TABS: 20.0000 mg | ORAL_TABLET | Freq: Every day | ORAL | 1 refills | Status: DC

## 2021-11-28 NOTE — Patient Instructions (Signed)
Medication Instructions:    Your physician recommends that you continue on your current medications as directed. Please refer to the Current Medication list given to you today.   *If you need a refill on your cardiac medications before your next appointment, please call your pharmacy*   Lab Work: [place]   If you have labs (blood work) drawn today and your tests are completely normal, you will receive your results only by: [place] (if you have MyChart) OR A paper copy in the mail If you have any lab test that is abnormal or we need to change your treatment, we will call you to review the results.   Testing/Procedures: NONE ORDERED  TODAY   Follow-Up: At [facility], you and your health needs are our priority.  As part of our continuing mission to provide you with exceptional heart care, we have created designated Provider Care Teams.  These Care Teams include your primary Cardiologist (physician) and Advanced Practice Providers (APPs -  Physician Assistants and Nurse Practitioners) who all work together to provide you with the care you need, when you need it.  We recommend signing up for the patient portal called "MyChart".  Sign up information is provided on this After Visit Summary.  MyChart is used to connect with patients for Virtual Visits (Telemedicine).  Patients are able to view lab/test results, encounter notes, upcoming appointments, etc.  Non-urgent messages can be sent to your provider as well.   To learn more about what you can do with MyChart, go to [email or web address].    Your next appointment:    CONTACT [facility] [phone] AS NEEDED FOR  ANY CARDIAC RELATED SYMPTOMS }    Other Instructions

## 2021-12-14 DIAGNOSIS — M79671 Pain in right foot: Secondary | ICD-10-CM | POA: Diagnosis not present

## 2021-12-14 DIAGNOSIS — M25571 Pain in right ankle and joints of right foot: Secondary | ICD-10-CM | POA: Diagnosis not present

## 2021-12-14 DIAGNOSIS — M19079 Primary osteoarthritis, unspecified ankle and foot: Secondary | ICD-10-CM | POA: Diagnosis not present

## 2021-12-27 DIAGNOSIS — M19071 Primary osteoarthritis, right ankle and foot: Secondary | ICD-10-CM | POA: Diagnosis not present

## 2022-01-16 ENCOUNTER — Other Ambulatory Visit: Payer: Self-pay

## 2022-01-16 ENCOUNTER — Ambulatory Visit: Payer: PPO

## 2022-01-21 ENCOUNTER — Encounter: Payer: Self-pay | Admitting: Gastroenterology

## 2022-01-30 ENCOUNTER — Other Ambulatory Visit: Payer: Self-pay | Admitting: Family Medicine

## 2022-01-30 NOTE — Telephone Encounter (Signed)
[facility] is requesting to fill pt xanax. Please advise [facility] 

## 2022-02-11 ENCOUNTER — Ambulatory Visit (INDEPENDENT_AMBULATORY_CARE_PROVIDER_SITE_OTHER): Payer: PPO | Admitting: Physician Assistant

## 2022-02-11 ENCOUNTER — Encounter: Payer: Self-pay | Admitting: Physician Assistant

## 2022-02-11 ENCOUNTER — Other Ambulatory Visit: Payer: Self-pay

## 2022-02-11 VITALS — Temp 99.0°F | Wt 218.0 lb

## 2022-02-11 DIAGNOSIS — J309 Allergic rhinitis, unspecified: Secondary | ICD-10-CM

## 2022-02-11 DIAGNOSIS — J0140 Acute pansinusitis, unspecified: Secondary | ICD-10-CM

## 2022-02-11 MED ORDER — SULFAMETHOXAZOLE-TRIMETHOPRIM 800-160 MG PO TABS: 1.0000 | ORAL_TABLET | Freq: Two times a day (BID) | ORAL | 0 refills | Status: DC

## 2022-02-11 NOTE — Progress Notes (Signed)
Start time: 11:30 am ?End time: 11:45 am ? ?Virtual Visit via Video Note ? ? Patient ID: Paula Cline, female    DOB: 08-27-1956, 66 y.o.   MRN: [id number] ? ?I connected with above patient on 02/11/22 by a video enabled telemedicine application and verified that I am speaking with the correct person using two identifiers. ? ?Location: ?Patient: home ?Provider: office ?  ?I discussed the limitations of evaluation and management by telemedicine and the availability of in person appointments. The patient expressed understanding and agreed to proceed. ? ?History of Present Illness: ? ?Chief Complaint  ?Patient presents with  ? Cough  ?  Phone only- coughing that started last week, headache and sinus pressure and low grade fever  ? ?Reports a 3 day history of not feeling well, Saturday morning woke up with laryngitis and a slight tenderness in throat; runny nose was thought to be ffrom pollen; yesterday temperature was 99 degrees and she was concerned bout having a "fever" so she called the staff from the study she is in and was advised to call this office for further evaluation; is coughing up clear to yellow phlegm and has post nasal drip that is worse; headache around sinuses that Tylenol helps; travelled, flew to [place] last week and states she wore a mask on the plane; home COVID test was negative; no known sick contacts;  denies chills / nausea / vomiting / diarrhea / constipation. ? ?  ?Observations/Objective: ? ?Temp 99 ?F (37.2 ?C)   Wt 218 lb (98.9 kg)   LMP 11/18/2010   BMI 39.87 kg/m?  ? ? ?Assessment: ?Encounter Diagnoses  ?Name Primary?  ? Acute non-recurrent pansinusitis Yes  ? Allergic rhinitis, mild   ? ? ? ?Plan: ?Rest, increase clear fluids, OTC Tylenol (acetamenophen) for body aches, headaches, fever, chills as needed.  ? ?Continue OTC Claritin and Tylenol as needed; septra ds bid x 10 days for sinusitis; If your symptoms get worse, please go to Urgent Care or go to the Emergency Department for  further evaluation in person.  ? ? ?Paula Cline was seen today for cough. ? ?Diagnoses and all orders for this visit: ? ?Acute non-recurrent pansinusitis ? ?Allergic rhinitis, mild ? ?Other orders ?-     sulfamethoxazole-trimethoprim (BACTRIM DS) 800-160 MG tablet; Take 1 tablet by mouth 2 (two) times daily. ? ? ? ?Follow up: as already scheduled with Dr. Redmond School ? ? ?I discussed the assessment and treatment plan with the patient. The patient was provided an opportunity to ask questions and all were answered. The patient agreed with the plan and demonstrated an understanding of the instructions. ?  ?The patient was advised to call back or seek an in-person evaluation if the symptoms worsen or if the condition fails to improve as anticipated. ? ?I spent 15 minutes dedicated to the care of this patient, including pre-visit review of records, face to face time, post-visit ordering of testing and documentation. ? ? ? ?Irene Pap, PA-C ?

## 2022-02-22 DIAGNOSIS — M170 Bilateral primary osteoarthritis of knee: Secondary | ICD-10-CM | POA: Diagnosis not present

## 2022-03-26 DIAGNOSIS — M170 Bilateral primary osteoarthritis of knee: Secondary | ICD-10-CM | POA: Diagnosis not present

## 2022-04-02 DIAGNOSIS — M170 Bilateral primary osteoarthritis of knee: Secondary | ICD-10-CM | POA: Diagnosis not present

## 2022-04-05 DIAGNOSIS — M25561 Pain in right knee: Secondary | ICD-10-CM | POA: Diagnosis not present

## 2022-04-05 DIAGNOSIS — M25562 Pain in left knee: Secondary | ICD-10-CM | POA: Diagnosis not present

## 2022-04-09 DIAGNOSIS — M170 Bilateral primary osteoarthritis of knee: Secondary | ICD-10-CM | POA: Diagnosis not present

## 2022-04-17 DIAGNOSIS — M25561 Pain in right knee: Secondary | ICD-10-CM | POA: Diagnosis not present

## 2022-04-17 DIAGNOSIS — M25562 Pain in left knee: Secondary | ICD-10-CM | POA: Diagnosis not present

## 2022-04-18 ENCOUNTER — Other Ambulatory Visit: Payer: Self-pay | Admitting: Family Medicine

## 2022-04-18 DIAGNOSIS — B001 Herpesviral vesicular dermatitis: Secondary | ICD-10-CM

## 2022-04-18 NOTE — Telephone Encounter (Signed)
Is this okay to refill? 

## 2022-04-19 DIAGNOSIS — M25562 Pain in left knee: Secondary | ICD-10-CM | POA: Diagnosis not present

## 2022-04-19 DIAGNOSIS — M25561 Pain in right knee: Secondary | ICD-10-CM | POA: Diagnosis not present

## 2022-04-24 DIAGNOSIS — M25562 Pain in left knee: Secondary | ICD-10-CM | POA: Diagnosis not present

## 2022-04-24 DIAGNOSIS — M25561 Pain in right knee: Secondary | ICD-10-CM | POA: Diagnosis not present

## 2022-04-26 DIAGNOSIS — M25562 Pain in left knee: Secondary | ICD-10-CM | POA: Diagnosis not present

## 2022-04-26 DIAGNOSIS — M25561 Pain in right knee: Secondary | ICD-10-CM | POA: Diagnosis not present

## 2022-05-01 DIAGNOSIS — M25562 Pain in left knee: Secondary | ICD-10-CM | POA: Diagnosis not present

## 2022-05-01 DIAGNOSIS — M25561 Pain in right knee: Secondary | ICD-10-CM | POA: Diagnosis not present

## 2022-05-01 DIAGNOSIS — H26493 Other secondary cataract, bilateral: Secondary | ICD-10-CM | POA: Diagnosis not present

## 2022-05-02 ENCOUNTER — Other Ambulatory Visit: Payer: Self-pay | Admitting: Family Medicine

## 2022-05-02 DIAGNOSIS — Z1231 Encounter for screening mammogram for malignant neoplasm of breast: Secondary | ICD-10-CM | POA: Diagnosis not present

## 2022-05-02 LAB — HM MAMMOGRAPHY

## 2022-05-03 DIAGNOSIS — M25562 Pain in left knee: Secondary | ICD-10-CM | POA: Diagnosis not present

## 2022-05-03 DIAGNOSIS — M25561 Pain in right knee: Secondary | ICD-10-CM | POA: Diagnosis not present

## 2022-05-03 NOTE — Telephone Encounter (Signed)
[facility] is requesting to fill pt eszopiclone. Please advise [facility]

## 2022-05-08 DIAGNOSIS — M25562 Pain in left knee: Secondary | ICD-10-CM | POA: Diagnosis not present

## 2022-05-08 DIAGNOSIS — M25561 Pain in right knee: Secondary | ICD-10-CM | POA: Diagnosis not present

## 2022-05-10 DIAGNOSIS — M25562 Pain in left knee: Secondary | ICD-10-CM | POA: Diagnosis not present

## 2022-05-10 DIAGNOSIS — M25561 Pain in right knee: Secondary | ICD-10-CM | POA: Diagnosis not present

## 2022-05-15 DIAGNOSIS — M25561 Pain in right knee: Secondary | ICD-10-CM | POA: Diagnosis not present

## 2022-05-15 DIAGNOSIS — M25562 Pain in left knee: Secondary | ICD-10-CM | POA: Diagnosis not present

## 2022-05-17 DIAGNOSIS — M25562 Pain in left knee: Secondary | ICD-10-CM | POA: Diagnosis not present

## 2022-05-17 DIAGNOSIS — M25561 Pain in right knee: Secondary | ICD-10-CM | POA: Diagnosis not present

## 2022-05-22 DIAGNOSIS — D1801 Hemangioma of skin and subcutaneous tissue: Secondary | ICD-10-CM | POA: Diagnosis not present

## 2022-05-22 DIAGNOSIS — L821 Other seborrheic keratosis: Secondary | ICD-10-CM | POA: Diagnosis not present

## 2022-05-22 DIAGNOSIS — M25562 Pain in left knee: Secondary | ICD-10-CM | POA: Diagnosis not present

## 2022-05-22 DIAGNOSIS — D229 Melanocytic nevi, unspecified: Secondary | ICD-10-CM | POA: Diagnosis not present

## 2022-05-22 DIAGNOSIS — L814 Other melanin hyperpigmentation: Secondary | ICD-10-CM | POA: Diagnosis not present

## 2022-05-22 DIAGNOSIS — L578 Other skin changes due to chronic exposure to nonionizing radiation: Secondary | ICD-10-CM | POA: Diagnosis not present

## 2022-05-22 DIAGNOSIS — D2271 Melanocytic nevi of right lower limb, including hip: Secondary | ICD-10-CM | POA: Diagnosis not present

## 2022-05-22 DIAGNOSIS — M25561 Pain in right knee: Secondary | ICD-10-CM | POA: Diagnosis not present

## 2022-05-22 DIAGNOSIS — D2272 Melanocytic nevi of left lower limb, including hip: Secondary | ICD-10-CM | POA: Diagnosis not present

## 2022-05-27 ENCOUNTER — Encounter: Payer: Self-pay | Admitting: Family Medicine

## 2022-05-29 DIAGNOSIS — M25562 Pain in left knee: Secondary | ICD-10-CM | POA: Diagnosis not present

## 2022-05-29 DIAGNOSIS — M25561 Pain in right knee: Secondary | ICD-10-CM | POA: Diagnosis not present

## 2022-05-31 DIAGNOSIS — M25562 Pain in left knee: Secondary | ICD-10-CM | POA: Diagnosis not present

## 2022-05-31 DIAGNOSIS — M25561 Pain in right knee: Secondary | ICD-10-CM | POA: Diagnosis not present

## 2022-06-06 DIAGNOSIS — M25561 Pain in right knee: Secondary | ICD-10-CM | POA: Diagnosis not present

## 2022-06-06 DIAGNOSIS — M25562 Pain in left knee: Secondary | ICD-10-CM | POA: Diagnosis not present

## 2022-06-12 DIAGNOSIS — M25562 Pain in left knee: Secondary | ICD-10-CM | POA: Diagnosis not present

## 2022-06-12 DIAGNOSIS — M25561 Pain in right knee: Secondary | ICD-10-CM | POA: Diagnosis not present

## 2022-06-14 DIAGNOSIS — M25562 Pain in left knee: Secondary | ICD-10-CM | POA: Diagnosis not present

## 2022-06-14 DIAGNOSIS — M25561 Pain in right knee: Secondary | ICD-10-CM | POA: Diagnosis not present

## 2022-06-17 ENCOUNTER — Ambulatory Visit: Payer: BC Managed Care – PPO | Admitting: Family Medicine

## 2022-06-19 DIAGNOSIS — M25561 Pain in right knee: Secondary | ICD-10-CM | POA: Diagnosis not present

## 2022-06-19 DIAGNOSIS — M25562 Pain in left knee: Secondary | ICD-10-CM | POA: Diagnosis not present

## 2022-06-21 DIAGNOSIS — M25562 Pain in left knee: Secondary | ICD-10-CM | POA: Diagnosis not present

## 2022-06-21 DIAGNOSIS — M25561 Pain in right knee: Secondary | ICD-10-CM | POA: Diagnosis not present

## 2022-06-24 ENCOUNTER — Ambulatory Visit (INDEPENDENT_AMBULATORY_CARE_PROVIDER_SITE_OTHER): Payer: PPO | Admitting: Family Medicine

## 2022-06-24 ENCOUNTER — Other Ambulatory Visit (HOSPITAL_COMMUNITY)
Admission: RE | Admit: 2022-06-24 | Discharge: 2022-06-24 | Disposition: A | Discharge status: Home / Self Care | Payer: PPO | Source: Ambulatory Visit | Attending: Family Medicine | Admitting: Family Medicine

## 2022-06-24 ENCOUNTER — Encounter: Payer: Self-pay | Admitting: Family Medicine

## 2022-06-24 VITALS — BP 128/70 | HR 72 | Temp 98.2°F | Ht 61.0 in | Wt 221.8 lb

## 2022-06-24 DIAGNOSIS — J309 Allergic rhinitis, unspecified: Secondary | ICD-10-CM

## 2022-06-24 DIAGNOSIS — M1990 Unspecified osteoarthritis, unspecified site: Secondary | ICD-10-CM | POA: Diagnosis not present

## 2022-06-24 DIAGNOSIS — I471 Supraventricular tachycardia: Secondary | ICD-10-CM

## 2022-06-24 DIAGNOSIS — E669 Obesity, unspecified: Secondary | ICD-10-CM

## 2022-06-24 DIAGNOSIS — M8580 Other specified disorders of bone density and structure, unspecified site: Secondary | ICD-10-CM | POA: Diagnosis not present

## 2022-06-24 DIAGNOSIS — Z86010 Personal history of colonic polyps: Secondary | ICD-10-CM

## 2022-06-24 DIAGNOSIS — E039 Hypothyroidism, unspecified: Secondary | ICD-10-CM | POA: Diagnosis not present

## 2022-06-24 DIAGNOSIS — Z124 Encounter for screening for malignant neoplasm of cervix: Secondary | ICD-10-CM

## 2022-06-24 DIAGNOSIS — I700 Atherosclerosis of aorta: Secondary | ICD-10-CM

## 2022-06-24 DIAGNOSIS — Z6838 Body mass index (BMI) 38.0-38.9, adult: Secondary | ICD-10-CM

## 2022-06-24 DIAGNOSIS — E785 Hyperlipidemia, unspecified: Secondary | ICD-10-CM | POA: Diagnosis not present

## 2022-06-24 DIAGNOSIS — Z0000 Encounter for general adult medical examination without abnormal findings: Secondary | ICD-10-CM

## 2022-06-24 DIAGNOSIS — Z23 Encounter for immunization: Secondary | ICD-10-CM | POA: Diagnosis not present

## 2022-06-24 DIAGNOSIS — I10 Essential (primary) hypertension: Secondary | ICD-10-CM | POA: Diagnosis not present

## 2022-06-24 MED ORDER — LISINOPRIL-HYDROCHLOROTHIAZIDE 20-12.5 MG PO TABS: 1.0000 | ORAL_TABLET | Freq: Every day | ORAL | 3 refills | Status: DC

## 2022-06-24 MED ORDER — ATORVASTATIN CALCIUM 20 MG PO TABS: 20.0000 mg | ORAL_TABLET | Freq: Every day | ORAL | 3 refills | Status: DC

## 2022-06-24 NOTE — Patient Instructions (Signed)

## 2022-06-24 NOTE — Progress Notes (Signed)
Paula Cline is a 66 y.o. female who presents for annual wellness visit,CPE and follow-up on chronic medical conditions.  She continues on her thyroid medicine and is having no difficulty with that.  She does have a history of osteopenia and does use calcium and vitamin D.  She uses a sleep medicine usually once or twice per week.  She also uses Xanax very sparingly usually in relation to dealing with her estranged half-brother.  Continues on Lipitor without difficulty.  Allergies are causing no difficulty.  She continues on lisinopril/HCTZ.  She does have a history of SVT but has not had any difficulty recently also has a history of aortic atherosclerosis.  Immunizations and Health Maintenance Immunization History  Administered Date(s) Administered   DTaP 10/25/1996, 07/24/2004   Hepatitis A 07/24/2004, 10/03/2008   Influenza Split 07/31/2011, 10/05/2012, 08/02/2015   Influenza Whole 09/22/2007, 08/19/2008, 10/10/2009   Influenza, Quadrivalent, Recombinant, Inj, Pf 09/01/2018, 08/03/2019   Influenza,inj,Quad PF,6+ Mos 08/13/2013, 10/31/2014, 08/15/2017, 09/01/2020   Influenza-Unspecified 09/06/2016, 08/15/2017, 09/05/2019, 08/08/2021   Moderna Covid-19 Vaccine Bivalent Booster 48yr & up 08/21/2021   PFIZER Comirnaty(Gray Top)Covid-19 Tri-Sucrose Vaccine 02/21/2021   PFIZER(Purple Top)SARS-COV-2 Vaccination 02/02/2020, 02/23/2020, 09/01/2020   PPD Test 12/18/1997   Tdap 11/08/2013   Zoster Recombinat (Shingrix) 02/09/2018, 04/16/2018   Zoster, Live 08/02/2015   Health Maintenance Due  Topic Date Due   Pneumonia Vaccine 66 Years old (1 - PCV) Never done   PAP SMEAR-Modifier  06/02/2022   INFLUENZA VACCINE  06/18/2022    Last Pap smear: 06/03/19 Last mammogram: 05/02/22 Last colonoscopy: 01/31/17 Last DEXA: 08/20/21 Dentist: Q four months  Ophtho: Q year  Exercise: five days a week for 45 min.  Other doctors caring for patient include: Dr. JEdison NasutiGI            Paula Cline             Dr. UCharlcie CradleCardiology   Advanced directives: Does Patient Have a Medical Advance Directive?: Yes Type of Advance Directive: Living will, Healthcare Power of Attorney Does patient want to make changes to medical advance directive?: No - Patient declined Copy of HRhomein Chart?: No - copy requested  Depression screen:  See questionnaire below.     06/24/2022    1:50 PM 06/14/2021    8:31 AM 06/08/2020    8:20 AM 06/03/2019    9:00 AM 02/09/2018   11:44 AM  Depression screen PHQ 2/9  Decreased Interest 0 0 0 0 0  Down, Depressed, Hopeless 0 0 0 0 0  PHQ - 2 Score 0 0 0 0 0    Fall Risk Screen: see questionnaire below.    06/24/2022    1:50 PM 06/14/2021    8:33 AM 06/08/2020    8:20 AM 02/09/2018   11:44 AM 01/16/2017    8:33 AM  Fall Risk   Falls in the past year? 1 0 0 No No  Number falls in past yr: 0 0     Injury with Fall? 0 0     Risk for fall due to : No Fall Risks No Fall Risks No Fall Risks    Follow up Falls evaluation completed  Education provided      ADL screen:  See questionnaire below Functional Status Survey: Is the patient deaf or have difficulty hearing?: No Does the patient have difficulty seeing, even when wearing glasses/contacts?: No Does the patient have difficulty concentrating, remembering, or making decisions?: No Does the  patient have difficulty walking or climbing stairs?: No Does the patient have difficulty dressing or bathing?: No Does the patient have difficulty doing errands alone such as visiting a doctor's office or shopping?: No   Review of Systems Constitutional: -, -unexpected weight change, -anorexia, -fatigue Allergy: -sneezing, -itching, -congestion Dermatology: denies changing moles, rash, lumps ENT: -runny nose, -ear pain, -sore throat,  Cardiology:  -chest pain, -palpitations, -orthopnea, Respiratory: -cough, -shortness of breath, -dyspnea on exertion, -wheezing,  Gastroenterology: -abdominal pain, -nausea,  -vomiting, -diarrhea, -constipation, -dysphagia Hematology: -bleeding or bruising problems Musculoskeletal: -arthralgias, -myalgias, -joint swelling, -back pain, - Ophthalmology: -vision changes,  Urology: -dysuria, -difficulty urinating,  -urinary frequency, -urgency, incontinence Neurology: -, -numbness, , -memory loss, -falls, -dizziness    PHYSICAL EXAM:  BP 128/70   Pulse 72   Temp 98.2 F (36.8 C)   Ht '5\' 1"'$  (1.549 m)   Wt 221 lb 12.8 oz (100.6 kg)   LMP 11/18/2010   SpO2 98%   BMI 41.91 kg/m   General Appearance: Alert, cooperative, no distress, appears stated age Head: Normocephalic, without obvious abnormality, atraumatic Eyes: PERRL, conjunctiva/corneas clear, EOM's intact,  Ears: Normal TM's and external ear canals Nose: Nares normal, mucosa normal, no drainage or sinus tenderness Throat: Lips, mucosa, and tongue normal; teeth and gums normal Neck: Supple, no lymphadenopathy;  thyroid:  no enlargement/tenderness/nodules; no carotid bruit or JVD Lungs: Clear to auscultation bilaterally without wheezes, rales or ronchi; respirations unlabored Heart: Regular rate and rhythm, S1 and S2 normal, no murmur, rubor gallop Abdomen: Soft, non-tender, nondistended, normoactive bowel sounds,  no masses, no hepatosplenomegaly Extremities: No clubbing, cyanosis or edema Pulses: 2+ and symmetric all extremities Skin:  Skin color, texture, turgor normal, no rashes or lesions Lymph nodes: Cervical, supraclavicular, and axillary nodes normal Neurologic:  CNII-XII intact, normal strength, sensation and gait; reflexes 2+ and symmetric throughout Psych: Normal mood, affect, hygiene and grooming. Pelvic exam done.  No masses appreciated.  Pap smear done. ASSESSMENT/PLAN: Routine general medical examination at a health care facility - Plan: CBC with Differential/Platelet, Comprehensive metabolic panel  Need for vaccination against Streptococcus pneumoniae - Plan: Pneumococcal conjugate  vaccine 20-valent  Screening for cervical cancer - Plan: Cytology - PAP([place])  Allergic rhinitis, mild  Hypothyroidism, unspecified type - Plan: TSH  Atherosclerosis of aorta (HCC) - Plan: Lipid panel, atorvastatin (LIPITOR) 20 MG tablet  Class 2 obesity without serious comorbidity with body mass index (BMI) of 38.0 to 38.9 in adult, unspecified obesity type  Essential hypertension - Plan: CBC with Differential/Platelet, Comprehensive metabolic panel, lisinopril-hydrochlorothiazide (ZESTORETIC) 20-12.5 MG tablet  History of colonic polyps  Paroxysmal SVT (supraventricular tachycardia) (HCC) - Plan: CBC with Differential/Platelet, Comprehensive metabolic panel, Lipid panel  Osteopenia, unspecified location  Hyperlipidemia with target LDL less than 100 - Plan: atorvastatin (LIPITOR) 20 MG tablet    Discussed  mammograms; at least 30 minutes of aerobic activity at least 5 days/week and weight-bearing exercise 2x/week;  healthy diet, including goals of calcium and vitamin D intake .  Immunization recommendations discussed.  Colonoscopy recommendations reviewed   Medicare Attestation I have personally reviewed: The patient's medical and social history Their use of alcohol, tobacco or illicit drugs Their current medications and supplements The patient's functional ability including ADLs,fall risks, home safety risks, cognitive, and hearing and visual impairment Diet and physical activities Evidence for depression or mood disorders  The patient's weight, height, and BMI have been recorded in the chart.  I have made referrals, counseling, and provided education to the patient  based on review of the above and I have provided the patient with a written personalized care plan for preventive services.     Jill Alexanders, MD   06/24/2022

## 2022-06-25 ENCOUNTER — Other Ambulatory Visit: Payer: Self-pay

## 2022-06-25 ENCOUNTER — Telehealth: Payer: Self-pay | Admitting: Family Medicine

## 2022-06-25 DIAGNOSIS — E039 Hypothyroidism, unspecified: Secondary | ICD-10-CM

## 2022-06-25 LAB — LIPID PANEL
Chol/HDL Ratio: 2.3 ratio (ref 0.0–4.4)
Cholesterol, Total: 164 mg/dL (ref 100–199)
HDL: 70 mg/dL (ref >39)
LDL Chol Calc (NIH): 86 mg/dL (ref 0–99)
Triglycerides: 34 mg/dL (ref 0–149)
VLDL Cholesterol Cal: 8 mg/dL (ref 5–40)

## 2022-06-25 LAB — COMPREHENSIVE METABOLIC PANEL
ALT: 14 IU/L (ref 0–32)
AST: 18 IU/L (ref 0–40)
Albumin/Globulin Ratio: 1.7 (ref 1.2–2.2)
Albumin: 3.9 g/dL (ref 3.9–4.9)
Alkaline Phosphatase: 71 IU/L (ref 44–121)
BUN/Creatinine Ratio: 24 (ref 12–28)
BUN: 22 mg/dL (ref 8–27)
Bilirubin Total: 0.4 mg/dL (ref 0.0–1.2)
CO2: 22 mmol/L (ref 20–29)
Calcium: 9.3 mg/dL (ref 8.7–10.3)
Chloride: 101 mmol/L (ref 96–106)
Creatinine, Ser: 0.91 mg/dL (ref 0.57–1.00)
Globulin, Total: 2.3 g/dL (ref 1.5–4.5)
Glucose: 96 mg/dL (ref 70–99)
Potassium: 4.5 mmol/L (ref 3.5–5.2)
Sodium: 137 mmol/L (ref 134–144)
Total Protein: 6.2 g/dL (ref 6.0–8.5)
eGFR: 70 mL/min/{1.73_m2} (ref >59)

## 2022-06-25 LAB — CBC WITH DIFFERENTIAL/PLATELET
Basophils Absolute: 0.1 10*3/uL (ref 0.0–0.2)
Basos: 1 %
EOS (ABSOLUTE): 0.1 10*3/uL (ref 0.0–0.4)
Eos: 1 %
Hematocrit: 41.3 % (ref 34.0–46.6)
Hemoglobin: 13.6 g/dL (ref 11.1–15.9)
Immature Grans (Abs): 0.1 10*3/uL (ref 0.0–0.1)
Immature Granulocytes: 1 %
Lymphocytes Absolute: 2.2 10*3/uL (ref 0.7–3.1)
Lymphs: 24 %
MCH: 29.6 pg (ref 26.6–33.0)
MCHC: 32.9 g/dL (ref 31.5–35.7)
MCV: 90 fL (ref 79–97)
Monocytes Absolute: 0.7 10*3/uL (ref 0.1–0.9)
Monocytes: 8 %
Neutrophils Absolute: 6.1 10*3/uL (ref 1.4–7.0)
Neutrophils: 65 %
Platelets: 258 10*3/uL (ref 150–450)
RBC: 4.59 x10E6/uL (ref 3.77–5.28)
RDW: 12.7 % (ref 11.7–15.4)
WBC: 9.2 10*3/uL (ref 3.4–10.8)

## 2022-06-25 LAB — TSH: TSH: 1.23 u[IU]/mL (ref 0.450–4.500)

## 2022-06-25 MED ORDER — SYNTHROID 125 MCG PO TABS: 125.0000 ug | ORAL_TABLET | Freq: Every day | ORAL | 1 refills | Status: DC

## 2022-06-25 NOTE — Telephone Encounter (Signed)
Done kh

## 2022-06-25 NOTE — Telephone Encounter (Signed)
Pharmacy is requesting a refill on her synthroid 125 mcg Please send to the [facility] ([place], [place]

## 2022-06-26 DIAGNOSIS — M25561 Pain in right knee: Secondary | ICD-10-CM | POA: Diagnosis not present

## 2022-06-26 DIAGNOSIS — M25562 Pain in left knee: Secondary | ICD-10-CM | POA: Diagnosis not present

## 2022-06-26 LAB — CYTOLOGY - PAP: Diagnosis: NEGATIVE

## 2022-06-28 DIAGNOSIS — M25562 Pain in left knee: Secondary | ICD-10-CM | POA: Diagnosis not present

## 2022-06-28 DIAGNOSIS — M25561 Pain in right knee: Secondary | ICD-10-CM | POA: Diagnosis not present

## 2022-06-30 ENCOUNTER — Encounter: Payer: Self-pay | Admitting: Family Medicine

## 2022-07-01 ENCOUNTER — Ambulatory Visit (INDEPENDENT_AMBULATORY_CARE_PROVIDER_SITE_OTHER): Payer: PPO | Admitting: Family Medicine

## 2022-07-01 ENCOUNTER — Encounter: Payer: Self-pay | Admitting: Family Medicine

## 2022-07-01 VITALS — BP 120/82 | HR 74 | Temp 98.4°F | Wt 224.6 lb

## 2022-07-01 DIAGNOSIS — Q899 Congenital malformation, unspecified: Secondary | ICD-10-CM | POA: Diagnosis not present

## 2022-07-01 NOTE — Progress Notes (Signed)
   Subjective:    Patient ID: Paula Cline, female    DOB: 11-09-1956, 66 y.o.   MRN: [id number]  HPI She states that she noted some slight blood-tinged drainage from her umbilical area yesterday.  She used some Neosporin on this and has not seen any problems since then.  No abdominal pain nausea or vomiting.   Review of Systems     Objective:   Physical Exam Exam of the umbilical area shows the skin to be totally normal with no evidence of erythema drainage or tenderness.       Assessment & Plan:  Umbilical abnormality I explained that since there was no evidence of any problem at the present time, continue with Neosporin however if she notes erythema drainage or pain to call me back

## 2022-07-10 DIAGNOSIS — M25561 Pain in right knee: Secondary | ICD-10-CM | POA: Diagnosis not present

## 2022-07-10 DIAGNOSIS — M25562 Pain in left knee: Secondary | ICD-10-CM | POA: Diagnosis not present

## 2022-07-12 DIAGNOSIS — M25562 Pain in left knee: Secondary | ICD-10-CM | POA: Diagnosis not present

## 2022-07-12 DIAGNOSIS — M25561 Pain in right knee: Secondary | ICD-10-CM | POA: Diagnosis not present

## 2022-07-17 DIAGNOSIS — M25562 Pain in left knee: Secondary | ICD-10-CM | POA: Diagnosis not present

## 2022-07-17 DIAGNOSIS — M25561 Pain in right knee: Secondary | ICD-10-CM | POA: Diagnosis not present

## 2022-07-24 ENCOUNTER — Encounter: Payer: Self-pay | Admitting: Internal Medicine

## 2022-07-30 DIAGNOSIS — M25562 Pain in left knee: Secondary | ICD-10-CM | POA: Diagnosis not present

## 2022-07-30 DIAGNOSIS — M25561 Pain in right knee: Secondary | ICD-10-CM | POA: Diagnosis not present

## 2022-08-09 ENCOUNTER — Other Ambulatory Visit: Payer: Self-pay | Admitting: Family Medicine

## 2022-08-09 NOTE — Telephone Encounter (Signed)
Paula Cline is requesting to fill pt  [email or web address]. Please advise. Paula Cline

## 2022-08-27 ENCOUNTER — Encounter: Payer: Self-pay | Admitting: Internal Medicine

## 2022-09-07 ENCOUNTER — Other Ambulatory Visit (HOSPITAL_COMMUNITY): Payer: Self-pay

## 2022-09-09 ENCOUNTER — Encounter: Payer: Self-pay | Admitting: Internal Medicine

## 2022-09-15 ENCOUNTER — Other Ambulatory Visit: Payer: Self-pay | Admitting: Family Medicine

## 2022-09-16 NOTE — Telephone Encounter (Signed)
Refill request last apt 06/24/22 next apt 06/26/23.

## 2022-09-19 DIAGNOSIS — M25562 Pain in left knee: Secondary | ICD-10-CM | POA: Diagnosis not present

## 2022-09-19 DIAGNOSIS — M25561 Pain in right knee: Secondary | ICD-10-CM | POA: Diagnosis not present

## 2022-11-05 ENCOUNTER — Other Ambulatory Visit: Payer: Self-pay | Admitting: Family Medicine

## 2022-11-17 DIAGNOSIS — Z20822 Contact with and (suspected) exposure to covid-19: Secondary | ICD-10-CM | POA: Diagnosis not present

## 2022-11-17 DIAGNOSIS — J209 Acute bronchitis, unspecified: Secondary | ICD-10-CM | POA: Diagnosis not present

## 2022-11-17 DIAGNOSIS — R059 Cough, unspecified: Secondary | ICD-10-CM | POA: Diagnosis not present

## 2022-11-19 ENCOUNTER — Telehealth: Payer: PPO | Admitting: Family Medicine

## 2022-11-20 NOTE — Progress Notes (Signed)
Erroneous encounter

## 2022-12-02 ENCOUNTER — Encounter: Payer: Self-pay | Admitting: Family Medicine

## 2022-12-03 ENCOUNTER — Ambulatory Visit (INDEPENDENT_AMBULATORY_CARE_PROVIDER_SITE_OTHER): Payer: PPO | Admitting: Family Medicine

## 2022-12-03 ENCOUNTER — Encounter: Payer: Self-pay | Admitting: Family Medicine

## 2022-12-03 VITALS — BP 160/80 | HR 89 | Temp 98.0°F | Resp 18 | Wt 227.4 lb

## 2022-12-03 DIAGNOSIS — Z8616 Personal history of COVID-19: Secondary | ICD-10-CM | POA: Diagnosis not present

## 2022-12-03 DIAGNOSIS — J40 Bronchitis, not specified as acute or chronic: Secondary | ICD-10-CM

## 2022-12-03 MED ORDER — BENZONATATE 200 MG PO CAPS: 200.0000 mg | ORAL_CAPSULE | Freq: Two times a day (BID) | ORAL | 0 refills | Status: DC | PRN

## 2022-12-03 MED ORDER — AMOXICILLIN-POT CLAVULANATE 875-125 MG PO TABS: 1.0000 | ORAL_TABLET | Freq: Two times a day (BID) | ORAL | 0 refills | Status: DC

## 2022-12-03 NOTE — Progress Notes (Signed)
   Subjective:    Patient ID: Paula Cline, female    DOB: 31-Jan-1956, 67 y.o.   MRN: [id number]  HPI She states that she had COVID and early December however did return to normal but in mid December she developed symptoms of sinus congestion, cough.  Become productive as well as chills.  She was seen in urgent care center and given Augmentin, prednisone and Tessalon Perles.  She states that she got roughly 80% better but is still having difficulty with intermittent productive cough but no fever, chills.   Review of Systems     Objective:   Physical Exam Alert and in no distress. Tympanic membranes and canals are normal. Pharyngeal area is normal. Neck is supple without adenopathy or thyromegaly. Cardiac exam shows a regular sinus rhythm without murmurs or gallops. Lungs are clear to auscultation.        Assessment & Plan:  Bronchitis - Plan: amoxicillin-clavulanate (AUGMENTIN) 875-125 MG tablet, benzonatate (TESSALON) 200 MG capsule  History of COVID-19 I explained that she had 80% better but did not get over entirely.  She is to finish this course of antibiotic and cough suppressant and let me know whether she is returned to her normal state when she finishes this.

## 2022-12-04 DIAGNOSIS — M170 Bilateral primary osteoarthritis of knee: Secondary | ICD-10-CM | POA: Diagnosis not present

## 2022-12-10 ENCOUNTER — Encounter: Payer: Self-pay | Admitting: Family Medicine

## 2022-12-11 ENCOUNTER — Encounter: Payer: Self-pay | Admitting: Family Medicine

## 2022-12-11 ENCOUNTER — Ambulatory Visit (INDEPENDENT_AMBULATORY_CARE_PROVIDER_SITE_OTHER): Payer: PPO | Admitting: Family Medicine

## 2022-12-11 VITALS — BP 130/80 | HR 88 | Temp 97.9°F | Resp 18 | Wt 220.6 lb

## 2022-12-11 DIAGNOSIS — I10 Essential (primary) hypertension: Secondary | ICD-10-CM | POA: Diagnosis not present

## 2022-12-11 DIAGNOSIS — F419 Anxiety disorder, unspecified: Secondary | ICD-10-CM

## 2022-12-11 DIAGNOSIS — M170 Bilateral primary osteoarthritis of knee: Secondary | ICD-10-CM | POA: Diagnosis not present

## 2022-12-11 MED ORDER — ALPRAZOLAM 0.25 MG PO TABS: 0.2500 mg | ORAL_TABLET | Freq: Two times a day (BID) | ORAL | 1 refills | Status: DC | PRN

## 2022-12-11 NOTE — Progress Notes (Signed)
   Subjective:    Patient ID: Paula Cline, female    DOB: [birth year], 67 y.o.   MRN: [id number]  HPI She is here for consult concerning her blood pressure and anxiety.  She is now retired and is in the process of making lifestyle changes in regard to diet and exercise.  She wants to lose weight noted to hopefully have a knee replacement down the road.  She does check her blood pressure at home and has concerns over some recent readings that were elevated.  She also has noted increased difficulty with anxiety.  She does occasionally use Xanax.  She does use relaxation techniques before using the Xanax and seems to have a fairly good handle on this.   Review of Systems     Objective:   Physical Exam Alert and in no distress.  Her blood pressure cuff was measured against ours and is fairly accurate.       Assessment & Plan:  Essential hypertension  Anxiety - Plan: ALPRAZolam (XANAX) 0.25 MG tablet Discussed blood pressure and treatment as well as monitoring.  Explained that we are interested in making changes only after it is truly a sustained elevated blood pressure over several months.  Discussed the fact that blood pressure is asymptomatic and not related to headache, weakness etc. I then discussed the anxiety with her.  Reinforced the need for her to continue to use her relaxation techniques and then use the Xanax only on an as-needed basis.  She seems to have a good handle on it.  She will keep track of her blood pressure on a monthly basis rather than more often and that I explained the fact that we are trying to eliminate sustained elevated blood pressure rather than stated a week to week.  28 minutes spent discussing all these issues with her.

## 2022-12-18 DIAGNOSIS — M170 Bilateral primary osteoarthritis of knee: Secondary | ICD-10-CM | POA: Diagnosis not present

## 2022-12-30 ENCOUNTER — Ambulatory Visit: Payer: PPO | Admitting: Physician Assistant

## 2022-12-30 ENCOUNTER — Telehealth: Payer: Self-pay | Admitting: Family Medicine

## 2022-12-30 ENCOUNTER — Other Ambulatory Visit: Payer: Self-pay

## 2022-12-30 DIAGNOSIS — I10 Essential (primary) hypertension: Secondary | ICD-10-CM

## 2022-12-30 DIAGNOSIS — I700 Atherosclerosis of aorta: Secondary | ICD-10-CM

## 2022-12-30 DIAGNOSIS — E039 Hypothyroidism, unspecified: Secondary | ICD-10-CM

## 2022-12-30 DIAGNOSIS — E785 Hyperlipidemia, unspecified: Secondary | ICD-10-CM

## 2022-12-30 MED ORDER — LISINOPRIL-HYDROCHLOROTHIAZIDE 20-12.5 MG PO TABS: 1.0000 | ORAL_TABLET | Freq: Every day | ORAL | 1 refills | Status: DC

## 2022-12-30 MED ORDER — ATORVASTATIN CALCIUM 20 MG PO TABS: 20.0000 mg | ORAL_TABLET | Freq: Every day | ORAL | 1 refills | Status: DC

## 2022-12-30 MED ORDER — SYNTHROID 125 MCG PO TABS: 125.0000 ug | ORAL_TABLET | Freq: Every day | ORAL | 1 refills | Status: DC

## 2022-12-30 NOTE — Telephone Encounter (Signed)
Pt's pharmacy has changed to [facility] [place], she needs refills on Synthroid, Lisinopril, Atorvastatin

## 2023-01-16 DIAGNOSIS — M170 Bilateral primary osteoarthritis of knee: Secondary | ICD-10-CM | POA: Diagnosis not present

## 2023-01-31 ENCOUNTER — Other Ambulatory Visit: Payer: Self-pay | Admitting: Family Medicine

## 2023-01-31 NOTE — Telephone Encounter (Signed)
Refill request last apt 12/11/22.

## 2023-02-11 ENCOUNTER — Other Ambulatory Visit (INDEPENDENT_AMBULATORY_CARE_PROVIDER_SITE_OTHER): Payer: PPO

## 2023-02-11 DIAGNOSIS — Z23 Encounter for immunization: Secondary | ICD-10-CM | POA: Diagnosis not present

## 2023-03-03 ENCOUNTER — Telehealth: Payer: Self-pay | Admitting: Family Medicine

## 2023-03-03 NOTE — Telephone Encounter (Signed)
Contacted KARMANN BLAUSTEIN to schedule their annual wellness visit. Appointment made for 03/04/23.  Rudell Cobb AWV direct phone # [phone]

## 2023-03-04 ENCOUNTER — Ambulatory Visit (INDEPENDENT_AMBULATORY_CARE_PROVIDER_SITE_OTHER): Payer: PPO

## 2023-03-04 VITALS — Ht 62.0 in | Wt 197.0 lb

## 2023-03-04 DIAGNOSIS — Z0000 Encounter for general adult medical examination without abnormal findings: Secondary | ICD-10-CM

## 2023-03-04 NOTE — Patient Instructions (Signed)
[name] , Thank you for taking time to come for your Medicare Wellness Visit. I appreciate your ongoing commitment to your health goals. Please review the following plan we discussed and let me know if I can assist you in the future.   These are the goals we discussed:  Goals      Patient Stated     03/04/2023, wants to lose weight        This is a list of the screening recommended for you and due dates:  Health Maintenance  Topic Date Due   COVID-19 Vaccine (8 - 2023-24 season) 04/08/2023   Flu Shot  06/19/2023   DTaP/Tdap/Td vaccine (4 - Td or Tdap) 11/09/2023   Colon Cancer Screening  02/01/2024   Medicare Annual Wellness Visit  03/03/2024   Mammogram  05/02/2024   Pneumonia Vaccine  Completed   DEXA scan (bone density measurement)  Completed   Hepatitis C Screening: USPSTF Recommendation to screen - Ages 70-79 yo.  Completed   Zoster (Shingles) Vaccine  Completed   HPV Vaccine  Aged Out    Advanced directives: copy in chart  Conditions/risks identified: none  Next appointment: Follow up in one year for your annual wellness visit    Preventive Care 65 Years and Older, Female Preventive care refers to lifestyle choices and visits with your health care provider that can promote health and wellness. What does preventive care include? A yearly physical exam. This is also called an annual well check. Dental exams once or twice a year. Routine eye exams. Ask your health care provider how often you should have your eyes checked. Personal lifestyle choices, including: Daily care of your teeth and gums. Regular physical activity. Eating a healthy diet. Avoiding tobacco and drug use. Limiting alcohol use. Practicing safe sex. Taking low-dose aspirin every day. Taking vitamin and mineral supplements as recommended by your health care provider. What happens during an annual well check? The services and screenings done by your health care provider during your annual well  check will depend on your age, overall health, lifestyle risk factors, and family history of disease. Counseling  Your health care provider may ask you questions about your: Alcohol use. Tobacco use. Drug use. Emotional well-being. Home and relationship well-being. Sexual activity. Eating habits. History of falls. Memory and ability to understand (cognition). Work and work Astronomer. Reproductive health. Screening  You may have the following tests or measurements: Height, weight, and BMI. Blood pressure. Lipid and cholesterol levels. These may be checked every 5 years, or more frequently if you are over 36 years old. Skin check. Lung cancer screening. You may have this screening every year starting at age 14 if you have a 30-pack-year history of smoking and currently smoke or have quit within the past 15 years. Fecal occult blood test (FOBT) of the stool. You may have this test every year starting at age 26. Flexible sigmoidoscopy or colonoscopy. You may have a sigmoidoscopy every 5 years or a colonoscopy every 10 years starting at age 2. Hepatitis C blood test. Hepatitis B blood test. Sexually transmitted disease (STD) testing. Diabetes screening. This is done by checking your blood sugar (glucose) after you have not eaten for a while (fasting). You may have this done every 1-3 years. Bone density scan. This is done to screen for osteoporosis. You may have this done starting at age 63. Mammogram. This may be done every 1-2 years. Talk to your health care provider about how often you should have regular  mammograms. Talk with your health care provider about your test results, treatment options, and if necessary, the need for more tests. Vaccines  Your health care provider may recommend certain vaccines, such as: Influenza vaccine. This is recommended every year. Tetanus, diphtheria, and acellular pertussis (Tdap, Td) vaccine. You may need a Td booster every 10 years. Zoster  vaccine. You may need this after age 32. Pneumococcal 13-valent conjugate (PCV13) vaccine. One dose is recommended after age 60. Pneumococcal polysaccharide (PPSV23) vaccine. One dose is recommended after age 70. Talk to your health care provider about which screenings and vaccines you need and how often you need them. This information is not intended to replace advice given to you by your health care provider. Make sure you discuss any questions you have with your health care provider. Document Released: 12/01/2015 Document Revised: 07/24/2016 Document Reviewed: 09/05/2015 Elsevier Interactive Patient Education  2017 [employer] Prevention in the Home Falls can cause injuries. They can happen to people of all ages. There are many things you can do to make your home safe and to help prevent falls. What can I do on the outside of my home? Regularly fix the edges of walkways and driveways and fix any cracks. Remove anything that might make you trip as you walk through a door, such as a raised step or threshold. Trim any bushes or trees on the path to your home. Use bright outdoor lighting. Clear any walking paths of anything that might make someone trip, such as rocks or tools. Regularly check to see if handrails are loose or broken. Make sure that both sides of any steps have handrails. Any raised decks and porches should have guardrails on the edges. Have any leaves, snow, or ice cleared regularly. Use sand or salt on walking paths during winter. Clean up any spills in your garage right away. This includes oil or grease spills. What can I do in the bathroom? Use night lights. Install grab bars by the toilet and in the tub and shower. Do not use towel bars as grab bars. Use non-skid mats or decals in the tub or shower. If you need to sit down in the shower, use a plastic, non-slip stool. Keep the floor dry. Clean up any water that spills on the floor as soon as it happens. Remove  soap buildup in the tub or shower regularly. Attach bath mats securely with double-sided non-slip rug tape. Do not have throw rugs and other things on the floor that can make you trip. What can I do in the bedroom? Use night lights. Make sure that you have a light by your bed that is easy to reach. Do not use any sheets or blankets that are too big for your bed. They should not hang down onto the floor. Have a firm chair that has side arms. You can use this for support while you get dressed. Do not have throw rugs and other things on the floor that can make you trip. What can I do in the kitchen? Clean up any spills right away. Avoid walking on wet floors. Keep items that you use a lot in easy-to-reach places. If you need to reach something above you, use a strong step stool that has a grab bar. Keep electrical cords out of the way. Do not use floor polish or wax that makes floors slippery. If you must use wax, use non-skid floor wax. Do not have throw rugs and other things on the floor that can  make you trip. What can I do with my stairs? Do not leave any items on the stairs. Make sure that there are handrails on both sides of the stairs and use them. Fix handrails that are broken or loose. Make sure that handrails are as long as the stairways. Check any carpeting to make sure that it is firmly attached to the stairs. Fix any carpet that is loose or worn. Avoid having throw rugs at the top or bottom of the stairs. If you do have throw rugs, attach them to the floor with carpet tape. Make sure that you have a light switch at the top of the stairs and the bottom of the stairs. If you do not have them, ask someone to add them for you. What else can I do to help prevent falls? Wear shoes that: Do not have high heels. Have rubber bottoms. Are comfortable and fit you well. Are closed at the toe. Do not wear sandals. If you use a stepladder: Make sure that it is fully opened. Do not climb a  closed stepladder. Make sure that both sides of the stepladder are locked into place. Ask someone to hold it for you, if possible. Clearly mark and make sure that you can see: Any grab bars or handrails. First and last steps. Where the edge of each step is. Use tools that help you move around (mobility aids) if they are needed. These include: Canes. Walkers. Scooters. Crutches. Turn on the lights when you go into a dark area. Replace any light bulbs as soon as they burn out. Set up your furniture so you have a clear path. Avoid moving your furniture around. If any of your floors are uneven, fix them. If there are any pets around you, be aware of where they are. Review your medicines with your doctor. Some medicines can make you feel dizzy. This can increase your chance of falling. Ask your doctor what other things that you can do to help prevent falls. This information is not intended to replace advice given to you by your health care provider. Make sure you discuss any questions you have with your health care provider. Document Released: 08/31/2009 Document Revised: 04/11/2016 Document Reviewed: 12/09/2014 Elsevier Interactive Patient Education  2017 [employer].

## 2023-03-04 NOTE — Progress Notes (Signed)
I connected with  Paula Cline on 03/04/23 by a audio enabled telemedicine application and verified that I am speaking with the correct person using two identifiers.  Patient Location: Home  Provider Location: Office/Clinic  I discussed the limitations of evaluation and management by telemedicine. The patient expressed understanding and agreed to proceed.  Subjective:   Paula Cline is a 67 y.o. female who presents for Medicare Annual (Subsequent) preventive examination.  Patient Medicare AWV questionnaire was completed by the patient on 03/04/2023; I have confirmed that all information answered by patient is correct and no changes since this date.     Review of Systems     Cardiac Risk Factors include: advanced age (>41men, >51 women);dyslipidemia;hypertension;obesity (BMI >30kg/m2)     Objective:    Today's Vitals   03/04/23 1011  Weight: 197 lb (89.4 kg)  Height: 5\' 2"  (1.575 m)   Body mass index is 36.03 kg/m.     03/04/2023   10:16 AM 06/24/2022    1:49 PM 03/01/2020   12:48 PM 10/27/2019    3:44 AM 10/26/2019    1:27 PM 01/31/2017    7:26 AM  Advanced Directives  Does Patient Have a Medical Advance Directive? Yes Yes Yes  Yes Yes  Type of Estate agent of [place];Living will Living will;Healthcare Power of [employer] Power of Attorney Living will;Healthcare Power of Attorney Living will;Healthcare Power of Attorney   Does patient want to make changes to medical advance directive?  No - Patient declined No - Patient declined No - Patient declined    Copy of Healthcare Power of Attorney in Chart? Yes - validated most recent copy scanned in chart (See row information) No - copy requested   No - copy requested   Would patient like information on creating a medical advance directive?    No - Patient declined      Current Medications (verified) Outpatient Encounter Medications as of 03/04/2023  Medication Sig   ALPRAZolam (XANAX) 0.25 MG  tablet Take 1 tablet (0.25 mg total) by mouth 2 (two) times daily as needed for anxiety.   atorvastatin (LIPITOR) 20 MG tablet Take 1 tablet (20 mg total) by mouth daily.   b complex vitamins capsule Take 1 capsule by mouth daily.   calcium carbonate (OSCAL) 1500 (600 Ca) MG TABS tablet Take 600 mg of elemental calcium by mouth daily.   Carboxymethylcellul-Glycerin (LUBRICATING EYE DROPS OP) Place 1 drop into both eyes daily as needed (dry eyes).   Cholecalciferol (VITAMIN D) 50 MCG (2000 UT) tablet Take 2,000 Units by mouth daily.   diclofenac sodium (VOLTAREN) 1 % GEL Apply 1 application topically 2 (two) times daily as needed (knee pain).    Eszopiclone 3 MG TABS TAKE 1 TABLET BY MOUTH IMMEDIATELY AT BEDTIME AS NEEDED   fluticasone (FLONASE) 50 MCG/ACT nasal spray Place 1 spray into both nostrils daily as needed for allergies or rhinitis.   Glucosamine-Chondroit-Vit C-Mn (GLUCOSAMINE CHONDR 1500 COMPLX) CAPS Take 1 capsule by mouth daily.   lisinopril-hydrochlorothiazide (ZESTORETIC) 20-12.5 MG tablet Take 1 tablet by mouth daily.   loratadine (CLARITIN) 10 MG tablet Take 10 mg by mouth daily.   Magnesium 400 MG CAPS Take 400 mg by mouth at bedtime as needed (muscle relaxation).   Multiple Vitamins-Minerals (WOMENS 50+ MULTI VITAMIN/MIN) TABS Take 1 tablet by mouth daily.    Potassium 99 MG TABS Take 99 mg by mouth at bedtime as needed (muscle relaxation).   SYNTHROID 125 MCG tablet Take  1 tablet (125 mcg total) by mouth daily.   valACYclovir (VALTREX) 1000 MG tablet TAKE TWO TABLETS BY MOUTH TWICE A DAY FOR 1 DAY , THEN AS DIRECTED BY YOUR DOCTOR   amoxicillin-clavulanate (AUGMENTIN) 875-125 MG tablet Take 1 tablet by mouth 2 (two) times daily.   benzonatate (TESSALON) 200 MG capsule Take 1 capsule (200 mg total) by mouth 2 (two) times daily as needed for cough.   No facility-administered encounter medications on file as of 03/04/2023.    Allergies (verified) Codeine and Hydrocodone    History: Past Medical History:  Diagnosis Date   Allergy    RHINITIS   Arthritis    Diverticulitis    Former smoker    Hypercholesteremia    Hypertension    Obesity    SVT (supraventricular tachycardia) 02/2020   SVT (supraventricular tachycardia) 10/27/2019   Thyroid disease    Past Surgical History:  Procedure Laterality Date   COLONOSCOPY  2007/2008   Dr. Christella Hartigan   KNEE ARTHROSCOPY  2012   Right knee   SVT ABLATION N/A 03/01/2020   Procedure: SVT ABLATION;  Surgeon: Regan Lemming, MD;  Location: [facility];  Service: Cardiovascular;  Laterality: N/A;   WISDOM TOOTH EXTRACTION     Family History  Problem Relation Age of Onset   Cancer Father 17       Lung cancer   Alzheimer's disease Father    Hypertension Father    Cancer Mother 38       Lung cancer   CAD Paternal Grandfather        died of heart attack   Colon cancer Neg Hx    Social History   Socioeconomic History   Marital status: Single    Spouse name: Not on file   Number of children: Not on file   Years of education: Not on file   Highest education level: Not on file  Occupational History   Not on file  Tobacco Use   Smoking status: Former    Types: Cigarettes    Quit date: 05/22/1993    Years since quitting: 29.8   Smokeless tobacco: Never  Vaping Use   Vaping Use: Never used  Substance and Sexual Activity   Alcohol use: Yes    Comment: rare   Drug use: No   Sexual activity: Not Currently  Other Topics Concern   Not on file  Social History Narrative   Not on file   Social Determinants of Health   Financial Resource Strain: Low Risk  (03/04/2023)   Overall Financial Resource Strain (CARDIA)    Difficulty of Paying Living Expenses: Not hard at all  Food Insecurity: No Food Insecurity (03/04/2023)   Hunger Vital Sign    Worried About Running Out of Food in the Last Year: Never true    Ran Out of Food in the Last Year: Never true  Transportation Needs: No Transportation Needs  (03/04/2023)   PRAPARE - Administrator, Civil Service (Medical): No    Lack of Transportation (Non-Medical): No  Physical Activity: Insufficiently Active (03/04/2023)   Exercise Vital Sign    Days of Exercise per Week: 3 days    Minutes of Exercise per Session: 30 min  Stress: No Stress Concern Present (03/04/2023)   [employer] of Occupational Health - Occupational Stress Questionnaire    Feeling of Stress : Only a little  Social Connections: Unknown (03/04/2023)   Social Connection and Isolation Panel [NHANES]    Frequency  of Communication with Friends and Family: More than three times a week    Frequency of Social Gatherings with Friends and Family: Twice a week    Attends Religious Services: Not on Marketing executive or Organizations: Yes    Attends Engineer, structural: More than 4 times per year    Marital Status: Never married    Tobacco Counseling Counseling given: Not Answered   Clinical Intake:  Pre-visit preparation completed: Yes  Pain : No/denies pain     Nutritional Status: BMI > 30  Obese Nutritional Risks: None Diabetes: No  How often do you need to have someone help you when you read instructions, pamphlets, or other written materials from your doctor or pharmacy?: 1 - Never  Diabetic? no  Interpreter Needed?: No  Information entered by :: NAllen LPN   Activities of Daily Living    03/04/2023    8:44 AM 06/24/2022    1:50 PM  In your present state of health, do you have any difficulty performing the following activities:  Hearing? 0 0  Vision? 0 0  Difficulty concentrating or making decisions? 0 0  Walking or climbing stairs? 0 0  Dressing or bathing? 0 0  Doing errands, shopping? 0 0  Preparing Food and eating ? N N  Using the Toilet? N N  In the past six months, have you accidently leaked urine? Y Y  Do you have problems with loss of bowel control? N N  Managing your Medications? N N  Managing your  Finances? N N  Housekeeping or managing your Housekeeping? N N    Patient Care Team: Ronnald Nian, MD as PCP - General Chilton Si, MD as PCP - Cardiology (Cardiology)  Indicate any recent Medical Services you may have received from other than [facility] providers in the past year (date may be approximate).     Assessment:   This is a routine wellness examination for Paula Cline.  Hearing/Vision screen Vision Screening - Comments:: Regular eye exams, [facility]  Dietary issues and exercise activities discussed: Current Exercise Habits: Home exercise routine, Type of exercise: walking, Time (Minutes): 30, Frequency (Times/Week): 3, Weekly Exercise (Minutes/Week): 90   Goals Addressed             This Visit's Progress    Patient Stated       03/04/2023, wants to lose weight       Depression Screen    03/04/2023   10:17 AM 06/24/2022    1:50 PM 06/14/2021    8:31 AM 06/08/2020    8:20 AM 06/03/2019    9:00 AM 02/09/2018   11:44 AM 01/16/2017    8:33 AM  PHQ 2/9 Scores  PHQ - 2 Score 0 0 0 0 0 0 0  PHQ- 9 Score 0          Fall Risk    03/04/2023    8:44 AM 12/03/2022    3:49 PM 06/24/2022    1:50 PM 06/14/2021    8:33 AM 06/08/2020    8:20 AM  Fall Risk   Falls in the past year? 0 0 1 0 0  Number falls in past yr: 0 0 0 0   Injury with Fall? 0 0 0 0   Risk for fall due to : Medication side effect No Fall Risks No Fall Risks No Fall Risks No Fall Risks  Follow up Falls prevention discussed;Education provided;Falls evaluation completed Falls evaluation completed Falls  evaluation completed  Education provided    FALL RISK PREVENTION PERTAINING TO THE HOME:  Any stairs in or around the home? Yes  If so, are there any without handrails? No  Home free of loose throw rugs in walkways, pet beds, electrical cords, etc? Yes  Adequate lighting in your home to reduce risk of falls? Yes   ASSISTIVE DEVICES UTILIZED TO PREVENT FALLS:  Life alert? No  Use of a cane, walker or  w/c? No  Grab bars in the bathroom? Yes  Shower chair or bench in shower? No  Elevated toilet seat or a handicapped toilet? No   TIMED UP AND GO:  Was the test performed? No .      Cognitive Function:        03/04/2023   10:18 AM 06/24/2022    1:53 PM  6CIT Screen  What Year? 0 points 0 points  What month? 0 points 0 points  What time? 0 points 0 points  Count back from 20 0 points 0 points  Months in reverse 0 points 0 points  Repeat phrase 0 points 0 points  Total Score 0 points 0 points    Immunizations Immunization History  Administered Date(s) Administered   COVID-19, mRNA, vaccine(Comirnaty)12 years and older 08/18/2022, 02/11/2023   DTaP 10/25/1996, 07/24/2004   Hepatitis A 07/24/2004, 10/03/2008   Influenza Split 07/31/2011, 10/05/2012, 08/02/2015   Influenza Whole 09/22/2007, 08/19/2008, 10/10/2009   Influenza, Quadrivalent, Recombinant, Inj, Pf 09/01/2018, 08/03/2019   Influenza,inj,Quad PF,6+ Mos 08/13/2013, 10/31/2014, 08/15/2017, 09/01/2020   Influenza-Unspecified 09/06/2016, 08/15/2017, 09/05/2019, 08/08/2021, 08/12/2022   Moderna Covid-19 Vaccine Bivalent Booster 5yrs & up 08/21/2021   PFIZER Comirnaty(Gray Top)Covid-19 Tri-Sucrose Vaccine 02/21/2021   PFIZER(Purple Top)SARS-COV-2 Vaccination 02/02/2020, 02/23/2020, 09/01/2020   PNEUMOCOCCAL CONJUGATE-20 06/24/2022   PPD Test 12/18/1997   Tdap 11/08/2013   Zoster Recombinat (Shingrix) 02/09/2018, 04/16/2018   Zoster, Live 08/02/2015    TDAP status: Up to date  Flu Vaccine status: Up to date  Pneumococcal vaccine status: Up to date  Covid-19 vaccine status: Completed vaccines  Qualifies for Shingles Vaccine? Yes   Zostavax completed Yes   Shingrix Completed?: Yes  Screening Tests Health Maintenance  Topic Date Due   COVID-19 Vaccine (8 - 2023-24 season) 04/08/2023   INFLUENZA VACCINE  06/19/2023   Medicare Annual Wellness (AWV)  06/25/2023   DTaP/Tdap/Td (4 - Td or Tdap) 11/09/2023    COLONOSCOPY (Pts 45-31yrs Insurance coverage will need to be confirmed)  02/01/2024   MAMMOGRAM  05/02/2024   Pneumonia Vaccine 10+ Years old  Completed   DEXA SCAN  Completed   Hepatitis C Screening  Completed   Zoster Vaccines- Shingrix  Completed   HPV VACCINES  Aged Out    Health Maintenance  There are no preventive care reminders to display for this patient.  Colorectal cancer screening: Type of screening: Colonoscopy. Completed 01/31/2017. Repeat every 7 years  Mammogram status: Completed 05/02/2022. Repeat every year  Bone Density status: Completed 08/20/2021.   Lung Cancer Screening: (Low Dose CT Chest recommended if Age 15-80 years, 30 pack-year currently smoking OR have quit w/in 15years.) does not qualify.   Lung Cancer Screening Referral: no  Additional Screening:  Hepatitis C Screening: does qualify; Completed 01/04/2016  Vision Screening: Recommended annual ophthalmology exams for early detection of glaucoma and other disorders of the eye. Is the patient up to date with their annual eye exam?  Yes  Who is the provider or what is the name of the office in which the  patient attends annual eye exams? [facility] If pt is not established with a provider, would they like to be referred to a provider to establish care? No .   Dental Screening: Recommended annual dental exams for proper oral hygiene  Community Resource Referral / Chronic Care Management: CRR required this visit?  No   CCM required this visit?  No      Plan:     I have personally reviewed and noted the following in the patient's chart:   Medical and social history Use of alcohol, tobacco or illicit drugs  Current medications and supplements including opioid prescriptions. Patient is not currently taking opioid prescriptions. Functional ability and status Nutritional status Physical activity Advanced directives List of other physicians Hospitalizations, surgeries, and ER visits in previous 12  months Vitals Screenings to include cognitive, depression, and falls Referrals and appointments  In addition, I have reviewed and discussed with patient certain preventive protocols, quality metrics, and best practice recommendations. A written personalized care plan for preventive services as well as general preventive health recommendations were provided to patient.     Barb Merino, LPN   1/61/0960   Nurse Notes: none  Due to this being a virtual visit, the after visit summary with patients personalized plan was offered to patient via mail or my-chart.  Patient would like to access on my-chart

## 2023-04-16 ENCOUNTER — Encounter: Payer: Self-pay | Admitting: Family Medicine

## 2023-04-17 ENCOUNTER — Ambulatory Visit (INDEPENDENT_AMBULATORY_CARE_PROVIDER_SITE_OTHER): Payer: PPO | Admitting: Family Medicine

## 2023-04-17 ENCOUNTER — Encounter: Payer: Self-pay | Admitting: Family Medicine

## 2023-04-17 VITALS — BP 130/80 | HR 80 | Ht 62.0 in | Wt 198.6 lb

## 2023-04-17 DIAGNOSIS — K625 Hemorrhage of anus and rectum: Secondary | ICD-10-CM

## 2023-04-17 DIAGNOSIS — K602 Anal fissure, unspecified: Secondary | ICD-10-CM | POA: Diagnosis not present

## 2023-04-17 DIAGNOSIS — K644 Residual hemorrhoidal skin tags: Secondary | ICD-10-CM

## 2023-04-17 DIAGNOSIS — K5900 Constipation, unspecified: Secondary | ICD-10-CM

## 2023-04-17 MED ORDER — HYDROCORTISONE (PERIANAL) 2.5 % EX CREA: 1.0000 | TOPICAL_CREAM | Freq: Two times a day (BID) | CUTANEOUS | 0 refills | Status: DC

## 2023-04-17 NOTE — Progress Notes (Signed)
Chief Complaint  Patient presents with   Hematuria    Blood in stool (on the outside) bright red. She was constipated and was straining. She had some popcorn with a day or so and she doesn't normally eat that. On [place] weightloss and achieved first goal and went off plan recently. (Change in diet).    On/off constipation all week.  She joined [place] weight loss.  Possibly having less water over the past week. She had popcorn for the first time in a while 2-3 days ago.  No abdominal pain or changes after eating this. Hadn't had a BM for 2 days.  Yesterday morning she was straining for a bowel movement.  She had small hard stools, with red blood on the outside.  No blood on the TP.  No associated pain with passing the bowel movement. Didn't feel like she emptied.  She didn't eat much the rest of the day.  Later that evening she had small BM (small balls), with some diarrhea as well.  No blood noted.  She had 2-3 times this morning where she passed small hard balls and diarrhea.  No blood.  Yesterday she had some abdominal pain, when her stomach was full/distended, across the whole lower abdomen. No abdominal pain today.   No associated nausea, vomiting. No fever, chills.  Never saw blood in toilet water, or TP, just on the outside of the stool yesterday morning.  Pt with hypothyroidism. Denies changes to hair/skin/bowels/energy/moods. Reports compliance with meds. Lab Results  Component Value Date   TSH 1.230 06/24/2022    PMH, PSH, SH  Divericulosis on colonoscopy; never had diverticulitis. Colonoscopy 01/2017 HTN, HLD, hypothyroidism.  H/o SVT s/p ablation   Outpatient Encounter Medications as of 04/17/2023  Medication Sig Note   atorvastatin (LIPITOR) 20 MG tablet Take 1 tablet (20 mg total) by mouth daily.    b complex vitamins capsule Take 1 capsule by mouth daily.    calcium carbonate (OSCAL) 1500 (600 Ca) MG TABS tablet Take 600 mg of elemental calcium by mouth daily.     Carboxymethylcellul-Glycerin (LUBRICATING EYE DROPS OP) Place 1 drop into both eyes daily as needed (dry eyes).    Cholecalciferol (VITAMIN D) 50 MCG (2000 UT) tablet Take 2,000 Units by mouth daily.    diclofenac sodium (VOLTAREN) 1 % GEL Apply 1 application topically 2 (two) times daily as needed (knee pain).  04/17/2023: Last used today   Glucosamine-Chondroit-Vit C-Mn (GLUCOSAMINE CHONDR 1500 COMPLX) CAPS Take 1 capsule by mouth daily.    lisinopril-hydrochlorothiazide (ZESTORETIC) 20-12.5 MG tablet Take 1 tablet by mouth daily.    Magnesium 400 MG CAPS Take 400 mg by mouth at bedtime as needed (muscle relaxation).    Multiple Vitamins-Minerals (WOMENS 50+ MULTI VITAMIN/MIN) TABS Take 1 tablet by mouth daily.     Potassium 99 MG TABS Take 99 mg by mouth at bedtime as needed (muscle relaxation).    SYNTHROID 125 MCG tablet Take 1 tablet (125 mcg total) by mouth daily.    ALPRAZolam (XANAX) 0.25 MG tablet Take 1 tablet (0.25 mg total) by mouth 2 (two) times daily as needed for anxiety. (Patient not taking: Reported on 04/17/2023) 04/17/2023: As needed   Eszopiclone 3 MG TABS TAKE 1 TABLET BY MOUTH IMMEDIATELY AT BEDTIME AS NEEDED (Patient not taking: Reported on 04/17/2023) 04/17/2023: As needed   fluticasone (FLONASE) 50 MCG/ACT nasal spray Place 1 spray into both nostrils daily as needed for allergies or rhinitis. (Patient not taking: Reported on 04/17/2023) 04/17/2023: As  needed, seasonally   loratadine (CLARITIN) 10 MG tablet Take 10 mg by mouth daily. (Patient not taking: Reported on 04/17/2023) 04/17/2023: As needed, seasonally   valACYclovir (VALTREX) 1000 MG tablet TAKE TWO TABLETS BY MOUTH TWICE A DAY FOR 1 DAY , THEN AS DIRECTED BY YOUR DOCTOR (Patient not taking: Reported on 04/17/2023) 04/17/2023: As needed for cold sores   [DISCONTINUED] amoxicillin-clavulanate (AUGMENTIN) 875-125 MG tablet Take 1 tablet by mouth 2 (two) times daily.    [DISCONTINUED] benzonatate (TESSALON) 200 MG capsule Take 1  capsule (200 mg total) by mouth 2 (two) times daily as needed for cough.    No facility-administered encounter medications on file as of 04/17/2023.   Also uses psyllium 3-4 times/week.  Allergies  Allergen Reactions   Codeine Itching   Hydrocodone Itching    ROS: no fever, chills, URI symptoms, headaches, dizziness, chest pain, palpitations/tachycardia.  No n/v, abdominal pain (just slight yesterday). Bowel changes and blood in stool per HPI.  No heartburn. No changes to hair/skin/bowels. Moods are good. +intentional weight loss   PHYSICAL EXAM:  BP 130/80   Pulse 80   Ht 5\' 2"  (1.575 m)   Wt 198 lb 9.6 oz (90.1 kg)   LMP 11/18/2010   BMI 36.32 kg/m   Wt Readings from Last 3 Encounters:  04/17/23 198 lb 9.6 oz (90.1 kg)  03/04/23 197 lb (89.4 kg)  12/11/22 220 lb 9.6 oz (100.1 kg)   Pleasant, well-appearing female in no distress HEENT: conjunctiva and sclera are clear, EOMI Neck: no lymphadenopathy, thyromegaly or mass Heart: regular rate and rhythm Lungs: clear bilaterally Back: no spinal or CVA tenderness Abdomen: obese, soft, normal bowel sounds.  Nontender, no organomegaly or mass Extremities: no edema. She had some R leg cramps when laying on left side with R knee flexed. Skin: normal turgor. 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 No erythema, crusting. Rectal: mildly inflamed, nonbleeding, non-thrombosed hemorrhoid.  2 areas that have very shallow ulceration in linear form, consistent with small fissures.  No active bleeding. Anoscopy:  small amount of hard stool noted.  No inflamed or bleeding internal hemorrhoids. Stool is heme negative Psych: normal mood, affect, hygiene and grooming Neuro: alert and oriented, cranial nerves grossly intact, normal gait.    ASSESSMENT/PLAN   External hemorrhoid - Plan: hydrocortisone (ANUSOL-HC) 2.5 % rectal cream  Anal fissure  Rectal bleeding - no significant bleeding. Ddx  reviewed.  Exam findings c/w external hemorrhoid (nonbleeding), fissures.  stool heme negative. Reassured  Constipation, unspecified constipation type - cont hydration, high fiber diet, encouraged regular exercise. Consider stool softeners prn. No sx to suggest thyroid issue.   I spent 38 minutes dedicated to the care of this patient, including pre-visit review of records, face to face time, post-visit ordering of testing and documentation.

## 2023-04-17 NOTE — Patient Instructions (Addendum)
On exam you had an inflamed external hemorrhoid and 2 small anal fissures. These are the source of your bleeding.  On the internal exam that was done, there was no evidence of any bleeding.  Stay well hydrated, high fiber diet, and regular exercise to help keep your bowels normal. Use the anusol HC cream as needed for hemorrhoidal inflammation (discomfort, swelling, itching).  If you are having hard stools, consider taking colace (docusate sodium), a stool softener.  Return for re-evaluation if you develop persistent/recurrent rectal bleeding, abdominal pain, fever, or other new concerns.

## 2023-05-02 ENCOUNTER — Other Ambulatory Visit: Payer: Self-pay | Admitting: Family Medicine

## 2023-05-08 DIAGNOSIS — M170 Bilateral primary osteoarthritis of knee: Secondary | ICD-10-CM | POA: Diagnosis not present

## 2023-05-08 DIAGNOSIS — Z1231 Encounter for screening mammogram for malignant neoplasm of breast: Secondary | ICD-10-CM | POA: Diagnosis not present

## 2023-05-08 LAB — HM MAMMOGRAPHY

## 2023-05-26 DIAGNOSIS — D1801 Hemangioma of skin and subcutaneous tissue: Secondary | ICD-10-CM | POA: Diagnosis not present

## 2023-05-26 DIAGNOSIS — L821 Other seborrheic keratosis: Secondary | ICD-10-CM | POA: Diagnosis not present

## 2023-05-26 DIAGNOSIS — D229 Melanocytic nevi, unspecified: Secondary | ICD-10-CM | POA: Diagnosis not present

## 2023-05-26 DIAGNOSIS — L814 Other melanin hyperpigmentation: Secondary | ICD-10-CM | POA: Diagnosis not present

## 2023-05-26 DIAGNOSIS — L578 Other skin changes due to chronic exposure to nonionizing radiation: Secondary | ICD-10-CM | POA: Diagnosis not present

## 2023-06-26 ENCOUNTER — Encounter: Payer: Self-pay | Admitting: Family Medicine

## 2023-06-26 ENCOUNTER — Ambulatory Visit (INDEPENDENT_AMBULATORY_CARE_PROVIDER_SITE_OTHER): Payer: PPO | Admitting: Family Medicine

## 2023-06-26 ENCOUNTER — Other Ambulatory Visit: Payer: Self-pay | Admitting: Family Medicine

## 2023-06-26 VITALS — BP 126/82 | HR 68 | Ht 61.75 in | Wt 204.2 lb

## 2023-06-26 DIAGNOSIS — E669 Obesity, unspecified: Secondary | ICD-10-CM | POA: Diagnosis not present

## 2023-06-26 DIAGNOSIS — I10 Essential (primary) hypertension: Secondary | ICD-10-CM | POA: Diagnosis not present

## 2023-06-26 DIAGNOSIS — E785 Hyperlipidemia, unspecified: Secondary | ICD-10-CM

## 2023-06-26 DIAGNOSIS — I700 Atherosclerosis of aorta: Secondary | ICD-10-CM | POA: Diagnosis not present

## 2023-06-26 DIAGNOSIS — Z6838 Body mass index (BMI) 38.0-38.9, adult: Secondary | ICD-10-CM

## 2023-06-26 DIAGNOSIS — M8580 Other specified disorders of bone density and structure, unspecified site: Secondary | ICD-10-CM | POA: Diagnosis not present

## 2023-06-26 DIAGNOSIS — E039 Hypothyroidism, unspecified: Secondary | ICD-10-CM

## 2023-06-26 DIAGNOSIS — F419 Anxiety disorder, unspecified: Secondary | ICD-10-CM

## 2023-06-26 DIAGNOSIS — Z86010 Personal history of colonic polyps: Secondary | ICD-10-CM | POA: Diagnosis not present

## 2023-06-26 DIAGNOSIS — I471 Supraventricular tachycardia, unspecified: Secondary | ICD-10-CM | POA: Diagnosis not present

## 2023-06-26 DIAGNOSIS — J309 Allergic rhinitis, unspecified: Secondary | ICD-10-CM | POA: Diagnosis not present

## 2023-06-26 DIAGNOSIS — L719 Rosacea, unspecified: Secondary | ICD-10-CM

## 2023-06-26 DIAGNOSIS — M1990 Unspecified osteoarthritis, unspecified site: Secondary | ICD-10-CM | POA: Diagnosis not present

## 2023-06-26 LAB — COMPREHENSIVE METABOLIC PANEL
Chloride: 101 mmol/L (ref 96–106)
Potassium: 3.9 mmol/L (ref 3.5–5.2)
Sodium: 138 mmol/L (ref 134–144)
Total Protein: 6.6 g/dL (ref 6.0–8.5)

## 2023-06-26 LAB — CBC WITH DIFFERENTIAL/PLATELET
Basophils Absolute: 0.1 10*3/uL (ref 0.0–0.2)
Basos: 1 %
EOS (ABSOLUTE): 0.2 10*3/uL (ref 0.0–0.4)
Eos: 2 %
Hematocrit: 41.4 % (ref 34.0–46.6)
Hemoglobin: 14 g/dL (ref 11.1–15.9)
Immature Grans (Abs): 0 10*3/uL (ref 0.0–0.1)
Immature Granulocytes: 0 %
Lymphocytes Absolute: 1.8 10*3/uL (ref 0.7–3.1)
Lymphs: 25 %
MCH: 30.1 pg (ref 26.6–33.0)
MCHC: 33.8 g/dL (ref 31.5–35.7)
MCV: 89 fL (ref 79–97)
Monocytes Absolute: 0.5 10*3/uL (ref 0.1–0.9)
Monocytes: 8 %
Neutrophils Absolute: 4.6 10*3/uL (ref 1.4–7.0)
Neutrophils: 64 %
Platelets: 293 10*3/uL (ref 150–450)
RBC: 4.65 x10E6/uL (ref 3.77–5.28)
RDW: 12.3 % (ref 11.7–15.4)
WBC: 7.2 10*3/uL (ref 3.4–10.8)

## 2023-06-26 LAB — LIPID PANEL

## 2023-06-26 LAB — TSH

## 2023-06-26 MED ORDER — ATORVASTATIN CALCIUM 20 MG PO TABS
20.0000 mg | ORAL_TABLET | Freq: Every day | ORAL | 3 refills | Status: DC
Start: 2023-06-26 — End: 2024-06-24

## 2023-06-26 MED ORDER — LISINOPRIL-HYDROCHLOROTHIAZIDE 20-12.5 MG PO TABS
1.0000 | ORAL_TABLET | Freq: Every day | ORAL | 3 refills | Status: DC
Start: 2023-06-26 — End: 2024-06-24

## 2023-06-26 MED ORDER — SYNTHROID 125 MCG PO TABS: 125.0000 ug | ORAL_TABLET | Freq: Every day | ORAL | 3 refills | Status: DC

## 2023-06-26 NOTE — Progress Notes (Signed)
Paula Cline is a 67 y.o. female who presents for annual wellness visit and follow-up on chronic medical conditions.  She has no particular concerns or complaints.  She does have history of colonic polyps and is scheduled for routine follow-up on that.  She continues on Lipitor, lisinopril/HCTZ and Synthroid and having no difficulty with them.  She does have underlying allergies and uses OTC meds for that.  She does have osteopenia and will need follow-up with DEXA scan.  Arthritis causes very little difficulty.  She does have a previous history of SVT.  Immunizations and Health Maintenance Immunization History  Administered Date(s) Administered   COVID-19, mRNA, vaccine(Comirnaty)12 years and older 08/18/2022, 02/11/2023   DTaP 10/25/1996, 07/24/2004   Hepatitis A 07/24/2004, 10/03/2008   Influenza Split 07/31/2011, 10/05/2012, 08/02/2015   Influenza Whole 09/22/2007, 08/19/2008, 10/10/2009   Influenza, Quadrivalent, Recombinant, Inj, Pf 09/01/2018, 08/03/2019   Influenza,inj,Quad PF,6+ Mos 08/13/2013, 10/31/2014, 08/15/2017, 09/01/2020   Influenza-Unspecified 09/06/2016, 08/15/2017, 09/05/2019, 08/08/2021, 08/12/2022   Moderna Covid-19 Vaccine Bivalent Booster 29yrs & up 08/21/2021   PFIZER Comirnaty(Gray Top)Covid-19 Tri-Sucrose Vaccine 02/21/2021   PFIZER(Purple Top)SARS-COV-2 Vaccination 02/02/2020, 02/23/2020, 09/01/2020   PNEUMOCOCCAL CONJUGATE-20 06/24/2022   PPD Test 12/18/1997   Tdap 11/08/2013   Zoster Recombinant(Shingrix) 02/09/2018, 04/16/2018   Zoster, Live 08/02/2015   Health Maintenance Due  Topic Date Due   COVID-19 Vaccine (8 - 2023-24 season) 04/08/2023   INFLUENZA VACCINE  06/19/2023    Last Pap smear: 2023 Last mammogram: 05/08/23 Last colonoscopy: 2018 due next year Last DEXA:08/20/21 Dentist: 07/24 Ophtho: next week Exercise: joined [employer]  Other doctors caring for patient include: Ortho Dr. Ranell Patrick or his PA.   Advanced directives:none     Depression screen:  See questionnaire below.     06/26/2023    8:16 AM 03/04/2023   10:17 AM 06/24/2022    1:50 PM 06/14/2021    8:31 AM 06/08/2020    8:20 AM  Depression screen PHQ 2/9  Decreased Interest 0 0 0 0 0  Down, Depressed, Hopeless 0 0 0 0 0  PHQ - 2 Score 0 0 0 0 0  Altered sleeping  0     Tired, decreased energy  0     Change in appetite  0     Feeling bad or failure about yourself   0     Trouble concentrating  0     Moving slowly or fidgety/restless  0     Suicidal thoughts  0     PHQ-9 Score  0     Difficult doing work/chores  Not difficult at all       Fall Risk Screen: see questionnaire below.    06/26/2023    8:16 AM 03/04/2023    8:44 AM 12/03/2022    3:49 PM 06/24/2022    1:50 PM 06/14/2021    8:33 AM  Fall Risk   Falls in the past year? 0 0 0 1 0  Number falls in past yr: 0 0 0 0 0  Injury with Fall? 0 0 0 0 0  Risk for fall due to : No Fall Risks Medication side effect No Fall Risks No Fall Risks No Fall Risks  Follow up Falls evaluation completed Falls prevention discussed;Education provided;Falls evaluation completed Falls evaluation completed Falls evaluation completed     ADL screen:  See questionnaire below Functional Status Survey:     Review of Systems Constitutional: -, -unexpected weight change, -anorexia, -fatigue Allergy: -sneezing, -itching, -congestion Dermatology: denies changing  moles, rash, lumps ENT: -runny nose, -ear pain, -sore throat,  Cardiology:  -chest pain, -palpitations, -orthopnea, Respiratory: -cough, -shortness of breath, -dyspnea on exertion, -wheezing,  Gastroenterology: -abdominal pain, -nausea, -vomiting, -diarrhea, -constipation, -dysphagia Hematology: -bleeding or bruising problems Musculoskeletal: -arthralgias, -myalgias, -joint swelling, -back pain, - Ophthalmology: -vision changes,  Urology: -dysuria, -difficulty urinating,  -urinary frequency, -urgency, incontinence Neurology: -, -numbness, , -memory loss,  -falls, -dizziness    PHYSICAL EXAM:  BP 126/82   Pulse 68   Ht 5' 1.75" (1.568 m)   Wt 204 lb 3.2 oz (92.6 kg)   LMP 11/18/2010   BMI 37.65 kg/m   General Appearance: Alert, cooperative, no distress, appears stated age Head: Normocephalic, without obvious abnormality, atraumatic Eyes: PERRL, conjunctiva/corneas clear, EOM's intact, Ears: Normal TM's and external ear canals Nose: Nares normal, mucosa normal, no drainage or sinus tenderness Throat: Lips, mucosa, and tongue normal; teeth and gums normal Neck: Supple, no lymphadenopathy;  thyroid:  no enlargement/tenderness/nodules; no carotid bruit or JVD Lungs: Clear to auscultation bilaterally without wheezes, rales or ronchi; respirations unlabored Heart: Regular rate and rhythm, S1 and S2 normal, no murmur, rubor gallop Abdomen: Soft, non-tender, nondistended, normoactive bowel sounds,  no masses, no hepatosplenomegaly Extremities: No clubbing, cyanosis or edema Pulses: 2+ and symmetric all extremities Skin:  Skin color, texture, turgor normal, no rashes or lesions Lymph nodes: Cervical, supraclavicular, and axillary nodes normal Neurologic:  CNII-XII intact, normal strength, sensation and gait; reflexes 2+ and symmetric throughout Psych: Normal mood, affect, hygiene and grooming.  ASSESSMENT/PLAN:    Discussed  at least 30 minutes of aerobic activity at least 5 days/week and weight-bearing exercise 2x/week;   Immunization recommendations discussed.  Recommend Tdap, RSV, flu and COVID.  Colonoscopy recommendations reviewed   Medicare Attestation I have personally reviewed: The patient's medical and social history Their use of alcohol, tobacco or illicit drugs Their current medications and supplements The patient's functional ability including ADLs,fall risks, home safety risks, cognitive, and hearing and visual impairment Diet and physical activities Evidence for depression or mood disorders  The patient's weight,  height, and BMI have been recorded in the chart.  I have made referrals, counseling, and provided education to the patient based on review of the above and I have provided the patient with a written personalized care plan for preventive services.     Sharlot Gowda, MD   06/26/2023

## 2023-06-27 ENCOUNTER — Encounter: Payer: Self-pay | Admitting: Family Medicine

## 2023-06-27 NOTE — Addendum Note (Signed)
Addended by: Ronnald Nian on: 06/27/2023 08:15 AM   Modules accepted: Orders

## 2023-06-30 DIAGNOSIS — H43811 Vitreous degeneration, right eye: Secondary | ICD-10-CM | POA: Diagnosis not present

## 2023-07-30 DIAGNOSIS — M170 Bilateral primary osteoarthritis of knee: Secondary | ICD-10-CM | POA: Diagnosis not present

## 2023-07-31 ENCOUNTER — Other Ambulatory Visit: Payer: Self-pay | Admitting: Family Medicine

## 2023-07-31 ENCOUNTER — Other Ambulatory Visit: Payer: PPO

## 2023-07-31 DIAGNOSIS — E039 Hypothyroidism, unspecified: Secondary | ICD-10-CM

## 2023-07-31 DIAGNOSIS — F419 Anxiety disorder, unspecified: Secondary | ICD-10-CM

## 2023-08-01 LAB — TSH: TSH: 0.239 u[IU]/mL — ABNORMAL LOW (ref 0.450–4.500)

## 2023-08-01 MED ORDER — LEVOTHYROXINE SODIUM 112 MCG PO TABS: 112.0000 ug | ORAL_TABLET | Freq: Every day | ORAL | 3 refills | Status: DC

## 2023-08-06 DIAGNOSIS — M170 Bilateral primary osteoarthritis of knee: Secondary | ICD-10-CM | POA: Diagnosis not present

## 2023-08-13 DIAGNOSIS — H0015 Chalazion left lower eyelid: Secondary | ICD-10-CM | POA: Diagnosis not present

## 2023-08-13 DIAGNOSIS — M170 Bilateral primary osteoarthritis of knee: Secondary | ICD-10-CM | POA: Diagnosis not present

## 2023-08-29 ENCOUNTER — Encounter: Payer: Self-pay | Admitting: Family Medicine

## 2023-09-01 MED ORDER — CELECOXIB 200 MG PO CAPS: 200.0000 mg | ORAL_CAPSULE | Freq: Two times a day (BID) | ORAL | 1 refills | Status: DC

## 2023-10-15 DIAGNOSIS — M170 Bilateral primary osteoarthritis of knee: Secondary | ICD-10-CM | POA: Diagnosis not present

## 2023-10-28 ENCOUNTER — Other Ambulatory Visit: Payer: Self-pay | Admitting: Family Medicine

## 2023-10-28 NOTE — Telephone Encounter (Signed)
Is this okay to refill? 

## 2023-10-29 ENCOUNTER — Other Ambulatory Visit: Payer: Self-pay | Admitting: Family Medicine

## 2023-12-17 ENCOUNTER — Encounter: Payer: Self-pay | Admitting: Gastroenterology

## 2023-12-29 DIAGNOSIS — M25562 Pain in left knee: Secondary | ICD-10-CM | POA: Diagnosis not present

## 2023-12-29 DIAGNOSIS — M25561 Pain in right knee: Secondary | ICD-10-CM | POA: Diagnosis not present

## 2024-01-06 ENCOUNTER — Encounter: Payer: Self-pay | Admitting: Family Medicine

## 2024-01-06 ENCOUNTER — Ambulatory Visit (INDEPENDENT_AMBULATORY_CARE_PROVIDER_SITE_OTHER): Payer: PPO | Admitting: Family Medicine

## 2024-01-06 VITALS — BP 120/70 | HR 83 | Ht 61.0 in | Wt 212.6 lb

## 2024-01-06 DIAGNOSIS — I471 Supraventricular tachycardia, unspecified: Secondary | ICD-10-CM | POA: Diagnosis not present

## 2024-01-06 DIAGNOSIS — Z01818 Encounter for other preprocedural examination: Secondary | ICD-10-CM

## 2024-01-06 DIAGNOSIS — E039 Hypothyroidism, unspecified: Secondary | ICD-10-CM

## 2024-01-06 NOTE — Progress Notes (Signed)
   Subjective:    Patient ID: Paula Cline, female    DOB: 10-18-1956, 68 y.o.   MRN: [id number]  HPI She is here for preoperative evaluation.  She is getting ready to have a left TKR.  She is taking Celebrex to help with her discomfort.  Review the record indicates she has a previous history of SVT with an ablation back in 2021.  She states that she did have 1 episode within the last month or so where she felt her heart rate increased but this went away fairly quickly.  There was no Sriyesh or any chest pain, shortness of breath PND or DOE.  She continues on Synthroid as well as Lipitor and lisinopril.  Previous TSH was low and her thyroid was readjusted at a lower level.  Otherwise she has no concerns or complaints.   Review of Systems     Objective:    Physical Exam Alert and in no distress. Tympanic membranes and canals are normal. Pharyngeal area is normal. Neck is supple without adenopathy or thyromegaly. Cardiac exam shows a regular sinus rhythm without murmurs or gallops. Lungs are clear to auscultation. EKG read by me shows a rate of 67 with other parameters being totally normal.       Assessment & Plan:  Preoperative evaluation to rule out surgical contraindication - Plan: EKG 12-Lead, Ambulatory referral to Cardiology  Hypothyroidism, unspecified type - Plan: TSH  SVT (supraventricular tachycardia) (HCC) Cleared for surgery pending cardiology evaluation.

## 2024-01-07 ENCOUNTER — Encounter: Payer: Self-pay | Admitting: Family Medicine

## 2024-01-07 LAB — TSH: TSH: 1.28 u[IU]/mL (ref 0.450–4.500)

## 2024-01-13 ENCOUNTER — Encounter: Payer: Self-pay | Admitting: Internal Medicine

## 2024-01-16 ENCOUNTER — Telehealth: Payer: Self-pay | Admitting: *Deleted

## 2024-01-16 NOTE — Telephone Encounter (Signed)
   Pre-operative Risk Assessment    Patient Name: Paula Cline  DOB: [birth year] MRN: [id number]   Date of last office visit: 11/28/21 Francis Dowse, PAC Date of next office visit: 01/28/24 Francis Dowse, [facility]   Request for Surgical Clearance    Procedure:   LEFT TOTAL KNEE ARTHROPLASTY  Date of Surgery:  Clearance TBD                                Surgeon:  DR. Malon Kindle Surgeon's Group or Practice Name:  [facility] Phone number:  [phone] KERRI MAZE Fax number:  [phone]   Type of Clearance Requested:   - Medical ; NONE INDICATED TO BE HELD   Type of Anesthesia:  Spinal   Additional requests/questions:    Elpidio Anis   01/16/2024, 5:34 PM

## 2024-01-19 ENCOUNTER — Ambulatory Visit (AMBULATORY_SURGERY_CENTER): Payer: PPO

## 2024-01-19 VITALS — Ht 61.0 in | Wt 216.0 lb

## 2024-01-19 DIAGNOSIS — Z860100 Personal history of colon polyps, unspecified: Secondary | ICD-10-CM

## 2024-01-19 MED ORDER — SUFLAVE 178.7 G PO SOLR: 1.0000 | Freq: Once | ORAL | 0 refills | Status: AC

## 2024-01-19 NOTE — Telephone Encounter (Signed)
   Name: Paula Cline  DOB: January 15, 1956  MRN: [id number]  Primary Cardiologist: Chilton Si, MD  Chart reviewed as part of pre-operative protocol coverage. The patient has an upcoming visit scheduled with Francis Dowse PA-C on 01/28/24 at which time clearance can be addressed in case there are any issues that would impact surgical recommendations.  Surgery date is currently TBD as below. I added preop FYI to appointment note so that provider is aware to address at time of outpatient visit.  Per office protocol the cardiology provider should forward their finalized clearance decision and recommendations regarding antiplatelet therapy to the requesting party below.    Request for Surgical Clearance    Procedure:   LEFT TOTAL KNEE ARTHROPLASTY   Date of Surgery:  Clearance TBD                                  Surgeon:  DR. Malon Kindle Surgeon's Group or Practice Name:  [facility] Phone number:  [phone] KERRI MAZE Fax number:  [phone]   Type of Clearance Requested:   - Medical ; NONE INDICATED TO BE HELD   Type of Anesthesia:  Spinal  I will route this message as FYI to requesting party and remove this message from the preop box as separate preop APP input not needed at this time.   Please call with any questions.  Perlie Gold, PA-C  01/19/2024, 9:01 AM

## 2024-01-19 NOTE — Progress Notes (Signed)

## 2024-01-25 NOTE — Progress Notes (Unsigned)
 Cardiology Office Note:  .   Date:  01/25/2024  ID:  Paula Cline, DOB [birth year], MRN [id number] PCP: Paula Nian, MD  [facility] Providers Cardiologist:  Paula Si, MD { EP: Dr. Elberta Cline  History of Present Illness: .   Paula Cline is a 68 y.o. female w/PMHx of  HTN, HLD, hypothyroidism SVT  Saw Dr. Elberta Cline 04/18/20, she was s/p SVT ablation (03/01/20), doing well though somewhat fatigued and her metoprolol was stopped planned to f/u PRN   I have seen her a few times over the years Last 11/28/21 Struggling with knee trouble, reported occ after meals a feeling of elevated HRs though no cardiac concerns, symptoms otherwise  Today's visit is scheduled as a pre-op evaluation visit pending L knee surgery  RCRI is zero, (0.4%)  ROS:   Despite her knees, she has been able to stay very active Goes to [employer] regularly, uses exercise bike and does some weight training as well, tends to her home, yard, all with good/unchanged exertional capacity No CP, SOB, DOE Rare awareness of a faster HR again after she has eaten a large meal and laid down quickly afterwards No dizzy spells, near syncope or syncope   Studies Reviewed: Marland Kitchen    EKG done today and reviewed by myself:  SR 75bpm, normal   03/01/2020: EPS/Ablation CONCLUSIONS:  1. Sinus rhythm upon presentation.  2. The patient had dual AV nodal physiology with easily inducible classic AV nodal reentrant tachycardia, there were no other accessory pathways or arrhythmias induced  3. Successful radiofrequency modification of the slow AV nodal pathway  4. No inducible arrhythmias following ablation.  5. No early apparent complications.      TTE 10/27/19  Review of the above records today demonstrates:   1. Left ventricular ejection fraction, by visual estimation, is 60 to 65%. The left ventricle has normal function. There is no left ventricular hypertrophy.  2. The left ventricle has no regional wall motion  abnormalities.  3. Global right ventricle has normal systolic function.The right ventricular size is normal. No increase in right ventricular wall thickness.  4. Left atrial size was normal.  5. Right atrial size was normal.  6. Presence of pericardial fat pad.  7. Trivial pericardial effusion is present.  8. The mitral valve is grossly normal. Trivial mitral valve regurgitation.  9. The tricuspid valve is grossly normal. Tricuspid valve regurgitation is trivial. 10. The aortic valve is tricuspid. Aortic valve regurgitation is not visualized. No evidence of aortic valve sclerosis or stenosis. 11. The pulmonic valve was grossly normal. Pulmonic valve regurgitation is trivial. 12. Normal pulmonary artery systolic pressure. 13. The tricuspid regurgitant velocity is 2.28 m/s, and with an assumed right atrial pressure of 3 mmHg, the estimated right ventricular systolic pressure is normal at 23.7 mmHg. 14. The inferior vena cava is normal in size with greater than 50% respiratory variability, suggesting right atrial pressure of 3 mmHg. 15. No prior Echocardiogram.   Risk Assessment/Calculations:    Physical Exam:   VS:  LMP 11/18/2010    Wt Readings from Last 3 Encounters:  01/19/24 216 lb (98 kg)  01/06/24 212 lb 9.6 oz (96.4 kg)  06/26/23 204 lb 3.2 oz (92.6 kg)    GEN: Well nourished, well developed in no acute distress NECK: No JVD; No carotid bruits CARDIAC: RRR, no murmurs, rubs, gallops RESPIRATORY:  CTA b/l without rales, wheezing or rhonchi  ABDOMEN: Soft, non-tender, non-distended EXTREMITIES: No edema; No deformity  ASSESSMENT AND PLAN: .    SVT AVNRT ablated 2021 No symptoms to suggest recurrent arrhythmia  HTN No changes C/w her PMD  Pre-op cardiac evaluation Low cardiac risk surgery Low cardiac risk score Excellent exertional capacity, exercising regularly No cardiac contraindications to knee surgery   Dispo: we can continue to see her PRN, or every couple  years to stay active in the practice  Signed, Paula Pigeon, PA-C

## 2024-01-26 ENCOUNTER — Other Ambulatory Visit: Payer: Self-pay | Admitting: Family Medicine

## 2024-01-26 DIAGNOSIS — F419 Anxiety disorder, unspecified: Secondary | ICD-10-CM

## 2024-01-26 NOTE — Telephone Encounter (Signed)
 Is this okay to refill?

## 2024-01-27 ENCOUNTER — Other Ambulatory Visit: Payer: Self-pay | Admitting: Family Medicine

## 2024-01-27 ENCOUNTER — Encounter: Payer: Self-pay | Admitting: Gastroenterology

## 2024-01-27 DIAGNOSIS — B001 Herpesviral vesicular dermatitis: Secondary | ICD-10-CM

## 2024-01-27 NOTE — Telephone Encounter (Signed)
 Is this okay to refill?

## 2024-01-28 ENCOUNTER — Ambulatory Visit: Payer: PPO | Attending: Physician Assistant | Admitting: Physician Assistant

## 2024-01-28 VITALS — BP 140/80 | HR 75 | Ht 61.0 in | Wt 222.4 lb

## 2024-01-28 DIAGNOSIS — I10 Essential (primary) hypertension: Secondary | ICD-10-CM

## 2024-01-28 DIAGNOSIS — I471 Supraventricular tachycardia, unspecified: Secondary | ICD-10-CM | POA: Diagnosis not present

## 2024-01-28 DIAGNOSIS — Z01818 Encounter for other preprocedural examination: Secondary | ICD-10-CM

## 2024-01-28 NOTE — Patient Instructions (Signed)
Medication Instructions:    Your physician recommends that you continue on your current medications as directed. Please refer to the Current Medication list given to you today.   *If you need a refill on your cardiac medications before your next appointment, please call your pharmacy*   Lab Work: NONE ORDERED  TODAY    If you have labs (blood work) drawn today and your tests are completely normal, you will receive your results only by: MyChart Message (if you have MyChart) OR A paper copy in the mail If you have any lab test that is abnormal or we need to change your treatment, we will call you to review the results.   Testing/Procedures: NONE ORDERED  TODAY   Follow-Up: At [facility], you and your health needs are our priority.  As part of our continuing mission to provide you with exceptional heart care, we have created designated Provider Care Teams.  These Care Teams include your primary Cardiologist (physician) and Advanced Practice Providers (APPs -  Physician Assistants and Nurse Practitioners) who all work together to provide you with the care you need, when you need it.  We recommend signing up for the patient portal called "MyChart".  Sign up information is provided on this After Visit Summary.  MyChart is used to connect with patients for Virtual Visits (Telemedicine).  Patients are able to view lab/test results, encounter notes, upcoming appointments, etc.  Non-urgent messages can be sent to your provider as well.   To learn more about what you can do with MyChart, go to [email or web address].    Your next appointment:  CONTACT [facility] [phone] AS NEEDED FOR  ANY CARDIAC RELATED SYMPTOMS   Other Instructions  

## 2024-02-02 ENCOUNTER — Ambulatory Visit: Payer: PPO | Admitting: Gastroenterology

## 2024-02-02 ENCOUNTER — Encounter: Payer: Self-pay | Admitting: Gastroenterology

## 2024-02-02 VITALS — BP 171/91 | HR 62 | Temp 97.7°F | Resp 9 | Ht 61.0 in | Wt 216.0 lb

## 2024-02-02 DIAGNOSIS — K644 Residual hemorrhoidal skin tags: Secondary | ICD-10-CM | POA: Diagnosis not present

## 2024-02-02 DIAGNOSIS — K635 Polyp of colon: Secondary | ICD-10-CM | POA: Diagnosis not present

## 2024-02-02 DIAGNOSIS — Z860101 Personal history of adenomatous and serrated colon polyps: Secondary | ICD-10-CM | POA: Diagnosis not present

## 2024-02-02 DIAGNOSIS — K621 Rectal polyp: Secondary | ICD-10-CM

## 2024-02-02 DIAGNOSIS — I471 Supraventricular tachycardia, unspecified: Secondary | ICD-10-CM | POA: Diagnosis not present

## 2024-02-02 DIAGNOSIS — K5730 Diverticulosis of large intestine without perforation or abscess without bleeding: Secondary | ICD-10-CM | POA: Diagnosis not present

## 2024-02-02 DIAGNOSIS — K648 Other hemorrhoids: Secondary | ICD-10-CM

## 2024-02-02 DIAGNOSIS — D122 Benign neoplasm of ascending colon: Secondary | ICD-10-CM

## 2024-02-02 DIAGNOSIS — I10 Essential (primary) hypertension: Secondary | ICD-10-CM | POA: Diagnosis not present

## 2024-02-02 DIAGNOSIS — E669 Obesity, unspecified: Secondary | ICD-10-CM | POA: Diagnosis not present

## 2024-02-02 DIAGNOSIS — Z1211 Encounter for screening for malignant neoplasm of colon: Secondary | ICD-10-CM

## 2024-02-02 DIAGNOSIS — Z860100 Personal history of colon polyps, unspecified: Secondary | ICD-10-CM

## 2024-02-02 MED ORDER — SODIUM CHLORIDE 0.9 % IV SOLN: 500.0000 mL | INTRAVENOUS | Status: DC

## 2024-02-02 NOTE — Op Note (Signed)
 [facility] Patient Name: Paula Cline Procedure Date: 02/02/2024 12:52 PM MRN: [id number] Endoscopist: Napoleon Form , MD, [id number] Age: 68 Referring MD:  Date of Birth: 07/06/1956 Gender: Female Account #: [id number] Procedure:                Colonoscopy Indications:              High risk colon cancer surveillance: Personal                            history of adenoma less than 10 mm in size Medicines:                Monitored Anesthesia Care Procedure:                Pre-Anesthesia Assessment:                           - Prior to the procedure, a History and Physical                            was performed, and patient medications and                            allergies were reviewed. The patient's tolerance of                            previous anesthesia was also reviewed. The risks                            and benefits of the procedure and the sedation                            options and risks were discussed with the patient.                            All questions were answered, and informed consent                            was obtained. Prior Anticoagulants: The patient has                            taken no anticoagulant or antiplatelet agents. ASA                            Grade Assessment: II - A patient with mild systemic                            disease. After reviewing the risks and benefits,                            the patient was deemed in satisfactory condition to                            undergo the procedure.  After obtaining informed consent, the colonoscope                            was passed under direct vision. Throughout the                            procedure, the patient's blood pressure, pulse, and                            oxygen saturations were monitored continuously. The                            [id number]-HQ190L Colonoscope [id number] was introduced                            through the anus  and advanced to the the cecum,                            identified by appendiceal orifice and ileocecal                            valve. The colonoscopy was performed without                            difficulty. The patient tolerated the procedure                            well. The quality of the bowel preparation was                            adequate. The ileocecal valve, appendiceal orifice,                            and rectum were photographed. Scope In: 1:09:00 PM Scope Out: 1:25:09 PM Scope Withdrawal Time: 0 hours 12 minutes 3 seconds  Total Procedure Duration: 0 hours 16 minutes 9 seconds  Findings:                 The perianal and digital rectal examinations were                            normal.                           A 1 mm polyp was found in the ascending colon. The                            polyp was sessile. The polyp was removed with a                            cold biopsy forceps. Resection and retrieval were                            complete.  Two sessile polyps were found in the rectum. The                            polyps were 3 to 4 mm in size. These polyps were                            removed with a cold snare. Resection and retrieval                            were complete.                           Scattered large-mouthed, medium-mouthed and                            small-mouthed diverticula were found in the sigmoid                            colon, descending colon, transverse colon and                            ascending colon. Peri-diverticular erythema was                            seen. There was evidence of an impacted                            diverticulum.                           Non-bleeding external and internal hemorrhoids were                            found during retroflexion. The hemorrhoids were                            small. Complications:            No immediate complications. Estimated  Blood Loss:     Estimated blood loss was minimal. Impression:               - One 1 mm polyp in the ascending colon, removed                            with a cold biopsy forceps. Resected and retrieved.                           - Two 3 to 4 mm polyps in the rectum, removed with                            a cold snare. Resected and retrieved.                           - Moderate diverticulosis in the sigmoid colon, in  the descending colon, in the transverse colon and                            in the ascending colon. Peri-diverticular erythema                            was seen. There was evidence of an impacted                            diverticulum.                           - Non-bleeding external and internal hemorrhoids. Recommendation:           - Patient has a contact number available for                            emergencies. The signs and symptoms of potential                            delayed complications were discussed with the                            patient. Return to normal activities tomorrow.                            Written discharge instructions were provided to the                            patient.                           - Resume previous diet.                           - Continue present medications.                           - Await pathology results.                           - Repeat colonoscopy in 5-10 years for surveillance                            based on pathology results.                           - For future colonoscopy the patient will require                            an extended preparation. If there are any                            questions, please contact the gastroenterologist. Napoleon Form, MD 02/02/2024 1:33:10 PM This report has been signed electronically.

## 2024-02-02 NOTE — Progress Notes (Unsigned)
 [facility] History and Physical   Primary Care Physician:  Ronnald Nian, MD   Reason for Procedure:  History of adenomatous colon polyps  Plan:    Surveillance colonoscopy with possible interventions as needed     HPI: Paula Cline is a very pleasant 68 y.o. female here for surveillance colonoscopy. Denies any nausea, vomiting, abdominal pain, melena or bright red blood per rectum  The risks and benefits as well as alternatives of endoscopic procedure(s) have been discussed and reviewed. All questions answered. The patient agrees to proceed.    Past Medical History:  Diagnosis Date   Allergy    RHINITIS   Arthritis    Diverticulitis    Former smoker    Hypercholesteremia    Hypertension    Obesity    SVT (supraventricular tachycardia) (HCC) 02/2020   SVT (supraventricular tachycardia) (HCC) 10/27/2019   Thyroid disease     Past Surgical History:  Procedure Laterality Date   COLONOSCOPY  2007/2008   Dr. Christella Hartigan   KNEE ARTHROSCOPY  2012   Right knee   SVT ABLATION N/A 03/01/2020   Procedure: SVT ABLATION;  Surgeon: Regan Lemming, MD;  Location: [facility];  Service: Cardiovascular;  Laterality: N/A;   WISDOM TOOTH EXTRACTION      Prior to Admission medications   Medication Sig Start Date End Date Taking? Authorizing Provider  ALPRAZolam Prudy Feeler) 0.25 MG tablet TAKE ONE TABLET BY MOUTH TWICE A DAY AS NEEDED FOR ANXIETY 01/26/24   Ronnald Nian, MD  atorvastatin (LIPITOR) 20 MG tablet Take 1 tablet (20 mg total) by mouth daily. 06/26/23   Ronnald Nian, MD  b complex vitamins capsule Take 1 capsule by mouth daily.    [provider]  calcium carbonate (OSCAL) 1500 (600 Ca) MG TABS tablet Take 600 mg of elemental calcium by mouth daily.    [provider]  Carboxymethylcellul-Glycerin (LUBRICATING EYE DROPS OP) Place 1 drop into both eyes daily as needed (dry eyes).    [provider]  Cholecalciferol (VITAMIN  D) 50 MCG (2000 UT) tablet Take 2,000 Units by mouth daily.    [provider]  diclofenac sodium (VOLTAREN) 1 % GEL Apply 1 application  topically 2 (two) times daily as needed (knee pain).    [provider]  Eszopiclone 3 MG TABS TAKE ONE TABLET BY MOUTH IMMEDIATELY AT BEDTIME AS NEEDED 10/30/23   Ronnald Nian, MD  fluticasone [facility]) 50 MCG/ACT nasal spray Place 1 spray into both nostrils daily as needed for allergies or rhinitis.    [provider]  Glucosamine-Chondroit-Vit C-Mn (GLUCOSAMINE CHONDR 1500 COMPLX) CAPS Take 1 capsule by mouth daily.    [provider]  hydrocortisone 2.5 % cream Apply 1 Application topically 2 (two) times daily.    [provider]  levothyroxine (SYNTHROID) 112 MCG tablet Take 1 tablet (112 mcg total) by mouth daily. 08/01/23   Ronnald Nian, MD  lisinopril-hydrochlorothiazide (ZESTORETIC) 20-12.5 MG tablet Take 1 tablet by mouth daily. 06/26/23   Ronnald Nian, MD  loratadine (CLARITIN) 10 MG tablet Take 10 mg by mouth as needed for allergies (seasonally).    [provider]  Magnesium 400 MG CAPS Take 400 mg by mouth at bedtime as needed (muscle relaxation).    [provider]  Multiple Vitamins-Minerals (WOMENS 50+ MULTI VITAMIN/MIN) TABS Take 1 tablet by mouth daily.     [provider]  Potassium 99 MG TABS Take 99 mg by  mouth at bedtime as needed (muscle relaxation).    [provider]  valACYclovir (VALTREX) 1000 MG tablet TAKE 2 TABLETS BY MOUTH TWICE A DAY FOR 1 DAY, THEN AS DIRECTED BY YOUR DOCTOR 01/27/24   Ronnald Nian, MD    Current Outpatient Medications  Medication Sig Dispense Refill   ALPRAZolam (XANAX) 0.25 MG tablet TAKE ONE TABLET BY MOUTH TWICE A DAY AS NEEDED FOR ANXIETY 15 tablet 0   atorvastatin (LIPITOR) 20 MG tablet Take 1 tablet (20 mg total) by mouth daily. 90 tablet 3   b complex vitamins capsule Take 1 capsule by mouth daily.     calcium  carbonate (OSCAL) 1500 (600 Ca) MG TABS tablet Take 600 mg of elemental calcium by mouth daily.     Carboxymethylcellul-Glycerin (LUBRICATING EYE DROPS OP) Place 1 drop into both eyes daily as needed (dry eyes).     Cholecalciferol (VITAMIN D) 50 MCG (2000 UT) tablet Take 2,000 Units by mouth daily.     diclofenac sodium (VOLTAREN) 1 % GEL Apply 1 application  topically 2 (two) times daily as needed (knee pain).     Eszopiclone 3 MG TABS TAKE ONE TABLET BY MOUTH IMMEDIATELY AT BEDTIME AS NEEDED 30 tablet 1   fluticasone (FLONASE) 50 MCG/ACT nasal spray Place 1 spray into both nostrils daily as needed for allergies or rhinitis.     Glucosamine-Chondroit-Vit C-Mn (GLUCOSAMINE CHONDR 1500 COMPLX) CAPS Take 1 capsule by mouth daily.     hydrocortisone 2.5 % cream Apply 1 Application topically 2 (two) times daily.     levothyroxine (SYNTHROID) 112 MCG tablet Take 1 tablet (112 mcg total) by mouth daily. 90 tablet 3   lisinopril-hydrochlorothiazide (ZESTORETIC) 20-12.5 MG tablet Take 1 tablet by mouth daily. 90 tablet 3   loratadine (CLARITIN) 10 MG tablet Take 10 mg by mouth as needed for allergies (seasonally).     Magnesium 400 MG CAPS Take 400 mg by mouth at bedtime as needed (muscle relaxation).     Multiple Vitamins-Minerals (WOMENS 50+ MULTI VITAMIN/MIN) TABS Take 1 tablet by mouth daily.      Potassium 99 MG TABS Take 99 mg by mouth at bedtime as needed (muscle relaxation).     valACYclovir (VALTREX) 1000 MG tablet TAKE 2 TABLETS BY MOUTH TWICE A DAY FOR 1 DAY, THEN AS DIRECTED BY YOUR DOCTOR 12 tablet 4   Current Facility-Administered Medications  Medication Dose Route Frequency Provider Last Rate Last Admin   0.9 %  sodium chloride infusion  500 mL Intravenous Continuous Cylan Borum, Eleonore Chiquito, MD        Allergies as of 02/02/2024 - Review Complete 02/02/2024  Allergen Reaction Noted   Codeine Itching 11/05/2020   Hydrocodone Itching 08/13/2013    Family History  Problem Relation Age of  Onset   Cancer Mother 50       Lung cancer   Cancer Father 54       Lung cancer   Alzheimer's disease Father    Hypertension Father    CAD Paternal Grandfather        died of heart attack   Colon cancer Neg Hx    Rectal cancer Neg Hx    Stomach cancer Neg Hx    Esophageal cancer Neg Hx     Social History   Socioeconomic History   Marital status: Single    Spouse name: Not on file   Number of children: Not on file   Years of education: Not on file   Highest  education level: Not on file  Occupational History   Not on file  Tobacco Use   Smoking status: Former    Current packs/day: 0.00    Types: Cigarettes    Quit date: 05/22/1993    Years since quitting: 30.7   Smokeless tobacco: Never  Vaping Use   Vaping status: Never Used  Substance and Sexual Activity   Alcohol use: Yes    Comment: rare   Drug use: No   Sexual activity: Not Currently  Other Topics Concern   Not on file  Social History Narrative   Not on file   Social Drivers of Health   Financial Resource Strain: Low Risk  (03/04/2023)   Overall Financial Resource Strain (CARDIA)    Difficulty of Paying Living Expenses: Not hard at all  Food Insecurity: No Food Insecurity (03/04/2023)   Hunger Vital Sign    Worried About Running Out of Food in the Last Year: Never true    Ran Out of Food in the Last Year: Never true  Transportation Needs: No Transportation Needs (03/04/2023)   PRAPARE - Administrator, Civil Service (Medical): No    Lack of Transportation (Non-Medical): No  Physical Activity: Insufficiently Active (03/04/2023)   Exercise Vital Sign    Days of Exercise per Week: 3 days    Minutes of Exercise per Session: 30 min  Stress: No Stress Concern Present (03/04/2023)   [employer] of Occupational Health - Occupational Stress Questionnaire    Feeling of Stress : Only a little  Social Connections: Unknown (03/04/2023)   Social Connection and Isolation Panel [NHANES]    Frequency of  Communication with Friends and Family: More than three times a week    Frequency of Social Gatherings with Friends and Family: Twice a week    Attends Religious Services: Not on Marketing executive or Organizations: Yes    Attends Engineer, structural: More than 4 times per year    Marital Status: Never married  Intimate Partner Violence: Not on file    Review of Systems:  All other review of systems negative except as mentioned in the HPI.  Physical Exam: Vital signs in last 24 hours: BP 133/87   Pulse 84   Temp 97.7 F (36.5 C)   Ht 5\' 1"  (1.549 m)   Wt 216 lb (98 kg)   LMP 11/18/2010   SpO2 99%   BMI 40.81 kg/m  General:   Alert, NAD Lungs:  Clear .   Heart:  Regular rate and rhythm Abdomen:  Soft, nontender and nondistended. Neuro/Psych:  Alert and cooperative. Normal mood and affect. A and O x 3  Reviewed labs, radiology imaging, old records and pertinent past GI work up  Patient is appropriate for planned procedure(s) and anesthesia in an ambulatory setting   K. Scherry Ran , MD [phone]

## 2024-02-02 NOTE — Progress Notes (Unsigned)
 VS by DT  Pt's states no medical or surgical changes since previsit or office visit.

## 2024-02-02 NOTE — Progress Notes (Unsigned)
 To pacu, VSS. Report to [email or web address]

## 2024-02-02 NOTE — Patient Instructions (Signed)

## 2024-02-02 NOTE — Progress Notes (Unsigned)
 Called to room to assist during endoscopic procedure.  Patient ID and intended procedure confirmed with present staff. Received instructions for my participation in the procedure from the performing physician.

## 2024-02-03 ENCOUNTER — Telehealth: Payer: Self-pay

## 2024-02-03 NOTE — Telephone Encounter (Signed)
  Follow up Call-     02/02/2024   12:49 PM  Call back number  Post procedure Call Back phone  # [phone]  Permission to leave phone message Yes     Patient questions:  Do you have a fever, pain , or abdominal swelling? No. Pain Score  0 *  Have you tolerated food without any problems? Yes.    Have you been able to return to your normal activities? Yes.    Do you have any questions about your discharge instructions: Diet   No. Medications  No. Follow up visit  No.  Do you have questions or concerns about your Care? No.  Actions: * If pain score is 4 or above: No action needed, pain <4.

## 2024-02-04 ENCOUNTER — Encounter: Payer: Self-pay | Admitting: Gastroenterology

## 2024-02-05 LAB — SURGICAL PATHOLOGY

## 2024-03-09 ENCOUNTER — Ambulatory Visit: Payer: PPO

## 2024-03-09 DIAGNOSIS — Z0000 Encounter for general adult medical examination without abnormal findings: Secondary | ICD-10-CM

## 2024-03-09 NOTE — Patient Instructions (Signed)
 [name] , Thank you for taking time to come for your Medicare Wellness Visit. I appreciate your ongoing commitment to your health goals. Please review the following plan we discussed and let me know if I can assist you in the future.   Referrals/Orders/Follow-Ups/Clinician Recommendations: none  This is a list of the screening recommended for you and due dates:  Health Maintenance  Topic Date Due   COVID-19 Vaccine (9 - Pfizer risk 2024-25 season) 02/05/2024   Flu Shot  06/18/2024   Medicare Annual Wellness Visit  03/09/2025   Mammogram  05/07/2025   Colon Cancer Screening  02/02/2031   DTaP/Tdap/Td vaccine (5 - Td or Tdap) 07/03/2033   Pneumonia Vaccine  Completed   DEXA scan (bone density measurement)  Completed   Hepatitis C Screening  Completed   Zoster (Shingles) Vaccine  Completed   HPV Vaccine  Aged Out   Meningitis B Vaccine  Aged Out    Advanced directives: (In Chart) A copy of your advanced directives are scanned into your chart should your provider ever need it.  Next Medicare Annual Wellness Visit scheduled for next year: Yes  insert Preventive Care attachment Insert FALL PREVENTION attachment if needed

## 2024-03-09 NOTE — Progress Notes (Signed)
 Subjective:   Paula Cline is a 68 y.o. who presents for a Medicare Wellness preventive visit.  Visit Complete: Virtual I connected with  Paula Cline on 03/09/24 by a audio enabled telemedicine application and verified that I am speaking with the correct person using two identifiers.  Patient Location: Home  Provider Location: Office/Clinic  I discussed the limitations of evaluation and management by telemedicine. The patient expressed understanding and agreed to proceed.  Vital Signs: Because this visit was a virtual/telehealth visit, some criteria may be missing or patient reported. Any vitals not documented were not able to be obtained and vitals that have been documented are patient reported.  VideoError- Librarian, academic were attempted between this provider and patient, however failed, due to patient having technical difficulties OR patient did not have access to video capability.  We continued and completed visit with audio only.   Persons Participating in Visit: Patient.  AWV Questionnaire: Yes: Patient Medicare AWV questionnaire was completed by the patient on 03/06/2024; I have confirmed that all information answered by patient is correct and no changes since this date.  Cardiac Risk Factors include: advanced age (>71men, >69 women);dyslipidemia;hypertension     Objective:    Today's Vitals   There is no height or weight on file to calculate BMI.     03/09/2024   10:11 AM 03/04/2023   10:16 AM 06/24/2022    1:49 PM 03/01/2020   12:48 PM 10/27/2019    3:44 AM 10/26/2019    1:27 PM 01/31/2017    7:26 AM  Advanced Directives  Does Patient Have a Medical Advance Directive? Yes Yes Yes Yes  Yes Yes  Type of Estate agent of [place];Living will Healthcare Power of [place];Living will Living will;Healthcare Power of [employer] Power of Attorney Living will;Healthcare Power of Attorney Living will;Healthcare Power  of Attorney   Does patient want to make changes to medical advance directive?   No - Patient declined No - Patient declined No - Patient declined    Copy of Healthcare Power of Attorney in Chart? Yes - validated most recent copy scanned in chart (See row information) Yes - validated most recent copy scanned in chart (See row information) No - copy requested   No - copy requested   Would patient like information on creating a medical advance directive?     No - Patient declined      Current Medications (verified) Outpatient Encounter Medications as of 03/09/2024  Medication Sig   ALPRAZolam  (XANAX ) 0.25 MG tablet TAKE ONE TABLET BY MOUTH TWICE A DAY AS NEEDED FOR ANXIETY   atorvastatin  (LIPITOR) 20 MG tablet Take 1 tablet (20 mg total) by mouth daily.   b complex vitamins capsule Take 1 capsule by mouth daily.   calcium  carbonate (OSCAL) 1500 (600 Ca) MG TABS tablet Take 600 mg of elemental calcium  by mouth daily.   Carboxymethylcellul-Glycerin (LUBRICATING EYE DROPS OP) Place 1 drop into both eyes daily as needed (dry eyes).   Cholecalciferol  (VITAMIN D ) 50 MCG (2000 UT) tablet Take 2,000 Units by mouth daily.   diclofenac sodium (VOLTAREN) 1 % GEL Apply 1 application  topically 2 (two) times daily as needed (knee pain).   Eszopiclone  3 MG TABS TAKE ONE TABLET BY MOUTH IMMEDIATELY AT BEDTIME AS NEEDED   fluticasone  (FLONASE ) 50 MCG/ACT nasal spray Place 1 spray into both nostrils daily as needed for allergies or rhinitis.   Glucosamine-Chondroit-Vit C-Mn (GLUCOSAMINE CHONDR 1500 COMPLX) CAPS Take  1 capsule by mouth daily.   hydrocortisone  2.5 % cream Apply 1 Application topically 2 (two) times daily.   levothyroxine  (SYNTHROID ) 112 MCG tablet Take 1 tablet (112 mcg total) by mouth daily.   lisinopril -hydrochlorothiazide  (ZESTORETIC ) 20-12.5 MG tablet Take 1 tablet by mouth daily.   loratadine (CLARITIN) 10 MG tablet Take 10 mg by mouth as needed for allergies (seasonally).   Magnesium  400 MG  CAPS Take 400 mg by mouth at bedtime as needed (muscle relaxation).   Multiple Vitamins-Minerals (WOMENS 50+ MULTI VITAMIN/MIN) TABS Take 1 tablet by mouth daily.    Potassium 99 MG TABS Take 99 mg by mouth at bedtime as needed (muscle relaxation).   valACYclovir  (VALTREX ) 1000 MG tablet TAKE 2 TABLETS BY MOUTH TWICE A DAY FOR 1 DAY, THEN AS DIRECTED BY YOUR DOCTOR (Patient not taking: Reported on 03/09/2024)   No facility-administered encounter medications on file as of 03/09/2024.    Allergies (verified) Codeine and Hydrocodone   History: Past Medical History:  Diagnosis Date   Allergy    RHINITIS   Arthritis    Diverticulitis    Former smoker    Hypercholesteremia    Hypertension    Obesity    SVT (supraventricular tachycardia) (HCC) 02/2020   SVT (supraventricular tachycardia) (HCC) 10/27/2019   Thyroid  disease    Past Surgical History:  Procedure Laterality Date   COLONOSCOPY  2007/2008   Dr. Howard Macho   KNEE ARTHROSCOPY  2012   Right knee   SVT ABLATION N/A 03/01/2020   Procedure: SVT ABLATION;  Surgeon: Lei Pump, MD;  Location: [facility];  Service: Cardiovascular;  Laterality: N/A;   WISDOM TOOTH EXTRACTION     Family History  Problem Relation Age of Onset   Cancer Mother 33       Lung cancer   Cancer Father 66       Lung cancer   Alzheimer's disease Father    Hypertension Father    CAD Paternal Grandfather        died of heart attack   Colon cancer Neg Hx    Rectal cancer Neg Hx    Stomach cancer Neg Hx    Esophageal cancer Neg Hx    Social History   Socioeconomic History   Marital status: Single    Spouse name: Not on file   Number of children: Not on file   Years of education: Not on file   Highest education level: Bachelor's degree (e.g., BA, AB, BS)  Occupational History   Not on file  Tobacco Use   Smoking status: Former    Current packs/day: 0.00    Types: Cigarettes    Quit date: 05/22/1993    Years since quitting: 30.8    Smokeless tobacco: Never  Vaping Use   Vaping status: Never Used  Substance and Sexual Activity   Alcohol use: Not Currently    Comment: rare   Drug use: No   Sexual activity: Not Currently  Other Topics Concern   Not on file  Social History Narrative   Not on file   Social Drivers of Health   Financial Resource Strain: Low Risk  (03/06/2024)   Overall Financial Resource Strain (CARDIA)    Difficulty of Paying Living Expenses: Not hard at all  Food Insecurity: No Food Insecurity (03/06/2024)   Hunger Vital Sign    Worried About Running Out of Food in the Last Year: Never true    Ran Out of Food in the Last Year:  Never true  Transportation Needs: No Transportation Needs (03/06/2024)   PRAPARE - Administrator, Civil Service (Medical): No    Lack of Transportation (Non-Medical): No  Physical Activity: Sufficiently Active (03/06/2024)   Exercise Vital Sign    Days of Exercise per Week: 3 days    Minutes of Exercise per Session: 60 min  Stress: No Stress Concern Present (03/06/2024)   [employer] of Occupational Health - Occupational Stress Questionnaire    Feeling of Stress : Only a little  Social Connections: Moderately Isolated (03/06/2024)   Social Connection and Isolation Panel [NHANES]    Frequency of Communication with Friends and Family: More than three times a week    Frequency of Social Gatherings with Friends and Family: Twice a week    Attends Religious Services: Never    Database administrator or Organizations: Yes    Attends Engineer, structural: More than 4 times per year    Marital Status: Never married    Tobacco Counseling Counseling given: Not Answered    Clinical Intake:  Pre-visit preparation completed: Yes  Pain : No/denies pain     Nutritional Risks: None Diabetes: No  Lab Results  Component Value Date   HGBA1C 5.3 10/26/2019     How often do you need to have someone help you when you read instructions, pamphlets,  or other written materials from your doctor or pharmacy?: 1 - Never  Interpreter Needed?: No  Information entered by :: NAllen LPN   Activities of Daily Living     03/06/2024    8:41 AM  In your present state of health, do you have any difficulty performing the following activities:  Hearing? 0  Vision? 0  Difficulty concentrating or making decisions? 0  Walking or climbing stairs? 0  Dressing or bathing? 0  Doing errands, shopping? 0  Preparing Food and eating ? N  Using the Toilet? N  In the past six months, have you accidently leaked urine? Y  Do you have problems with loss of bowel control? N  Managing your Medications? N  Managing your Finances? N  Housekeeping or managing your Housekeeping? N    Patient Care Team: Watson Hacking, MD as PCP - General Maudine Sos, MD as PCP - Cardiology (Cardiology)  Indicate any recent Medical Services you may have received from other than [facility] providers in the past year (date may be approximate).     Assessment:   This is a routine wellness examination for Rmani.  Hearing/Vision screen Hearing Screening - Comments:: Denies hearing issues Vision Screening - Comments:: Regular eye exams, [facility]   Goals Addressed             This Visit's Progress    Patient Stated       03/09/2024, wants to get through surgery       Depression Screen     03/09/2024   10:12 AM 06/26/2023    8:16 AM 03/04/2023   10:17 AM 06/24/2022    1:50 PM 06/14/2021    8:31 AM 06/08/2020    8:20 AM 06/03/2019    9:00 AM  PHQ 2/9 Scores  PHQ - 2 Score 0 0 0 0 0 0 0  PHQ- 9 Score 1  0        Fall Risk     03/06/2024    8:41 AM 06/26/2023    8:16 AM 03/04/2023    8:44 AM 12/03/2022    3:49 PM 06/24/2022  1:50 PM  Fall Risk   Falls in the past year? 0 0 0 0 1  Number falls in past yr: 0 0 0 0 0  Injury with Fall? 0 0 0 0 0  Risk for fall due to : Medication side effect No Fall Risks Medication side effect No Fall Risks No Fall Risks  Follow up  Falls prevention discussed;Falls evaluation completed Falls evaluation completed Falls prevention discussed;Education provided;Falls evaluation completed Falls evaluation completed Falls evaluation completed    MEDICARE RISK AT HOME:  Medicare Risk at Home Any stairs in or around the home?: (Patient-Rptd) Yes If so, are there any without handrails?: (Patient-Rptd) No Home free of loose throw rugs in walkways, pet beds, electrical cords, etc?: (Patient-Rptd) Yes Adequate lighting in your home to reduce risk of falls?: (Patient-Rptd) Yes Life alert?: (Patient-Rptd) No Use of a cane, walker or w/c?: (Patient-Rptd) No Grab bars in the bathroom?: (Patient-Rptd) Yes Shower chair or bench in shower?: (Patient-Rptd) No Elevated toilet seat or a handicapped toilet?: (Patient-Rptd) No  TIMED UP AND GO:  Was the test performed?  No  Cognitive Function: 6CIT completed        03/09/2024   10:13 AM 03/04/2023   10:18 AM 06/24/2022    1:53 PM  6CIT Screen  What Year? 0 points 0 points 0 points  What month? 0 points 0 points 0 points  What time? 0 points 0 points 0 points  Count back from 20 0 points 0 points 0 points  Months in reverse 0 points 0 points 0 points  Repeat phrase 0 points 0 points 0 points  Total Score 0 points 0 points 0 points    Immunizations Immunization History  Administered Date(s) Administered   DTaP 10/25/1996, 07/24/2004   Hepatitis A 07/24/2004, 10/03/2008   Influenza Split 07/31/2011, 10/05/2012, 08/02/2015   Influenza Whole 09/22/2007, 08/19/2008, 10/10/2009   Influenza, High Dose Seasonal PF 08/08/2023   Influenza, Quadrivalent, Recombinant, Inj, Pf 09/01/2018, 08/03/2019   Influenza,inj,Quad PF,6+ Mos 08/13/2013, 10/31/2014, 08/15/2017, 09/01/2020   Influenza-Unspecified 09/06/2016, 08/15/2017, 09/05/2019, 08/08/2021, 08/12/2022   Moderna Covid-19 Vaccine Bivalent Booster 44yrs & up 08/21/2021   PFIZER Comirnaty(Gray Top)Covid-19 Tri-Sucrose Vaccine  02/21/2021   PFIZER(Purple Top)SARS-COV-2 Vaccination 02/02/2020, 02/23/2020, 09/01/2020   PNEUMOCOCCAL CONJUGATE-20 06/24/2022   PPD Test 12/18/1997   Pfizer(Comirnaty)Fall Seasonal Vaccine 12 years and older 08/18/2022, 02/11/2023, 08/08/2023   Respiratory Syncytial Virus Vaccine,Recomb Aduvanted(Arexvy) 07/11/2023   Tdap 11/08/2013, 07/04/2023   Zoster Recombinant(Shingrix ) 02/09/2018, 04/16/2018   Zoster, Live 08/02/2015    Screening Tests Health Maintenance  Topic Date Due   COVID-19 Vaccine (9 - Pfizer risk 2024-25 season) 02/05/2024   INFLUENZA VACCINE  06/18/2024   Medicare Annual Wellness (AWV)  03/09/2025   MAMMOGRAM  05/07/2025   Colonoscopy  02/02/2031   DTaP/Tdap/Td (5 - Td or Tdap) 07/03/2033   Pneumonia Vaccine 77+ Years old  Completed   DEXA SCAN  Completed   Hepatitis C Screening  Completed   Zoster Vaccines- Shingrix   Completed   HPV VACCINES  Aged Out   Meningococcal B Vaccine  Aged Out    Health Maintenance  Health Maintenance Due  Topic Date Due   COVID-19 Vaccine (9 - Pfizer risk 2024-25 season) 02/05/2024   Health Maintenance Items Addressed: Up to date  Additional Screening:  Vision Screening: Recommended annual ophthalmology exams for early detection of glaucoma and other disorders of the eye.  Dental Screening: Recommended annual dental exams for proper oral hygiene  Community Resource Referral / Chronic Care  Management: CRR required this visit?  No   CCM required this visit?  No     Plan:     I have personally reviewed and noted the following in the patient's chart:   Medical and social history Use of alcohol, tobacco or illicit drugs  Current medications and supplements including opioid prescriptions. Patient is not currently taking opioid prescriptions. Functional ability and status Nutritional status Physical activity Advanced directives List of other physicians Hospitalizations, surgeries, and ER visits in previous 12  months Vitals Screenings to include cognitive, depression, and falls Referrals and appointments  In addition, I have reviewed and discussed with patient certain preventive protocols, quality metrics, and best practice recommendations. A written personalized care plan for preventive services as well as general preventive health recommendations were provided to patient.     Areatha Beecham, LPN   1/61/0960   After Visit Summary: (MyChart) Due to this being a telephonic visit, the after visit summary with patients personalized plan was offered to patient via MyChart   Notes: Nothing significant to report at this time.

## 2024-03-17 ENCOUNTER — Encounter: Payer: Self-pay | Admitting: Family Medicine

## 2024-03-17 ENCOUNTER — Ambulatory Visit: Admitting: Family Medicine

## 2024-03-17 DIAGNOSIS — M1710 Unilateral primary osteoarthritis, unspecified knee: Secondary | ICD-10-CM | POA: Diagnosis not present

## 2024-03-17 MED ORDER — PHENTERMINE HCL 15 MG PO CAPS: 15.0000 mg | ORAL_CAPSULE | ORAL | 0 refills | Status: DC

## 2024-03-17 NOTE — Progress Notes (Signed)
   Subjective:    Patient ID: Paula Cline, female    DOB: 04-14-1956, 68 y.o.   MRN: [id number]  HPI She is here for consult concerning weight loss prior to knee surgery.  She apparently has surgery scheduled for the end of May.  She would like help with weight loss to get the ball rolling.   Review of Systems     Objective:    Physical Exam Alert and in no distress otherwise not examined       Assessment & Plan:  Morbid obesity (HCC)  Arthritis of knee  I discussed various ways to help this including using a GLP-1 which at this point she does not qualify for.  Discussed the need for her to make as many diet and exercise changes that she can.  I will place her on phentermine and give her a 1 month supply.  She will keep in touch with me on how well she is doing.

## 2024-03-18 ENCOUNTER — Telehealth: Payer: Self-pay

## 2024-03-18 ENCOUNTER — Other Ambulatory Visit (HOSPITAL_COMMUNITY): Payer: Self-pay

## 2024-03-18 NOTE — Telephone Encounter (Signed)
      Please see note above and Test claim ''Zepbound' is a plan benefit exclusion/ Not covered for weightloss.

## 2024-03-18 NOTE — Telephone Encounter (Signed)
 Copied from CRM [id number]. Topic: Clinical - Prescription Issue >> Mar 18, 2024  2:04 PM Leory Rands wrote: Reason for CRM: [employer] is calling to check on the status of PA zepbound and wegovy on CB- [phone] option 2

## 2024-03-18 NOTE — Telephone Encounter (Signed)
     Paula Cline is a plan benefit exclusion for weightloss please see above note and testclaim

## 2024-03-22 ENCOUNTER — Encounter: Payer: Self-pay | Admitting: Family Medicine

## 2024-03-23 NOTE — H&P (Signed)
 Patient's anticipated LOS is less than 2 midnights, meeting these requirements: - Younger than 88 - Lives within 1 hour of care - Has a competent adult at home to recover with post-op recover - NO history of  - Chronic pain requiring opiods  - Diabetes  - Coronary Artery Disease  - Heart failure  - Heart attack  - Stroke  - DVT/VTE  - Cardiac arrhythmia  - Respiratory Failure/COPD  - Renal failure  - Anemia  - Advanced Liver disease     Paula Cline is an 68 y.o. female.    Chief Complaint: left knee pain  HPI: Pt is a 68 y.o. female complaining of left knee pain for multiple years. Pain had continually increased since the beginning. X-rays in the clinic show end-stage arthritic changes of the left knee. Pt has tried various conservative treatments which have failed to alleviate their symptoms, including injections and therapy. Various options are discussed with the patient. Risks, benefits and expectations were discussed with the patient. Patient understand the risks, benefits and expectations and wishes to proceed with surgery.   PCP:  Watson Hacking, MD  D/C Plans: Home  PMH: Past Medical History:  Diagnosis Date   Allergy    RHINITIS   Arthritis    Diverticulitis    Former smoker    Hypercholesteremia    Hypertension    Obesity    SVT (supraventricular tachycardia) (HCC) 02/2020   SVT (supraventricular tachycardia) (HCC) 10/27/2019   Thyroid  disease     PSH: Past Surgical History:  Procedure Laterality Date   COLONOSCOPY  2007/2008   Dr. Howard Macho   KNEE ARTHROSCOPY  2012   Right knee   SVT ABLATION N/A 03/01/2020   Procedure: SVT ABLATION;  Surgeon: Lei Pump, MD;  Location: [facility];  Service: Cardiovascular;  Laterality: N/A;   WISDOM TOOTH EXTRACTION      Social History:  reports that she quit smoking about 30 years ago. Her smoking use included cigarettes. She has never used smokeless tobacco. She reports that she does not  currently use alcohol. She reports that she does not use drugs. BMI: Estimated body mass index is 41.76 kg/m as calculated from the following:   Height as of 02/02/24: 5\' 1"  (1.549 m).   Weight as of 03/17/24: 100.2 kg.  Lab Results  Component Value Date   ALBUMIN 4.2 06/26/2023   Diabetes: Patient does not have a diagnosis of diabetes.     Smoking Status:      Allergies:  Allergies  Allergen Reactions   Codeine Itching   Hydrocodone Itching    Medications: No current facility-administered medications for this encounter.   Current Outpatient Medications  Medication Sig Dispense Refill   ALPRAZolam  (XANAX ) 0.25 MG tablet TAKE ONE TABLET BY MOUTH TWICE A DAY AS NEEDED FOR ANXIETY (Patient not taking: Reported on 03/17/2024) 15 tablet 0   atorvastatin  (LIPITOR) 20 MG tablet Take 1 tablet (20 mg total) by mouth daily. 90 tablet 3   b complex vitamins capsule Take 1 capsule by mouth daily.     calcium  carbonate (OSCAL) 1500 (600 Ca) MG TABS tablet Take 600 mg of elemental calcium  by mouth daily.     Carboxymethylcellul-Glycerin (LUBRICATING EYE DROPS OP) Place 1 drop into both eyes daily as needed (dry eyes).     Cholecalciferol  (VITAMIN D ) 50 MCG (2000 UT) tablet Take 2,000 Units by mouth daily.     diclofenac sodium (VOLTAREN) 1 % GEL Apply 1 application  topically 2 (two) times daily as needed (knee pain).     Eszopiclone  3 MG TABS TAKE ONE TABLET BY MOUTH IMMEDIATELY AT BEDTIME AS NEEDED 30 tablet 1   fluticasone  (FLONASE ) 50 MCG/ACT nasal spray Place 1 spray into both nostrils daily as needed for allergies or rhinitis.     Glucosamine-Chondroit-Vit C-Mn (GLUCOSAMINE CHONDR 1500 COMPLX) CAPS Take 1 capsule by mouth daily.     hydrocortisone  2.5 % cream Apply 1 Application topically 2 (two) times daily.     levothyroxine  (SYNTHROID ) 112 MCG tablet Take 1 tablet (112 mcg total) by mouth daily. 90 tablet 3   lisinopril -hydrochlorothiazide  (ZESTORETIC ) 20-12.5 MG tablet Take 1  tablet by mouth daily. 90 tablet 3   loratadine (CLARITIN) 10 MG tablet Take 10 mg by mouth as needed for allergies (seasonally).     Magnesium  400 MG CAPS Take 400 mg by mouth at bedtime as needed (muscle relaxation).     Multiple Vitamins-Minerals (WOMENS 50+ MULTI VITAMIN/MIN) TABS Take 1 tablet by mouth daily.      phentermine  15 MG capsule Take 1 capsule (15 mg total) by mouth every morning. 30 capsule 0   Potassium 99 MG TABS Take 99 mg by mouth at bedtime as needed (muscle relaxation).     valACYclovir  (VALTREX ) 1000 MG tablet TAKE 2 TABLETS BY MOUTH TWICE A DAY FOR 1 DAY, THEN AS DIRECTED BY YOUR DOCTOR 12 tablet 4    No results found for this or any previous visit (from the past 48 hours). No results found.  ROS: Pain with rom of the left lower extremity  Physical Exam: Alert and oriented 68 y.o. female in no acute distress Cranial nerves 2-12 intact Cervical spine: full rom with no tenderness, nv intact distally Chest: active breath sounds bilaterally, no wheeze rhonchi or rales Heart: regular rate and rhythm, no murmur Abd: non tender non distended with active bowel sounds Hip is stable with rom  Left knee painful rom with crepitus Nv intact distally Antalgic gait  Assessment/Plan Assessment: left knee end stage osteoarthritis  Plan:  Patient will undergo a left total knee by Dr. Brunilda Capra at [facility] Risks benefits and expectations were discussed with the patient. Patient understand risks, benefits and expectations and wishes to proceed. Preoperative templating of the joint replacement has been completed, documented, and submitted to the Operating Room personnel in order to optimize intra-operative equipment management.   Lorina Roosevelt PA-C, MPAS [facility] is now [employer] [address], [place], [place] [address] Phone: [phone] [email or web address] Facebook  [employer]

## 2024-03-29 ENCOUNTER — Encounter: Payer: Self-pay | Admitting: Gastroenterology

## 2024-03-29 DIAGNOSIS — D126 Benign neoplasm of colon, unspecified: Secondary | ICD-10-CM

## 2024-03-31 DIAGNOSIS — D126 Benign neoplasm of colon, unspecified: Secondary | ICD-10-CM | POA: Insufficient documentation

## 2024-04-02 DIAGNOSIS — M19071 Primary osteoarthritis, right ankle and foot: Secondary | ICD-10-CM | POA: Diagnosis not present

## 2024-04-02 NOTE — Patient Instructions (Addendum)
 SURGICAL WAITING ROOM VISITATION  Patients having surgery or a procedure may have no more than 2 support people in the waiting area - these visitors may rotate.    Children under the age of 39 must have an adult with them who is not the patient.  Due to an increase in RSV and influenza rates and associated hospitalizations, children ages 58 and under may not visit patients in [facility] hospitals.  Visitors with respiratory illnesses are discouraged from visiting and should remain at home.  If the patient needs to stay at the hospital during part of their recovery, the visitor guidelines for inpatient rooms apply. Pre-op nurse will coordinate an appropriate time for 1 support person to accompany patient in pre-op.  This support person may not rotate.    Please refer to the [facility] website for the visitor guidelines for Inpatients (after your surgery is over and you are in a regular room).    Your procedure is scheduled on: 04/16/24   Report to [facility] Main Entrance    Report to admitting at 7:30 AM   Call this number if you have problems the morning of surgery [phone]   Do not eat food :After Midnight.   After Midnight you may have the following liquids until 7:00 AM DAY OF SURGERY  Water Non-Citrus Juices (without pulp, NO RED-Apple, White grape, White cranberry) Black Coffee (NO MILK/CREAM OR CREAMERS, sugar ok)  Clear Tea (NO MILK/CREAM OR CREAMERS, sugar ok) regular and decaf                             Plain Jell-O (NO RED)                                           Fruit ices (not with fruit pulp, NO RED)                                     Popsicles (NO RED)                                                               Sports drinks like Gatorade (NO RED)                 The day of surgery:  Drink ONE (1) Pre-Surgery Clear Ensure at 7:00 AM the morning of surgery. Drink in one sitting. Do not sip.  This drink was given to you during your hospital   pre-op appointment visit. Nothing else to drink after completing the  Pre-Surgery Clear Ensure.          If you have questions, please contact your surgeon's office.   FOLLOW BOWEL PREP AND ANY ADDITIONAL PRE OP INSTRUCTIONS YOU RECEIVED FROM YOUR SURGEON'S OFFICE!!!     Oral Hygiene is also important to reduce your risk of infection.                                    Remember -  BRUSH YOUR TEETH THE MORNING OF SURGERY WITH YOUR REGULAR TOOTHPASTE  DENTURES WILL BE REMOVED PRIOR TO SURGERY PLEASE DO NOT APPLY "Poly grip" OR ADHESIVES!!!   Stop all vitamins and herbal supplements 7 days before surgery.   Take these medicines the morning of surgery with A SIP OF WATER: Tylenol , Alprazolam , Atorvastatin , Flonase , Levothyroxine                                You may not have any metal on your body including hair pins, jewelry, and body piercing             Do not wear make-up, lotions, powders, perfumes, or deodorant  Do not wear nail polish including gel and S&S, artificial/acrylic nails, or any other type of covering on natural nails including finger and toenails. If you have artificial nails, gel coating, etc. that needs to be removed by a nail salon please have this removed prior to surgery or surgery may need to be canceled/ delayed if the surgeon/ anesthesia feels like they are unable to be safely monitored.   Do not shave  48 hours prior to surgery.    Do not bring valuables to the hospital. [facility] [facility] IS NOT             RESPONSIBLE   FOR VALUABLES.   Contacts, glasses, dentures or bridgework may not be worn into surgery.   Bring small overnight bag day of surgery.   DO NOT BRING YOUR HOME MEDICATIONS TO THE HOSPITAL. PHARMACY WILL DISPENSE MEDICATIONS LISTED ON YOUR MEDICATION LIST TO YOU DURING YOUR ADMISSION IN THE HOSPITAL!              Please read over the following fact sheets you were given: IF YOU HAVE QUESTIONS ABOUT YOUR PRE-OP INSTRUCTIONS PLEASE CALL  [name]   If you received a COVID test during your pre-op visit  it is requested that you wear a mask when out in public, stay away from anyone that may not be feeling well and notify your surgeon if you develop symptoms. If you test positive for Covid or have been in contact with anyone that has tested positive in the last 10 days please notify you surgeon.      Pre-operative 5 CHG Bath Instructions   You can play a key role in reducing the risk of infection after surgery. Your skin needs to be as free of germs as possible. You can reduce the number of germs on your skin by washing with CHG (chlorhexidine gluconate) soap before surgery. CHG is an antiseptic soap that kills germs and continues to kill germs even after washing.   DO NOT use if you have an allergy to chlorhexidine/CHG or antibacterial soaps. If your skin becomes reddened or irritated, stop using the CHG and notify one of our RNs at [phone].   Please shower with the CHG soap starting 4 days before surgery using the following schedule:     Please keep in mind the following:  DO NOT shave, including legs and underarms, starting the day of your first shower.   You may shave your face at any point before/day of surgery.  Place clean sheets on your bed the day you start using CHG soap. Use a clean washcloth (not used since being washed) for each shower. DO NOT sleep with pets once you start using the CHG.   CHG Shower Instructions:  If you choose to  wash your hair and private area, wash first with your normal shampoo/soap.  After you use shampoo/soap, rinse your hair and body thoroughly to remove shampoo/soap residue.  Turn the water OFF and apply about 3 tablespoons (45 ml) of CHG soap to a CLEAN washcloth.  Apply CHG soap ONLY FROM YOUR NECK DOWN TO YOUR TOES (washing for 3-5 minutes)  DO NOT use CHG soap on face, private areas, open wounds, or sores.  Pay special attention to the area where your surgery is  being performed.  If you are having back surgery, having someone wash your back for you may be helpful. Wait 2 minutes after CHG soap is applied, then you may rinse off the CHG soap.  Pat dry with a clean towel  Put on clean clothes/pajamas   If you choose to wear lotion, please use ONLY the CHG-compatible lotions on the back of this paper.     Additional instructions for the day of surgery: DO NOT APPLY any lotions, deodorants, cologne, or perfumes.   Put on clean/comfortable clothes.  Brush your teeth.  Ask your nurse before applying any prescription medications to the skin.      CHG Compatible Lotions   Aveeno Moisturizing lotion  Cetaphil Moisturizing Cream  Cetaphil Moisturizing Lotion  Clairol Herbal Essence Moisturizing Lotion, Dry Skin  Clairol Herbal Essence Moisturizing Lotion, Extra Dry Skin  Clairol Herbal Essence Moisturizing Lotion, Normal Skin  Curel Age Defying Therapeutic Moisturizing Lotion with Alpha Hydroxy  Curel Extreme Care Body Lotion  Curel Soothing Hands Moisturizing Hand Lotion  Curel Therapeutic Moisturizing Cream, Fragrance-Free  Curel Therapeutic Moisturizing Lotion, Fragrance-Free  Curel Therapeutic Moisturizing Lotion, Original Formula  Eucerin Daily Replenishing Lotion  Eucerin Dry Skin Therapy Plus Alpha Hydroxy Crme  Eucerin Dry Skin Therapy Plus Alpha Hydroxy Lotion  Eucerin Original Crme  Eucerin Original Lotion  Eucerin Plus Crme Eucerin Plus Lotion  Eucerin TriLipid Replenishing Lotion  Keri Anti-Bacterial Hand Lotion  Keri Deep Conditioning Original Lotion Dry Skin Formula Softly Scented  Keri Deep Conditioning Original Lotion, Fragrance Free Sensitive Skin Formula  Keri Lotion Fast Absorbing Fragrance Free Sensitive Skin Formula  Keri Lotion Fast Absorbing Softly Scented Dry Skin Formula  Keri Original Lotion  Keri Skin Renewal Lotion Keri Silky Smooth Lotion  Keri Silky Smooth Sensitive Skin Lotion  Nivea Body Creamy  Conditioning Oil  Nivea Body Extra Enriched Lotion  Nivea Body Original Lotion  Nivea Body Sheer Moisturizing Lotion Nivea Crme  Nivea Skin Firming Lotion  NutraDerm 30 Skin Lotion  NutraDerm Skin Lotion  NutraDerm Therapeutic Skin Cream  NutraDerm Therapeutic Skin Lotion  ProShield Protective Hand Cream  Provon moisturizing lotion   Incentive Spirometer  An incentive spirometer is a tool that can help keep your lungs clear and active. This tool measures how well you are filling your lungs with each breath. Taking long deep breaths may help reverse or decrease the chance of developing breathing (pulmonary) problems (especially infection) following: A long period of time when you are unable to move or be active. BEFORE THE PROCEDURE  If the spirometer includes an indicator to show your best effort, your nurse or respiratory therapist will set it to a desired goal. If possible, sit up straight or lean slightly forward. Try not to slouch. Hold the incentive spirometer in an upright position. INSTRUCTIONS FOR USE  Sit on the edge of your bed if possible, or sit up as far as you can in bed or on a chair. Hold the incentive spirometer in  an upright position. Breathe out normally. Place the mouthpiece in your mouth and seal your lips tightly around it. Breathe in slowly and as deeply as possible, raising the piston or the ball toward the top of the column. Hold your breath for 3-5 seconds or for as long as possible. Allow the piston or ball to fall to the bottom of the column. Remove the mouthpiece from your mouth and breathe out normally. Rest for a few seconds and repeat Steps 1 through 7 at least 10 times every 1-2 hours when you are awake. Take your time and take a few normal breaths between deep breaths. The spirometer may include an indicator to show your best effort. Use the indicator as a goal to work toward during each repetition. After each set of 10 deep breaths, practice coughing  to be sure your lungs are clear. If you have an incision (the cut made at the time of surgery), support your incision when coughing by placing a pillow or rolled up towels firmly against it. Once you are able to get out of bed, walk around indoors and cough well. You may stop using the incentive spirometer when instructed by your caregiver.  RISKS AND COMPLICATIONS Take your time so you do not get dizzy or light-headed. If you are in pain, you may need to take or ask for pain medication before doing incentive spirometry. It is harder to take a deep breath if you are having pain. AFTER USE Rest and breathe slowly and easily. It can be helpful to keep track of a log of your progress. Your caregiver can provide you with a simple table to help with this. If you are using the spirometer at home, follow these instructions: SEEK MEDICAL CARE IF:  You are having difficultly using the spirometer. You have trouble using the spirometer as often as instructed. Your pain medication is not giving enough relief while using the spirometer. You develop fever of 100.5 F (38.1 C) or higher. SEEK IMMEDIATE MEDICAL CARE IF:  You cough up bloody sputum that had not been present before. You develop fever of 102 F (38.9 C) or greater. You develop worsening pain at or near the incision site. MAKE SURE YOU:  Understand these instructions. Will watch your condition. Will get help right away if you are not doing well or get worse. Document Released: 03/17/2007 Document Revised: 01/27/2012 Document Reviewed: 05/18/2007 [facility] Patient Information 2014 [place], [place].   ________________________________________________________________________

## 2024-04-02 NOTE — Progress Notes (Addendum)
 COVID Vaccine Completed: yes  Date of COVID positive in last 90 days:  PCP - Ron Cobbs, MD Cardiologist - Maudine Sos, MD  Clearance by Dr. Robina Chol in media tab dated 03/10/24 Cardiac clearance by Mertha Abrahams, PA 01/28/24 in Epic  Chest x-ray - n/a EKG - 01/28/24 Epic Stress Test - n/a ECHO - 10/27/19 Epic Cardiac Cath - ablation 2021 Pacemaker/ICD device last checked: n/a Spinal Cord Stimulator: n/a  Bowel Prep - no  Sleep Study - n/a CPAP -   Fasting Blood Sugar - n/a Checks Blood Sugar _____ times a day  Last dose of GLP1 agonist-  N/A GLP1 instructions:  Hold 7 days before surgery    Last dose of SGLT-2 inhibitors-  N/A SGLT-2 instructions:  Hold 3 days before surgery    Blood Thinner Instructions:  Last dose: n/a  Time: Aspirin Instructions: Last Dose:  Activity level: Can go up a flight of stairs and perform activities of daily living without stopping and without symptoms of chest pain or shortness of breath.   Anesthesia review: atherosclerosis of aorta, HTN, SVT  Patient denies shortness of breath, fever, cough and chest pain at PAT appointment  Patient verbalized understanding of instructions that were given to them at the PAT appointment. Patient was also instructed that they will need to review over the PAT instructions again at home before surgery.

## 2024-04-05 ENCOUNTER — Other Ambulatory Visit: Payer: Self-pay

## 2024-04-05 ENCOUNTER — Encounter (HOSPITAL_COMMUNITY): Payer: Self-pay

## 2024-04-05 ENCOUNTER — Encounter (HOSPITAL_COMMUNITY)
Admission: RE | Admit: 2024-04-05 | Discharge: 2024-04-05 | Disposition: A | Discharge status: Home / Self Care | Source: Ambulatory Visit | Attending: Orthopedic Surgery | Admitting: Orthopedic Surgery

## 2024-04-05 VITALS — BP 160/81 | HR 68 | Temp 98.7°F | Resp 18 | Ht 61.0 in | Wt 216.0 lb

## 2024-04-05 DIAGNOSIS — M1712 Unilateral primary osteoarthritis, left knee: Secondary | ICD-10-CM | POA: Insufficient documentation

## 2024-04-05 DIAGNOSIS — Z87891 Personal history of nicotine dependence: Secondary | ICD-10-CM | POA: Diagnosis not present

## 2024-04-05 DIAGNOSIS — Z01812 Encounter for preprocedural laboratory examination: Secondary | ICD-10-CM | POA: Insufficient documentation

## 2024-04-05 DIAGNOSIS — Z01818 Encounter for other preprocedural examination: Secondary | ICD-10-CM

## 2024-04-05 DIAGNOSIS — I10 Essential (primary) hypertension: Secondary | ICD-10-CM

## 2024-04-05 HISTORY — DX: Anxiety disorder, unspecified: F41.9

## 2024-04-05 LAB — BASIC METABOLIC PANEL WITH GFR
Anion gap: 10 (ref 5–15)
BUN: 24 mg/dL — ABNORMAL HIGH (ref 8–23)
CO2: 24 mmol/L (ref 22–32)
Calcium: 9.9 mg/dL (ref 8.9–10.3)
Chloride: 101 mmol/L (ref 98–111)
Creatinine, Ser: 0.75 mg/dL (ref 0.44–1.00)
GFR, Estimated: >60 mL/min (ref >60)
Glucose, Bld: 105 mg/dL — ABNORMAL HIGH (ref 70–99)
Potassium: 4.1 mmol/L (ref 3.5–5.1)
Sodium: 135 mmol/L (ref 135–145)

## 2024-04-05 LAB — SURGICAL PCR SCREEN
MRSA, PCR: NEGATIVE
Staphylococcus aureus: POSITIVE — AB

## 2024-04-05 LAB — CBC
HCT: 43.7 % (ref 36.0–46.0)
Hemoglobin: 13.9 g/dL (ref 12.0–15.0)
MCH: 29 pg (ref 26.0–34.0)
MCHC: 31.8 g/dL (ref 30.0–36.0)
MCV: 91.2 fL (ref 80.0–100.0)
Platelets: 296 10*3/uL (ref 150–400)
RBC: 4.79 MIL/uL (ref 3.87–5.11)
RDW: 13.5 % (ref 11.5–15.5)
WBC: 11.2 10*3/uL — ABNORMAL HIGH (ref 4.0–10.5)
nRBC: 0 % (ref 0.0–0.2)

## 2024-04-06 ENCOUNTER — Encounter: Payer: Self-pay | Admitting: Family Medicine

## 2024-04-06 NOTE — Progress Notes (Signed)
 Anesthesia Chart Review   Case: [id number] Date/Time: 04/16/24 0948   Procedure: ARTHROPLASTY, KNEE, TOTAL (Left: Knee)   Anesthesia type: Spinal   Pre-op diagnosis: Left knee osteoarthritis   Location: [facility] ROOM 06 / [facility]   Surgeons: Winston Hawking, MD       DISCUSSION:67 y.o. former smoker with h/o HTN, SVT s/p ablation 02/2020, thyroid  disease, left knee OA scheduled for above procedure 04/16/24 with Dr. Winston Hawking.   Pt last seen by cardiology 01/28/24. Per OV note, "Pre-op cardiac evaluation Low cardiac risk surgery Low cardiac risk score Excellent exertional capacity, exercising regularly No cardiac contraindications to knee surgery"  Clearance received from PCP which states pt is cleared for procedure, low risk.   VS: BP (!) 160/81   Pulse 68   Temp 37.1 C (Oral)   Resp 18   Ht 5\' 1"  (1.549 m)   Wt 98 kg   LMP 11/18/2010   SpO2 98%   BMI 40.81 kg/m   PROVIDERS: Watson Hacking, MD is PCP   Cardiologist - Maudine Sos, MD   LABS: Labs reviewed: Acceptable for surgery. (all labs ordered are listed, but only abnormal results are displayed)  Labs Reviewed  SURGICAL PCR SCREEN - Abnormal; Notable for the following components:      Result Value   Staphylococcus aureus POSITIVE (*)    All other components within normal limits  BASIC METABOLIC PANEL WITH GFR - Abnormal; Notable for the following components:   Glucose, Bld 105 (*)    BUN 24 (*)    All other components within normal limits  CBC - Abnormal; Notable for the following components:   WBC 11.2 (*)    All other components within normal limits     IMAGES:   EKG:   CV:  Past Medical History:  Diagnosis Date   Allergy    RHINITIS   Anxiety    mild   Arthritis    Diverticulitis    Former smoker    Hypercholesteremia    Hypertension    Obesity    SVT (supraventricular tachycardia) (HCC) 02/2020   SVT (supraventricular tachycardia) (HCC) 10/27/2019   Thyroid  disease     Past Surgical  History:  Procedure Laterality Date   COLONOSCOPY  2007/2008   Dr. Howard Macho   KNEE ARTHROSCOPY  2012   Right knee   SVT ABLATION N/A 03/01/2020   Procedure: SVT ABLATION;  Surgeon: Lei Pump, MD;  Location: [facility] INVASIVE CV LAB;  Service: Cardiovascular;  Laterality: N/A;   WISDOM TOOTH EXTRACTION      MEDICATIONS:  acetaminophen  (TYLENOL ) 500 MG tablet   ALPRAZolam  (XANAX ) 0.25 MG tablet   atorvastatin  (LIPITOR) 20 MG tablet   b complex vitamins capsule   [place]-Glucosamine-Vit D (OSTEO BI-FLEX ONE PER DAY) TABS   Calcium  Carb-Cholecalciferol  (CALCIUM +D3 PO)   calcium  elemental as carbonate (BARIATRIC TUMS ULTRA) 400 MG chewable tablet   Carboxymethylcellul-Glycerin (LUBRICATING EYE DROPS OP)   cholecalciferol  (VITAMIN D3) 25 MCG (1000 UNIT) tablet   diclofenac sodium (VOLTAREN) 1 % GEL   Eszopiclone  3 MG TABS   fluticasone  (FLONASE ) 50 MCG/ACT nasal spray   Homeopathic Products (THERAWORX RELIEF EX)   hydrocortisone  cream 1 %   levothyroxine  (SYNTHROID ) 112 MCG tablet   lisinopril -hydrochlorothiazide  (ZESTORETIC ) 20-12.5 MG tablet   loratadine (CLARITIN) 10 MG tablet   Magnesium  400 MG CAPS   Multiple Vitamins-Minerals (WOMENS 50+ MULTI VITAMIN/MIN) TABS   phentermine  15 MG capsule   Potassium Gluconate 550 (90 K) MG TABS  valACYclovir  (VALTREX ) 1000 MG tablet   No current facility-administered medications for this encounter.   Chick Cotton Ward, PA-C [facility] Pre-Surgical Testing [phone]

## 2024-04-11 ENCOUNTER — Other Ambulatory Visit: Payer: Self-pay | Admitting: Family Medicine

## 2024-04-11 DIAGNOSIS — F419 Anxiety disorder, unspecified: Secondary | ICD-10-CM

## 2024-04-15 NOTE — Anesthesia Preprocedure Evaluation (Signed)
 Anesthesia Evaluation  Patient identified by MRN, date of birth, ID band Patient awake    Reviewed: Allergy & Precautions, NPO status , Patient's Chart, lab work & pertinent test results  Airway Mallampati: III  TM Distance: >3 FB Neck ROM: Full    Dental no notable dental hx. (+) Dental Advisory Given, Teeth Intact   Pulmonary former smoker   Pulmonary exam normal breath sounds clear to auscultation       Cardiovascular hypertension, Pt. on medications Normal cardiovascular exam Rhythm:Regular Rate:Normal  Echo 2020  1. Left ventricular ejection fraction, by visual estimation, is 60 to 65%. The left ventricle has normal function. There is no left ventricular hypertrophy.   2. The left ventricle has no regional wall motion abnormalities.   3. Global right ventricle has normal systolic function.The right ventricular size is normal. No increase in right ventricular wall thickness.   4. Left atrial size was normal.   5. Right atrial size was normal.   6. Presence of pericardial fat pad.   7. Trivial pericardial effusion is present.   8. The mitral valve is grossly normal. Trivial mitral valve regurgitation.   9. The tricuspid valve is grossly normal. Tricuspid valve regurgitation is trivial.  10. The aortic valve is tricuspid. Aortic valve regurgitation is not visualized. No evidence of aortic valve sclerosis or stenosis.  11. The pulmonic valve was grossly normal. Pulmonic valve regurgitation is trivial.  12. Normal pulmonary artery systolic pressure.  13. The tricuspid regurgitant velocity is 2.28 m/s, and with an assumed right atrial pressure of 3 mmHg, the estimated right ventricular systolic pressure is normal at 23.7 mmHg.  14. The inferior vena cava is normal in size with greater than 50% respiratory variability, suggesting right atrial pressure of 3 mmHg.  15. No prior Echocardiogram.     Neuro/Psych  PSYCHIATRIC DISORDERS  Anxiety     negative neurological ROS     GI/Hepatic negative GI ROS, Neg liver ROS,,,  Endo/Other  Hypothyroidism  Class 3 obesity  Renal/GU negative Renal ROS     Musculoskeletal  (+) Arthritis ,    Abdominal  (+) + obese  Peds  Hematology negative hematology ROS (+)   Anesthesia Other Findings   Reproductive/Obstetrics                             Anesthesia Physical Anesthesia Plan  ASA: 3  Anesthesia Plan: Spinal   Post-op Pain Management: Tylenol  PO (pre-op)*, Gabapentin PO (pre-op)* and Regional block*   Induction: Intravenous  PONV Risk Score and Plan: 3 and Ondansetron , Dexamethasone, Treatment may vary due to age or medical condition, Propofol infusion and TIVA  Airway Management Planned: Natural Airway  Additional Equipment:   Intra-op Plan:   Post-operative Plan:   Informed Consent: I have reviewed the patients History and Physical, chart, labs and discussed the procedure including the risks, benefits and alternatives for the proposed anesthesia with the patient or authorized representative who has indicated his/her understanding and acceptance.     Dental advisory given  Plan Discussed with: CRNA  Anesthesia Plan Comments: (Risks of anesthesia explained at length. This includes, but is not limited to, sore throat, damage to teeth, lips gums, tongue and vocal cords, nausea and vomiting, reactions to medications, stroke, heart attack, and death. All patient questions were answered and the patient wishes to proceed. Risks of peripheral nerve block explained at length. This includes, but is not limited to, bleeding, infection,  reactions to the medications, seizures, damage to surrounding structures, damage to nerves, permanent weakness, numbness, tingling and pain. All patient questions were answered and patient wishes to proceed with nerve block. Risks of spinal anesthesia explained at length. This includes, but is not limited to,  bleeding, infection, reactions to the medications, seizures, headache, damage to surrounding structures, damage to nerves, permanent weakness, numbness, tingling and pain. All patient questions were answered and patient wishes to proceed with spinal anesthesia.  )        Anesthesia Quick Evaluation

## 2024-04-16 ENCOUNTER — Other Ambulatory Visit: Payer: Self-pay

## 2024-04-16 ENCOUNTER — Encounter (HOSPITAL_COMMUNITY)
Admission: RE | Disposition: A | Discharge status: Home / Self Care | Payer: Self-pay | Source: Ambulatory Visit | Attending: Orthopedic Surgery

## 2024-04-16 ENCOUNTER — Observation Stay (HOSPITAL_COMMUNITY)
Admission: RE | Admit: 2024-04-16 | Discharge: 2024-04-17 | Disposition: A | Discharge status: Home / Self Care | Source: Ambulatory Visit | Attending: Orthopedic Surgery | Admitting: Orthopedic Surgery

## 2024-04-16 ENCOUNTER — Encounter (HOSPITAL_COMMUNITY): Payer: Self-pay | Admitting: Orthopedic Surgery

## 2024-04-16 ENCOUNTER — Ambulatory Visit (HOSPITAL_BASED_OUTPATIENT_CLINIC_OR_DEPARTMENT_OTHER): Payer: Self-pay | Admitting: Anesthesiology

## 2024-04-16 ENCOUNTER — Ambulatory Visit (HOSPITAL_COMMUNITY): Payer: Self-pay | Admitting: Physician Assistant

## 2024-04-16 DIAGNOSIS — E039 Hypothyroidism, unspecified: Secondary | ICD-10-CM | POA: Diagnosis not present

## 2024-04-16 DIAGNOSIS — Z87891 Personal history of nicotine dependence: Secondary | ICD-10-CM | POA: Diagnosis not present

## 2024-04-16 DIAGNOSIS — M1712 Unilateral primary osteoarthritis, left knee: Secondary | ICD-10-CM

## 2024-04-16 DIAGNOSIS — I10 Essential (primary) hypertension: Secondary | ICD-10-CM

## 2024-04-16 DIAGNOSIS — Z96652 Presence of left artificial knee joint: Principal | ICD-10-CM

## 2024-04-16 DIAGNOSIS — G8918 Other acute postprocedural pain: Secondary | ICD-10-CM | POA: Diagnosis not present

## 2024-04-16 DIAGNOSIS — Z79899 Other long term (current) drug therapy: Secondary | ICD-10-CM | POA: Insufficient documentation

## 2024-04-16 HISTORY — PX: TOTAL KNEE ARTHROPLASTY: SHX125

## 2024-04-16 SURGERY — ARTHROPLASTY, KNEE, TOTAL
Anesthesia: Spinal | Site: Knee | Laterality: Left

## 2024-04-16 MED ORDER — METOCLOPRAMIDE HCL 5 MG PO TABS: 5.0000 mg | ORAL_TABLET | Freq: Three times a day (TID) | ORAL | Status: DC | PRN

## 2024-04-16 MED ORDER — POLYVINYL ALCOHOL 1.4 % OP SOLN
1.0000 [drp] | Freq: Every day | OPHTHALMIC | Status: DC
  Administered 2024-04-17: 1 [drp] via OPHTHALMIC
  Filled 2024-04-16: qty 15

## 2024-04-16 MED ORDER — CALCIUM CARBONATE ANTACID 500 MG PO CHEW: 1000.0000 mg | CHEWABLE_TABLET | Freq: Three times a day (TID) | ORAL | Status: DC | PRN

## 2024-04-16 MED ORDER — ASPIRIN 81 MG PO CHEW
81.0000 mg | CHEWABLE_TABLET | Freq: Two times a day (BID) | ORAL | Status: DC
  Administered 2024-04-16 – 2024-04-17 (×2): 81 mg via ORAL
  Filled 2024-04-16 (×2): qty 1

## 2024-04-16 MED ORDER — BUPIVACAINE-EPINEPHRINE 0.25% -1:200000 IJ SOLN
INTRAMUSCULAR | Status: DC | PRN
  Administered 2024-04-16: 30 mL

## 2024-04-16 MED ORDER — OXYCODONE-ACETAMINOPHEN 5-325 MG PO TABS: 1.0000 | ORAL_TABLET | ORAL | 0 refills | Status: DC | PRN

## 2024-04-16 MED ORDER — DOCUSATE SODIUM 100 MG PO CAPS
100.0000 mg | ORAL_CAPSULE | Freq: Two times a day (BID) | ORAL | Status: DC
  Administered 2024-04-16 – 2024-04-17 (×2): 100 mg via ORAL
  Filled 2024-04-16 (×2): qty 1

## 2024-04-16 MED ORDER — HYDROCHLOROTHIAZIDE 12.5 MG PO TABS
12.5000 mg | ORAL_TABLET | Freq: Every day | ORAL | Status: DC
  Administered 2024-04-16 – 2024-04-17 (×2): 12.5 mg via ORAL
  Filled 2024-04-16 (×2): qty 1

## 2024-04-16 MED ORDER — ONDANSETRON HCL 4 MG PO TABS: 4.0000 mg | ORAL_TABLET | Freq: Three times a day (TID) | ORAL | 1 refills | Status: DC | PRN

## 2024-04-16 MED ORDER — ALBUMIN HUMAN 5 % IV SOLN: INTRAVENOUS | Status: DC | PRN

## 2024-04-16 MED ORDER — ACETAMINOPHEN 500 MG PO TABS: 1000.0000 mg | ORAL_TABLET | Freq: Once | ORAL | Status: DC

## 2024-04-16 MED ORDER — LORATADINE 10 MG PO TABS: 10.0000 mg | ORAL_TABLET | ORAL | Status: DC | PRN

## 2024-04-16 MED ORDER — PROPOFOL 500 MG/50ML IV EMUL
INTRAVENOUS | Status: DC | PRN
  Administered 2024-04-16: 40 mg via INTRAVENOUS
  Administered 2024-04-16: 30 mg via INTRAVENOUS
  Administered 2024-04-16: 40 mg via INTRAVENOUS
  Administered 2024-04-16: 100 ug/kg/min via INTRAVENOUS
  Administered 2024-04-16: 10 mg via INTRAVENOUS

## 2024-04-16 MED ORDER — VALACYCLOVIR HCL 500 MG PO TABS: 1000.0000 mg | ORAL_TABLET | Freq: Two times a day (BID) | ORAL | Status: DC | PRN

## 2024-04-16 MED ORDER — MIDAZOLAM HCL 2 MG/2ML IJ SOLN
1.0000 mg | INTRAMUSCULAR | Status: DC | PRN
  Administered 2024-04-16: 1 mg via INTRAVENOUS
  Filled 2024-04-16: qty 2

## 2024-04-16 MED ORDER — B COMPLEX-C PO TABS
1.0000 | ORAL_TABLET | Freq: Every day | ORAL | Status: DC
  Administered 2024-04-16 – 2024-04-17 (×2): 1 via ORAL
  Filled 2024-04-16 (×2): qty 1

## 2024-04-16 MED ORDER — HYDROCORTISONE 1 % EX CREA: 1.0000 | TOPICAL_CREAM | Freq: Every day | CUTANEOUS | Status: DC | PRN

## 2024-04-16 MED ORDER — SODIUM CHLORIDE (PF) 0.9 % IJ SOLN
INTRAMUSCULAR | Status: AC
  Filled 2024-04-16: qty 30

## 2024-04-16 MED ORDER — SODIUM CHLORIDE 0.9% FLUSH: 3.0000 mL | INTRAVENOUS | Status: DC | PRN

## 2024-04-16 MED ORDER — POVIDONE-IODINE 10 % EX SWAB
2.0000 | Freq: Once | CUTANEOUS | Status: AC
  Administered 2024-04-16: 2 via TOPICAL

## 2024-04-16 MED ORDER — HYDROMORPHONE HCL 1 MG/ML IJ SOLN: 0.2500 mg | INTRAMUSCULAR | Status: DC | PRN

## 2024-04-16 MED ORDER — OXYCODONE HCL 5 MG PO TABS
5.0000 mg | ORAL_TABLET | ORAL | Status: DC | PRN
  Administered 2024-04-16 – 2024-04-17 (×5): 10 mg via ORAL
  Filled 2024-04-16 (×5): qty 2

## 2024-04-16 MED ORDER — MUPIROCIN 2 % EX OINT: 1.0000 | TOPICAL_OINTMENT | Freq: Two times a day (BID) | CUTANEOUS | 0 refills | Status: DC

## 2024-04-16 MED ORDER — MAGNESIUM 400 MG PO CAPS: 400.0000 mg | ORAL_CAPSULE | Freq: Every day | ORAL | Status: DC

## 2024-04-16 MED ORDER — BUPIVACAINE LIPOSOME 1.3 % IJ SUSP: 20.0000 mL | Freq: Once | INTRAMUSCULAR | Status: DC

## 2024-04-16 MED ORDER — CEFAZOLIN SODIUM-DEXTROSE 2-4 GM/100ML-% IV SOLN
2.0000 g | INTRAVENOUS | Status: AC
  Administered 2024-04-16: 2 g via INTRAVENOUS
  Filled 2024-04-16: qty 100

## 2024-04-16 MED ORDER — VITAMIN D 25 MCG (1000 UNIT) PO TABS
1000.0000 [IU] | ORAL_TABLET | Freq: Every day | ORAL | Status: DC
  Administered 2024-04-17: 1000 [IU] via ORAL
  Filled 2024-04-16: qty 1

## 2024-04-16 MED ORDER — CEFAZOLIN SODIUM-DEXTROSE 2-4 GM/100ML-% IV SOLN
2.0000 g | Freq: Four times a day (QID) | INTRAVENOUS | Status: AC
  Administered 2024-04-16 (×2): 2 g via INTRAVENOUS
  Filled 2024-04-16 (×2): qty 100

## 2024-04-16 MED ORDER — EPHEDRINE SULFATE-NACL 50-0.9 MG/10ML-% IV SOSY
PREFILLED_SYRINGE | INTRAVENOUS | Status: DC | PRN
  Administered 2024-04-16: 5 mg via INTRAVENOUS

## 2024-04-16 MED ORDER — DEXAMETHASONE SODIUM PHOSPHATE 10 MG/ML IJ SOLN
INTRAMUSCULAR | Status: DC | PRN
  Administered 2024-04-16: 10 mg via INTRAVENOUS

## 2024-04-16 MED ORDER — ADULT MULTIVITAMIN W/MINERALS CH
1.0000 | ORAL_TABLET | Freq: Every day | ORAL | Status: DC
  Administered 2024-04-17: 1 via ORAL
  Filled 2024-04-16: qty 1

## 2024-04-16 MED ORDER — B COMPLEX VITAMINS PO CAPS: 1.0000 | ORAL_CAPSULE | Freq: Every day | ORAL | Status: DC

## 2024-04-16 MED ORDER — PHENYLEPHRINE HCL-NACL 20-0.9 MG/250ML-% IV SOLN
INTRAVENOUS | Status: DC | PRN
  Administered 2024-04-16: 25 ug/min via INTRAVENOUS

## 2024-04-16 MED ORDER — SODIUM CHLORIDE (PF) 0.9 % IJ SOLN
INTRAMUSCULAR | Status: DC | PRN
  Administered 2024-04-16: 30 mL

## 2024-04-16 MED ORDER — ATORVASTATIN CALCIUM 20 MG PO TABS
20.0000 mg | ORAL_TABLET | Freq: Every day | ORAL | Status: DC
  Administered 2024-04-17: 20 mg via ORAL
  Filled 2024-04-16: qty 1

## 2024-04-16 MED ORDER — DEXAMETHASONE SODIUM PHOSPHATE 10 MG/ML IJ SOLN
INTRAMUSCULAR | Status: AC
Start: 2024-04-16 — End: ?
  Filled 2024-04-16: qty 1

## 2024-04-16 MED ORDER — SODIUM CHLORIDE 0.9% FLUSH
3.0000 mL | Freq: Two times a day (BID) | INTRAVENOUS | Status: DC
  Administered 2024-04-16: 10 mL via INTRAVENOUS

## 2024-04-16 MED ORDER — TRANEXAMIC ACID-NACL 1000-0.7 MG/100ML-% IV SOLN
1000.0000 mg | Freq: Once | INTRAVENOUS | Status: AC
  Administered 2024-04-16: 1000 mg via INTRAVENOUS
  Filled 2024-04-16: qty 100

## 2024-04-16 MED ORDER — HYDROMORPHONE HCL 1 MG/ML IJ SOLN: 0.5000 mg | INTRAMUSCULAR | Status: DC | PRN

## 2024-04-16 MED ORDER — MAGNESIUM OXIDE -MG SUPPLEMENT 400 (240 MG) MG PO TABS
400.0000 mg | ORAL_TABLET | Freq: Every day | ORAL | Status: DC
  Administered 2024-04-16 – 2024-04-17 (×2): 400 mg via ORAL
  Filled 2024-04-16 (×2): qty 1

## 2024-04-16 MED ORDER — MENTHOL 3 MG MT LOZG: 1.0000 | LOZENGE | OROMUCOSAL | Status: DC | PRN

## 2024-04-16 MED ORDER — LISINOPRIL 20 MG PO TABS
20.0000 mg | ORAL_TABLET | Freq: Every day | ORAL | Status: DC
  Administered 2024-04-16 – 2024-04-17 (×2): 20 mg via ORAL
  Filled 2024-04-16 (×2): qty 1

## 2024-04-16 MED ORDER — POTASSIUM CHLORIDE CRYS ER 10 MEQ PO TBCR
10.0000 meq | EXTENDED_RELEASE_TABLET | Freq: Every day | ORAL | Status: DC
  Administered 2024-04-17: 10 meq via ORAL
  Filled 2024-04-16: qty 1

## 2024-04-16 MED ORDER — METHOCARBAMOL 1000 MG/10ML IJ SOLN: 500.0000 mg | Freq: Four times a day (QID) | INTRAMUSCULAR | Status: DC | PRN

## 2024-04-16 MED ORDER — DICLOFENAC SODIUM 1 % TD GEL: 2.0000 g | Freq: Two times a day (BID) | TRANSDERMAL | Status: DC | PRN

## 2024-04-16 MED ORDER — TRANEXAMIC ACID-NACL 1000-0.7 MG/100ML-% IV SOLN
1000.0000 mg | INTRAVENOUS | Status: AC
  Administered 2024-04-16: 1000 mg via INTRAVENOUS
  Filled 2024-04-16: qty 100

## 2024-04-16 MED ORDER — LISINOPRIL-HYDROCHLOROTHIAZIDE 20-12.5 MG PO TABS: 1.0000 | ORAL_TABLET | Freq: Every day | ORAL | Status: DC

## 2024-04-16 MED ORDER — POLYETHYLENE GLYCOL 3350 17 G PO PACK: 17.0000 g | PACK | Freq: Every day | ORAL | Status: DC | PRN

## 2024-04-16 MED ORDER — ONDANSETRON HCL 4 MG/2ML IJ SOLN
4.0000 mg | Freq: Four times a day (QID) | INTRAMUSCULAR | Status: DC | PRN
Start: 2024-04-16 — End: 2024-04-17

## 2024-04-16 MED ORDER — ORAL CARE MOUTH RINSE: 15.0000 mL | Freq: Once | OROMUCOSAL | Status: AC

## 2024-04-16 MED ORDER — ALPRAZOLAM 0.25 MG PO TABS: 0.2500 mg | ORAL_TABLET | Freq: Two times a day (BID) | ORAL | Status: DC | PRN

## 2024-04-16 MED ORDER — SODIUM CHLORIDE 0.9 % IR SOLN
Status: DC | PRN
  Administered 2024-04-16: 1000 mL

## 2024-04-16 MED ORDER — OSTEO BI-FLEX ONE PER DAY PO TABS: 1.0000 | ORAL_TABLET | Freq: Every day | ORAL | Status: DC

## 2024-04-16 MED ORDER — BISACODYL 10 MG RE SUPP: 10.0000 mg | Freq: Every day | RECTAL | Status: DC | PRN

## 2024-04-16 MED ORDER — METHOCARBAMOL 500 MG PO TABS
500.0000 mg | ORAL_TABLET | Freq: Three times a day (TID) | ORAL | 1 refills | Status: DC | PRN
Start: 2024-04-16 — End: 2024-04-17

## 2024-04-16 MED ORDER — PHENOL 1.4 % MT LIQD: 1.0000 | OROMUCOSAL | Status: DC | PRN

## 2024-04-16 MED ORDER — GABAPENTIN 300 MG PO CAPS
300.0000 mg | ORAL_CAPSULE | Freq: Once | ORAL | Status: AC
  Administered 2024-04-16: 300 mg via ORAL
  Filled 2024-04-16: qty 1

## 2024-04-16 MED ORDER — ONDANSETRON HCL 4 MG PO TABS: 4.0000 mg | ORAL_TABLET | Freq: Four times a day (QID) | ORAL | Status: DC | PRN

## 2024-04-16 MED ORDER — LACTATED RINGERS IV SOLN: INTRAVENOUS | Status: DC

## 2024-04-16 MED ORDER — LEVOTHYROXINE SODIUM 112 MCG PO TABS
112.0000 ug | ORAL_TABLET | Freq: Every day | ORAL | Status: DC
  Administered 2024-04-17: 112 ug via ORAL
  Filled 2024-04-16: qty 1

## 2024-04-16 MED ORDER — OYSTER SHELL CALCIUM/D3 500-5 MG-MCG PO TABS
1.0000 | ORAL_TABLET | Freq: Every day | ORAL | Status: DC
  Administered 2024-04-16 – 2024-04-17 (×2): 1 via ORAL
  Filled 2024-04-16 (×2): qty 1

## 2024-04-16 MED ORDER — METHOCARBAMOL 500 MG PO TABS
500.0000 mg | ORAL_TABLET | Freq: Four times a day (QID) | ORAL | Status: DC | PRN
  Administered 2024-04-16 – 2024-04-17 (×3): 500 mg via ORAL
  Filled 2024-04-16 (×3): qty 1

## 2024-04-16 MED ORDER — ACETAMINOPHEN 500 MG PO TABS
1000.0000 mg | ORAL_TABLET | Freq: Once | ORAL | Status: AC
  Administered 2024-04-16: 500 mg via ORAL
  Filled 2024-04-16: qty 2

## 2024-04-16 MED ORDER — PROPOFOL 1000 MG/100ML IV EMUL
INTRAVENOUS | Status: AC
Start: 2024-04-16 — End: ?
  Filled 2024-04-16: qty 100

## 2024-04-16 MED ORDER — ACETAMINOPHEN 325 MG PO TABS: 325.0000 mg | ORAL_TABLET | Freq: Four times a day (QID) | ORAL | Status: DC | PRN

## 2024-04-16 MED ORDER — ONDANSETRON HCL 4 MG/2ML IJ SOLN
INTRAMUSCULAR | Status: DC | PRN
Start: 2024-04-16 — End: 2024-04-16
  Administered 2024-04-16: 4 mg via INTRAVENOUS

## 2024-04-16 MED ORDER — METOCLOPRAMIDE HCL 5 MG/ML IJ SOLN: 5.0000 mg | Freq: Three times a day (TID) | INTRAMUSCULAR | Status: DC | PRN

## 2024-04-16 MED ORDER — FLUTICASONE PROPIONATE 50 MCG/ACT NA SUSP: 1.0000 | Freq: Every day | NASAL | Status: DC | PRN

## 2024-04-16 MED ORDER — EPHEDRINE 5 MG/ML INJ
INTRAVENOUS | Status: AC
  Filled 2024-04-16: qty 5

## 2024-04-16 MED ORDER — ALBUMIN HUMAN 5 % IV SOLN
INTRAVENOUS | Status: AC
Start: 2024-04-16 — End: ?
  Filled 2024-04-16: qty 250

## 2024-04-16 MED ORDER — PROPOFOL 10 MG/ML IV BOLUS
INTRAVENOUS | Status: AC
  Filled 2024-04-16: qty 20

## 2024-04-16 MED ORDER — BUPIVACAINE-EPINEPHRINE (PF) 0.25% -1:200000 IJ SOLN
INTRAMUSCULAR | Status: AC
Start: 2024-04-16 — End: ?
  Filled 2024-04-16: qty 30

## 2024-04-16 MED ORDER — FENTANYL CITRATE PF 50 MCG/ML IJ SOSY
50.0000 ug | PREFILLED_SYRINGE | INTRAMUSCULAR | Status: DC | PRN
  Administered 2024-04-16: 50 ug via INTRAVENOUS
  Filled 2024-04-16: qty 2

## 2024-04-16 MED ORDER — 0.9 % SODIUM CHLORIDE (POUR BTL) OPTIME
TOPICAL | Status: DC | PRN
  Administered 2024-04-16: 1000 mL

## 2024-04-16 MED ORDER — POTASSIUM GLUCONATE 550 (90 K) MG PO TABS: 1.0000 | ORAL_TABLET | Freq: Every day | ORAL | Status: DC

## 2024-04-16 MED ORDER — ONDANSETRON HCL 4 MG/2ML IJ SOLN
INTRAMUSCULAR | Status: AC
  Filled 2024-04-16: qty 2

## 2024-04-16 MED ORDER — CHLORHEXIDINE GLUCONATE 4 % EX SOLN: 1.0000 | CUTANEOUS | 1 refills | Status: DC

## 2024-04-16 MED ORDER — BUPIVACAINE LIPOSOME 1.3 % IJ SUSP
INTRAMUSCULAR | Status: AC
  Filled 2024-04-16: qty 20

## 2024-04-16 MED ORDER — WATER FOR IRRIGATION, STERILE IR SOLN
Status: DC | PRN
Start: 2024-04-16 — End: 2024-04-16
  Administered 2024-04-16: 2000 mL

## 2024-04-16 MED ORDER — BUPIVACAINE LIPOSOME 1.3 % IJ SUSP
INTRAMUSCULAR | Status: DC | PRN
Start: 2024-04-16 — End: 2024-04-16
  Administered 2024-04-16: 20 mL

## 2024-04-16 MED ORDER — BUPIVACAINE IN DEXTROSE 0.75-8.25 % IT SOLN
INTRATHECAL | Status: DC | PRN
Start: 2024-04-16 — End: 2024-04-16
  Administered 2024-04-16: 1.6 mL via INTRATHECAL

## 2024-04-16 MED ORDER — CHLORHEXIDINE GLUCONATE 0.12 % MT SOLN
15.0000 mL | Freq: Once | OROMUCOSAL | Status: AC
  Administered 2024-04-16: 15 mL via OROMUCOSAL

## 2024-04-16 MED ORDER — POLYETHYL GLYCOL-PROPYL GLYCOL 0.4-0.3 % OP SOLN: Freq: Every day | OPHTHALMIC | Status: DC

## 2024-04-16 MED ORDER — DROPERIDOL 2.5 MG/ML IJ SOLN: 0.6250 mg | Freq: Once | INTRAMUSCULAR | Status: DC | PRN

## 2024-04-16 SURGICAL SUPPLY — 45 items
ATTUNE MED DOME PAT 38 KNEE (Knees) IMPLANT
ATTUNE PS FEM LT SZ 5 CEM KNEE (Femur) IMPLANT
BAG COUNTER SPONGE SURGICOUNT (BAG) IMPLANT
BAG ZIPLOCK 12X15 (MISCELLANEOUS) ×1 IMPLANT
BASE TIBIA ATTUNE KNEE SYS SZ6 (Knees) IMPLANT
BLADE SAG 18X100X1.27 (BLADE) ×1 IMPLANT
BLADE SAW SGTL 13X75X1.27 (BLADE) ×1 IMPLANT
BNDG ELASTIC 6X10 VLCR STRL LF (GAUZE/BANDAGES/DRESSINGS) ×1 IMPLANT
BNDG GAUZE DERMACEA FLUFF 4 (GAUZE/BANDAGES/DRESSINGS) ×1 IMPLANT
BOWL SMART MIX CTS (DISPOSABLE) ×1 IMPLANT
CEMENT HV SMART SET (Cement) ×2 IMPLANT
COVER SURGICAL LIGHT HANDLE (MISCELLANEOUS) ×1 IMPLANT
CUFF TRNQT CYL 34X4.125X (TOURNIQUET CUFF) ×1 IMPLANT
DRAPE SHEET LG 3/4 BI-LAMINATE (DRAPES) ×1 IMPLANT
DRAPE U-SHAPE 47X51 STRL (DRAPES) ×1 IMPLANT
DRSG ADAPTIC 3X8 NADH LF (GAUZE/BANDAGES/DRESSINGS) ×1 IMPLANT
DURAPREP 26ML APPLICATOR (WOUND CARE) ×1 IMPLANT
ELECT PENCIL ROCKER SW 15FT (MISCELLANEOUS) ×1 IMPLANT
ELECT REM PT RETURN 15FT ADLT (MISCELLANEOUS) ×1 IMPLANT
GAUZE PAD ABD 8X10 STRL (GAUZE/BANDAGES/DRESSINGS) ×1 IMPLANT
GAUZE SPONGE 4X4 12PLY STRL (GAUZE/BANDAGES/DRESSINGS) ×1 IMPLANT
GLOVE BIOGEL PI IND STRL 7.5 (GLOVE) ×1 IMPLANT
GLOVE BIOGEL PI IND STRL 8.5 (GLOVE) ×1 IMPLANT
GLOVE ORTHO TXT STRL SZ7.5 (GLOVE) ×1 IMPLANT
GLOVE SURG ORTHO 8.5 STRL (GLOVE) ×1 IMPLANT
GOWN STRL REUS W/ TWL XL LVL3 (GOWN DISPOSABLE) ×2 IMPLANT
IMMOBILIZER KNEE 20 (SOFTGOODS) ×1 IMPLANT
IMMOBILIZER KNEE 20 THIGH 36 (SOFTGOODS) ×1 IMPLANT
INSERT TIBIAL KNEE SZ 5 12MM (Insert) IMPLANT
KIT TURNOVER KIT A (KITS) ×1 IMPLANT
MANIFOLD NEPTUNE II (INSTRUMENTS) ×1 IMPLANT
NS IRRIG 1000ML POUR BTL (IV SOLUTION) ×1 IMPLANT
PACK TOTAL KNEE CUSTOM (KITS) ×1 IMPLANT
PIN STEINMAN FIXATION KNEE (PIN) IMPLANT
PROTECTOR NERVE ULNAR (MISCELLANEOUS) ×1 IMPLANT
SET HNDPC FAN SPRY TIP SCT (DISPOSABLE) ×1 IMPLANT
STRIP CLOSURE SKIN 1/2X4 (GAUZE/BANDAGES/DRESSINGS) ×2 IMPLANT
SUT MNCRL AB 3-0 PS2 18 (SUTURE) ×1 IMPLANT
SUT VIC AB 0 CT1 36 (SUTURE) ×1 IMPLANT
SUT VIC AB 1 CT1 36 (SUTURE) ×2 IMPLANT
SUT VIC AB 2-0 CT1 TAPERPNT 27 (SUTURE) ×1 IMPLANT
TOWEL GREEN STERILE FF (TOWEL DISPOSABLE) ×1 IMPLANT
TRAY CATH INTERMITTENT SS 16FR (CATHETERS) ×1 IMPLANT
WATER STERILE IRR 1000ML POUR (IV SOLUTION) ×2 IMPLANT
YANKAUER SUCT BULB TIP NO VENT (SUCTIONS) ×1 IMPLANT

## 2024-04-16 NOTE — Transfer of Care (Signed)
 Immediate Anesthesia Transfer of Care Note  Patient: Paula Cline  Procedure(s) Performed: ARTHROPLASTY, KNEE, TOTAL (Left: Knee)  Patient Location: PACU  Anesthesia Type:MAC, Regional, and Spinal  Level of Consciousness: awake, alert , and oriented  Airway & Oxygen  Therapy: Patient Spontanous Breathing  Post-op Assessment: Report given to RN and Post -op Vital signs reviewed and stable  Post vital signs: Reviewed and stable  Last Vitals:  Vitals Value Taken Time  BP 114/63 04/16/24 1209  Temp    Pulse 81 04/16/24 1211  Resp 14 04/16/24 1211  SpO2 97 % 04/16/24 1211  Vitals shown include unfiled device data.  Last Pain:  Vitals:   04/16/24 0945  TempSrc:   PainSc: 0-No pain      Patients Stated Pain Goal: 5 (04/16/24 0809)  Complications: No notable events documented.

## 2024-04-16 NOTE — Anesthesia Procedure Notes (Signed)
 Date/Time: 04/16/2024 10:23 AM  Performed by: Elaina Graver, CRNAOxygen Delivery Method: Simple face mask Placement Confirmation: positive ETCO2 Dental Injury: Teeth and Oropharynx as per pre-operative assessment

## 2024-04-16 NOTE — Progress Notes (Signed)
 Orthopedic Tech Progress Note Patient Details:  Paula Cline 09/10/1956 [id number] CPM will be removed at 1630. CPM Left Knee CPM Left Knee: On Left Knee Flexion (Degrees): 90 Left Knee Extension (Degrees): 0  Post Interventions Patient Tolerated: Well Ortho Devices Type of Ortho Device: Bone foam zero knee Ortho Device/Splint Location: Left knee Ortho Device/Splint Interventions: Application   Post Interventions Patient Tolerated: Well  [name] [name] 04/16/2024, 1:17 PM

## 2024-04-16 NOTE — Anesthesia Postprocedure Evaluation (Signed)
 Anesthesia Post Note  Patient: Paula Cline  Procedure(s) Performed: ARTHROPLASTY, KNEE, TOTAL (Left: Knee)     Patient location during evaluation: PACU Anesthesia Type: Spinal Level of consciousness: awake and alert Pain management: pain level controlled Vital Signs Assessment: post-procedure vital signs reviewed and stable Respiratory status: spontaneous breathing Cardiovascular status: stable Anesthetic complications: no   No notable events documented.  Last Vitals:  Vitals:   04/16/24 1300 04/16/24 1331  BP: 134/79 (!) 146/84  Pulse: 68 71  Resp: 14 18  Temp:  (!) 35.9 C  SpO2: 98% 99%    Last Pain:  Vitals:   04/16/24 1349  TempSrc:   PainSc: 7                  Gorman Laughter

## 2024-04-16 NOTE — Anesthesia Procedure Notes (Signed)
 Spinal  Patient location during procedure: OR Start time: 04/16/2024 10:06 AM End time: 04/16/2024 10:14 AM Reason for block: surgical anesthesia Staffing Performed: anesthesiologist  Anesthesiologist: Gorman Laughter, MD Performed by: Gorman Laughter, MD Authorized by: Gorman Laughter, MD   Preanesthetic Checklist Completed: patient identified, IV checked, site marked, risks and benefits discussed, surgical consent, monitors and equipment checked, pre-op evaluation and timeout performed Spinal Block Patient position: sitting Prep: DuraPrep and site prepped and draped Patient monitoring: heart rate, continuous pulse ox and blood pressure Approach: right paramedian Location: L2-3 Injection technique: single-shot Needle Needle type: Spinocan  Needle gauge: 25 G Needle length: 9 cm Additional Notes Expiration date of kit checked and confirmed. Patient tolerated procedure well, without complications.

## 2024-04-16 NOTE — Discharge Instructions (Signed)
 Ice to the knee constantly.  Keep the incision covered and clean and dry for one week, then ok to get it wet in the shower. You should remove the bandage after one week.   Do exercise as instructed every hour, please to prevent stiffness.    DO NOT prop anything under the knee, it will make your knee stiff.  Prop under the ankle to encourage your knee to go straight.   Use the walker while you are up and around for balance.  Wear your support stockings 24/7 to prevent blood clots and take baby aspirin  twice daily for 30 days also to prevent blood clots  Follow up with Dr Brunilda Capra in two weeks in the office, call [phone] for appt  Please call Dr Brunilda Capra cell phone [phone] with any questions or concerns  INSTRUCTIONS AFTER JOINT REPLACEMENT   Remove items at home which could result in a fall. This includes throw rugs or furniture in walking pathways ICE to the affected joint every three hours while awake for 30 minutes at a time, for at least the first 3-5 days, and then as needed for pain and swelling.  Continue to use ice for pain and swelling. You may notice swelling that will progress down to the foot and ankle.  This is normal after surgery.  Elevate your leg when you are not up walking on it.   Continue to use the breathing machine you got in the hospital (incentive spirometer) which will help keep your temperature down.  It is common for your temperature to cycle up and down following surgery, especially at night when you are not up moving around and exerting yourself.  The breathing machine keeps your lungs expanded and your temperature down.   DIET:  As you were doing prior to hospitalization, we recommend a well-balanced diet.  DRESSING / WOUND CARE / SHOWERING  Leave the Aquacel bandage on for one week then ok to remove it and leave the incision open to air  ACTIVITY  Increase activity slowly as tolerated, but follow the weight bearing instructions below.   No driving  for 6 weeks or until further direction given by your physician.  You cannot drive while taking narcotics.  No lifting or carrying greater than 10 lbs. until further directed by your surgeon. Avoid periods of inactivity such as sitting longer than an hour when not asleep. This helps prevent blood clots.  You may return to work once you are authorized by your doctor.     WEIGHT BEARING   Weight bearing as tolerated with assist device (walker, cane, etc) as directed, use it as long as suggested by your surgeon or therapist, typically at least 4-6 weeks.   EXERCISES  Results after joint replacement surgery are often greatly improved when you follow the exercise, range of motion and muscle strengthening exercises prescribed by your doctor. Safety measures are also important to protect the joint from further injury. Any time any of these exercises cause you to have increased pain or swelling, decrease what you are doing until you are comfortable again and then slowly increase them. If you have problems or questions, call your caregiver or physical therapist for advice.   Rehabilitation is important following a joint replacement. After just a few days of immobilization, the muscles of the leg can become weakened and shrink (atrophy).  These exercises are designed to build up the tone and strength of the thigh and leg muscles and to improve motion. Often times  heat used for twenty to thirty minutes before working out will loosen up your tissues and help with improving the range of motion but do not use heat for the first two weeks following surgery (sometimes heat can increase post-operative swelling).   These exercises can be done on a training (exercise) mat, on the floor, on a table or on a bed. Use whatever works the best and is most comfortable for you.    Use music or television while you are exercising so that the exercises are a pleasant break in your day. This will make your life better with the  exercises acting as a break in your routine that you can look forward to.   Perform all exercises about fifteen times, three times per day or as directed.  You should exercise both the operative leg and the other leg as well.  Exercises include:   Quad Sets - Tighten up the muscle on the front of the thigh (Quad) and hold for 5-10 seconds.   Straight Leg Raises - With your knee straight (if you were given a brace, keep it on), lift the leg to 60 degrees, hold for 3 seconds, and slowly lower the leg.  Perform this exercise against resistance later as your leg gets stronger.  Leg Slides: Lying on your back, slowly slide your foot toward your buttocks, bending your knee up off the floor (only go as far as is comfortable). Then slowly slide your foot back down until your leg is flat on the floor again.  Angel Wings: Lying on your back spread your legs to the side as far apart as you can without causing discomfort.  Hamstring Strength:  Lying on your back, push your heel against the floor with your leg straight by tightening up the muscles of your buttocks.  Repeat, but this time bend your knee to a comfortable angle, and push your heel against the floor.  You may put a pillow under the heel to make it more comfortable if necessary.   A rehabilitation program following joint replacement surgery can speed recovery and prevent re-injury in the future due to weakened muscles. Contact your doctor or a physical therapist for more information on knee rehabilitation.    CONSTIPATION  Constipation is defined medically as fewer than three stools per week and severe constipation as less than one stool per week.  Even if you have a regular bowel pattern at home, your normal regimen is likely to be disrupted due to multiple reasons following surgery.  Combination of anesthesia, postoperative narcotics, change in appetite and fluid intake all can affect your bowels.   YOU MUST use at least one of the following  options; they are listed in order of increasing strength to get the job done.  They are all available over the counter, and you may need to use some, POSSIBLY even all of these options:    Drink plenty of fluids (prune juice may be helpful) and high fiber foods Colace 100 mg by mouth twice a day  Senokot for constipation as directed and as needed Dulcolax (bisacodyl), take with full glass of water  Miralax (polyethylene glycol) once or twice a day as needed.  If you have tried all these things and are unable to have a bowel movement in the first 3-4 days after surgery call either your surgeon or your primary doctor.    If you experience loose stools or diarrhea, hold the medications until you stool forms back up.  If your  symptoms do not get better within 1 week or if they get worse, check with your doctor.  If you experience "the worst abdominal pain ever" or develop nausea or vomiting, please contact the office immediately for further recommendations for treatment.   ITCHING:  If you experience itching with your medications, try taking only a single pain pill, or even half a pain pill at a time.  You can also use Benadryl over the counter for itching or also to help with sleep.   TED HOSE STOCKINGS:  Use stockings on both legs until for at least 2 weeks or as directed by physician office. They may be removed at night for sleeping.  MEDICATIONS:  See your medication summary on the "After Visit Summary" that nursing will review with you.  You may have some home medications which will be placed on hold until you complete the course of blood thinner medication.  It is important for you to complete the blood thinner medication as prescribed.  PRECAUTIONS:  If you experience chest pain or shortness of breath - call 911 immediately for transfer to the hospital emergency department.   If you develop a fever greater that 101 F, purulent drainage from wound, increased redness or drainage from wound, foul  odor from the wound/dressing, or calf pain - CONTACT YOUR SURGEON.                                                   FOLLOW-UP APPOINTMENTS:  If you do not already have a post-op appointment, please call the office for an appointment to be seen by your surgeon.  Guidelines for how soon to be seen are listed in your "After Visit Summary", but are typically between 1-4 weeks after surgery.  OTHER INSTRUCTIONS:   Knee Replacement:  Do not place pillow under knee, focus on keeping the knee straight while resting. CPM instructions: 0-90 degrees, 2 hours in the morning, 2 hours in the afternoon, and 2 hours in the evening. Place foam block, curve side up under heel at all times except when in CPM or when walking.  DO NOT modify, tear, cut, or change the foam block in any way.  POST-OPERATIVE OPIOID TAPER INSTRUCTIONS: It is important to wean off of your opioid medication as soon as possible. If you do not need pain medication after your surgery it is ok to stop day one. Opioids include: Codeine, Hydrocodone(Norco, Vicodin), Oxycodone(Percocet, oxycontin) and hydromorphone amongst others.  Long term and even short term use of opiods can cause: Increased pain response Dependence Constipation Depression Respiratory depression And more.  Withdrawal symptoms can include Flu like symptoms Nausea, vomiting And more Techniques to manage these symptoms Hydrate well Eat regular healthy meals Stay active Use relaxation techniques(deep breathing, meditating, yoga) Do Not substitute Alcohol to help with tapering If you have been on opioids for less than two weeks and do not have pain than it is ok to stop all together.  Plan to wean off of opioids This plan should start within one week post op of your joint replacement. Maintain the same interval or time between taking each dose and first decrease the dose.  Cut the total daily intake of opioids by one tablet each day Next start to increase the time  between doses. The last dose that should be eliminated is the evening dose.  MAKE SURE YOU:  Understand these instructions.  Get help right away if you are not doing well or get worse.    Thank you for letting us  be a part of your medical care team.  It is a privilege we respect greatly.  We hope these instructions will help you stay on track for a fast and full recovery!

## 2024-04-16 NOTE — Anesthesia Procedure Notes (Signed)
 Procedure Name: MAC Date/Time: 04/16/2024 10:00 AM  Performed by: Elaina Graver, CRNAPre-anesthesia Checklist: Patient identified, Emergency Drugs available, Suction available, Patient being monitored and Timeout performed Patient Re-evaluated:Patient Re-evaluated prior to induction Oxygen  Delivery Method: Nasal cannula Preoxygenation: Pre-oxygenation with 100% oxygen  Induction Type: IV induction Placement Confirmation: positive ETCO2 Dental Injury: Teeth and Oropharynx as per pre-operative assessment

## 2024-04-16 NOTE — Anesthesia Procedure Notes (Addendum)
 Anesthesia Regional Block: Adductor canal block   Pre-Anesthetic Checklist: , timeout performed,  Correct Patient, Correct Site, Correct Laterality,  Correct Procedure, Correct Position, site marked,  Risks and benefits discussed,  Surgical consent,  Pre-op evaluation,  At surgeon's request and post-op pain management  Laterality: Lower and Left  Prep: chloraprep       Needles:  Injection technique: Single-shot  Needle Type: Stimiplex     Needle Length: 9cm  Needle Gauge: 21     Additional Needles:   Procedures:,,,, ultrasound used (permanent image in chart),,    Narrative:  Start time: 04/16/2024 9:02 AM End time: 04/16/2024 9:22 AM Injection made incrementally with aspirations every 5 mL.  Performed by: Personally  Anesthesiologist: Gorman Laughter, MD  Additional Notes: BP cuff, EKG monitors applied. Sedation begun. Artery and nerve location verified with ultrasound. Anesthetic injected incrementally (5ml), slowly, and after negative aspirations under direct u/s guidance. Good fascial/perineural spread. Tolerated well.

## 2024-04-16 NOTE — Op Note (Signed)
 NAMESHEMICKA, COHRS MEDICAL RECORD NO: [id number] ACCOUNT NO: [id number] DATE OF BIRTH: [birth year] FACILITY: [facility] LOCATION: [id number] PHYSICIAN: Loreta Rome. Brunilda Capra, MD  Operative Report   DATE OF PROCEDURE: 04/16/2024  PREOPERATIVE DIAGNOSIS:  Left knee end-stage arthritis.  POSTOPERATIVE DIAGNOSIS: Left knee end-stage arthritis.  PROCEDURE PERFORMED:  Left total knee arthroplasty using DePuy Attune prosthesis.  ATTENDING SURGEON:  Loreta Rome. Brunilda Capra, MD  ASSISTANT:  Barton Like Dixon, [place], who was scrubbed during the entire procedure, and necessary for satisfactory completion of surgery.  ANESTHESIA:  Spinal anesthesia plus adductor canal block was utilized.  ESTIMATED BLOOD LOSS:  Minimal.  FLUID REPLACEMENT:  1000 mL crystalloid.  INSTRUMENT COUNTS:  Correct.  COMPLICATIONS:  No complications.  ANTIBIOTICS:  Perioperative antibiotics were given.  INDICATIONS: The patient is a 68 year old female who presents with worsening left knee pain due to end-stage bone-on-bone arthritis.  The patient has significant bone loss on the medial tibia and has had progressive deformity and varus despite  conservative management.  Pain is interfering with mobility and activities of daily living as well as quality of life.  She presents now for operative total knee arthroplasty to restore mechanical alignment and to eliminate pain and restore function.   Informed consent obtained.  DESCRIPTION OF PROCEDURE:  After an adequate level of anesthesia was achieved, the patient was positioned supine on the operating room table.  Left leg correctly identified.  A nonsterile tourniquet was placed on the proximal thigh.  Sterile prep and  drape performed.  Timeout called verifying correct patient and correct site.  We then elevated the leg and exsanguinated with an Esmarch bandage, inflating the tourniquet to 300 mmHg.  We placed the knee in flexion and performed a longitudinal midline  incision with a 10  blade scalpel.  Dissection down through the subcutaneous tissues.  We used a fresh 10 blade scalpel for the medial parapatellar arthrotomy.  We then divided the lateral patellofemoral ligament everting the patella and exposing the  distal femur, which was devoid of cartilage.  We entered the distal femur with a step-cut drill.  We resected 9 mm off the distal femur set on 5 degrees of valgus for this left knee.  We then sized the femur to a size 5 anterior down performing anterior  and posterior and chamfer cuts with a 4-in-1 block.  Next, we removed ACL and PCL meniscal tissues.  We subluxed the tibia anteriorly and then performed a tibial cut with the external jig 90 degrees perpendicular to the long axis of the tibia with  minimal posterior slope for this posterior cruciate substituting prosthesis.  We resected 1 mm off the affected medial side.  There was a pretty significant medial tibial condyle bone defect requiring a significant resection to get our tibial cut.  Once  we had that cut done, we used a laminar spreader to remove posterior femoral condyle osteophytes.  We injected the posterior capsule with a combination of Marcaine , Exparel  and saline for postop pain control.  Next, we checked our gaps, which we used a  size 8 gap checker and we were symmetric both in flexion and extension.  We removed the pins and then completed our tibia preparation for the size 6 tibia.  We placed that center point for the tibial component at the medial third of the tibial tubercle.   Once we had our tibia sized, we did our modular drill and keel punch to complete our tibial preparation.  We next went to the box  cut for the 5 left femur and then we drilled our lug holes for the femoral component.  We placed an 8 spacer initially and  then a 10 spacer, got the knee to full extension, good flexion and stability, feeling like we would probably go with a 12.  We then went ahead and resurfaced the patella.  There was  pretty significant patellar bone loss as well requiring a slightly  angled resection.  We left it a little thicker proximally and then just the wear on the medial side.  We just basically barely got to the depth of the wear distally, but we did have a nice level cut.  We drilled holes for the 38 patellar button.  We then  ranged the knee with the trial button in place and had excellent patellar tracking with no-touch technique.  We removed the trial components.  We irrigated thoroughly.  We dried the bone well.  We vacuum mixed high viscosity cement and cemented the  components in place all in one step, size 6 tibia, size 5 left femur.  We placed a 10-mm poly and then placed the knee in extension for good compression of the bone cement.  We used a patellar clamp for the patella while the bone cement set up.  Once all  the cement was hard on the back table, we removed excess cement with a quarter-inch curved osteotome.  We ranged the knee.  We felt like we could get the size 12 in.  At this point, we selected the real size 12 components, so it was a size 5, 12 mm.  We  placed that on the tibial tray, reduced the knee.  Nice little pop as it reduced.  We injected the anterior capsule with a combination of Marcaine , Exparel  and saline for postop pain control.  We then went ahead and irrigated thoroughly.  Next, we  repaired the parapatellar arthrotomy with #1 Vicryl suture followed by layered Vicryl subcutaneous closure with 0 and 2-0 Vicryl and then 4-0 running Monocryl for skin.  Steri-Strips were applied followed by a sterile dressing.  The patient tolerated the  surgery well.   PUS D: 04/16/2024 11:58:34 am T: 04/16/2024 2:07:00 pm  JOB: [id number]/ [id number]

## 2024-04-16 NOTE — Care Plan (Signed)
 Ortho [facility] Case Management Note  Patient Details  Name: Paula Cline MRN: [id number] Date of Birth: 19-Apr-1956                  LT TKA on 04/16/24  DCP: Home with friends  DME: RW, Ordered through [facility]  PT: EO 6/2   DME Arranged:  [facility] rolling DME Agency:  [facility]    Additional Comments: Please contact me with any questions of if this plan should need to change.    Kathlene Paradise, Case Manager [facility]  [phone] 04/16/2024, 9:14 AM

## 2024-04-16 NOTE — Interval H&P Note (Signed)
 History and Physical Interval Note:  04/16/2024 9:19 AM  Paula Cline  has presented today for surgery, with the diagnosis of Left knee osteoarthritis.  The various methods of treatment have been discussed with the patient and family. After consideration of risks, benefits and other options for treatment, the patient has consented to  Procedure(s): ARTHROPLASTY, KNEE, TOTAL (Left) as a surgical intervention.  The patient's history has been reviewed, patient examined, no change in status, stable for surgery.  I have reviewed the patient's chart and labs.  Questions were answered to the patient's satisfaction.     Lorriane Rote

## 2024-04-16 NOTE — Evaluation (Signed)
 Physical Therapy Evaluation Patient Details Name: Paula Cline MRN: [id number] DOB: Dec 13, 1955 Today's Date: 04/16/2024  History of Present Illness  Pt s/p L TKR, SVT, thyroid  dz and obesity  Clinical Impression  Pt s/p L TKR and presents with decreased L LE strength/ROM and post op pain limiting functional mobility.  Pt should progress to dc home with assist of friends/family and reports follow up OP PT next week.        If plan is discharge home, recommend the following: A little help with walking and/or transfers;A little help with bathing/dressing/bathroom;Assistance with cooking/housework;Assist for transportation;Help with stairs or ramp for entrance   Can travel by private vehicle        Equipment Recommendations Rolling walker (2 wheels) (has been delivered to room)  Recommendations for Other Services       Functional Status Assessment Patient has had a recent decline in their functional status and demonstrates the ability to make significant improvements in function in a reasonable and predictable amount of time.     Precautions / Restrictions Precautions Precautions: Knee;Fall Required Braces or Orthoses: Knee Immobilizer - Left Knee Immobilizer - Left: Discontinue once straight leg raise with < 10 degree lag Restrictions Weight Bearing Restrictions Per Provider Order: Yes LLE Weight Bearing Per Provider Order: Weight bearing as tolerated      Mobility  Bed Mobility Overal bed mobility: Needs Assistance Bed Mobility: Supine to Sit     Supine to sit: Min assist, HOB elevated     General bed mobility comments: Increased time with assist for L LE    Transfers Overall transfer level: Needs assistance Equipment used: Rolling walker (2 wheels) Transfers: Sit to/from Stand Sit to Stand: Min assist           General transfer comment: cues for LE management and use of UEs to self assist    Ambulation/Gait Ambulation/Gait assistance: Min assist Gait  Distance (Feet): 48 Feet Assistive device: Rolling walker (2 wheels) Gait Pattern/deviations: Step-to pattern, Decreased step length - right, Decreased step length - left, Shuffle Gait velocity: decr     General Gait Details: cues for sequence, posture and position from [employer]            Wheelchair Mobility     Tilt Bed    Modified Rankin (Stroke Patients Only)       Balance Overall balance assessment: Needs assistance Sitting-balance support: No upper extremity supported, Feet supported Sitting balance-Leahy Scale: Good     Standing balance support: No upper extremity supported Standing balance-Leahy Scale: Fair                               Pertinent Vitals/Pain Pain Assessment Pain Assessment: 0-10 Pain Score: 6  Pain Location: L knee Pain Descriptors / Indicators: Aching, Sore Pain Intervention(s): Limited activity within patient's tolerance, Monitored during session, Premedicated before session, Ice applied    Home Living Family/patient expects to be discharged to:: Private residence Living Arrangements: Alone Available Help at Discharge: Family;Friend(s);Available 24 hours/day;Available PRN/intermittently (24/7 initially as needed) Type of Home: House Home Access: Stairs to enter Entrance Stairs-Rails: Right Entrance Stairs-Number of Steps: 3   Home Layout: One level Home Equipment: Cane - single point      Prior Function Prior Level of Function : Independent/Modified Independent                     Extremity/Trunk Assessment  Upper Extremity Assessment Upper Extremity Assessment: Overall WFL for tasks assessed    Lower Extremity Assessment Lower Extremity Assessment: LLE deficits/detail    Cervical / Trunk Assessment Cervical / Trunk Assessment: Normal  Communication   Communication Communication: No apparent difficulties    Cognition Arousal: Alert Behavior During Therapy: WFL for tasks assessed/performed    PT - Cognitive impairments: No apparent impairments                         Following commands: Intact       Cueing Cueing Techniques: Verbal cues     General Comments      Exercises Total Joint Exercises Ankle Circles/Pumps: AROM, Both, 15 reps, Supine   Assessment/Plan    PT Assessment Patient needs continued PT services  PT Problem List Decreased strength;Decreased range of motion;Decreased activity tolerance;Decreased balance;Decreased mobility;Decreased knowledge of use of DME;Pain       PT Treatment Interventions DME instruction;Gait training;Stair training;Functional mobility training;Therapeutic activities;Therapeutic exercise;Balance training;Patient/family education    PT Goals (Current goals can be found in the Care Plan section)  Acute Rehab PT Goals Patient Stated Goal: Regain IND PT Goal Formulation: With patient Time For Goal Achievement: 04/22/24 Potential to Achieve Goals: Good    Frequency 7X/week     Co-evaluation               AM-PAC PT "6 Clicks" Mobility  Outcome Measure Help needed turning from your back to your side while in a flat bed without using bedrails?: A Little Help needed moving from lying on your back to sitting on the side of a flat bed without using bedrails?: A Little Help needed moving to and from a bed to a chair (including a wheelchair)?: A Little Help needed standing up from a chair using your arms (e.g., wheelchair or bedside chair)?: A Little Help needed to walk in hospital room?: A Little Help needed climbing 3-5 steps with a railing? : A Lot 6 Click Score: 17    End of Session Equipment Utilized During Treatment: Gait belt Activity Tolerance: Patient tolerated treatment well Patient left: in chair;with call bell/phone within reach;with chair alarm set;with nursing/sitter in room Nurse Communication: Mobility status PT Visit Diagnosis: Difficulty in walking, not elsewhere classified (R26.2)    Time:  8295-6213 PT Time Calculation (min) (ACUTE ONLY): 28 min   Charges:   PT Evaluation $PT Eval Low Complexity: 1 Low PT Treatments $Gait Training: 8-22 mins PT General Charges $$ ACUTE PT VISIT: 1 Visit         Thedora Finlay PT Acute Rehabilitation Services Pager [phone] Office [phone]   Jedrek Dinovo 04/16/2024, 4:27 PM

## 2024-04-16 NOTE — Brief Op Note (Signed)
 04/16/2024  11:52 AM  PATIENT:  Paula Cline  68 y.o. female  PRE-OPERATIVE DIAGNOSIS:  Left knee osteoarthritis, end stage  POST-OPERATIVE DIAGNOSIS:  Left knee osteoarthritis, end stage  PROCEDURE:  Procedure(s): ARTHROPLASTY, KNEE, TOTAL (Left) DePuy Attune   SURGEON:  Surgeons and Role:    Winston Hawking, MD - Primary  PHYSICIAN ASSISTANT:   ASSISTANTS: Corinthia Dickinson, PA-C   ANESTHESIA:   regional and spinal  EBL:  10 mL   BLOOD ADMINISTERED:none  DRAINS: none   LOCAL MEDICATIONS USED:  MARCAINE      SPECIMEN:  No Specimen  DISPOSITION OF SPECIMEN:  N/A  COUNTS:  YES  TOURNIQUET:  300 mm Hg 80 minutes  DICTATION: .Other Dictation: Dictation Number [id number]  PLAN OF CARE: Admit for overnight observation  PATIENT DISPOSITION:  PACU - hemodynamically stable.   Delay start of Pharmacological VTE agent (>24hrs) due to surgical blood loss or risk of bleeding: no

## 2024-04-17 ENCOUNTER — Other Ambulatory Visit (HOSPITAL_COMMUNITY): Payer: Self-pay

## 2024-04-17 DIAGNOSIS — Z96652 Presence of left artificial knee joint: Secondary | ICD-10-CM | POA: Diagnosis not present

## 2024-04-17 DIAGNOSIS — M1712 Unilateral primary osteoarthritis, left knee: Secondary | ICD-10-CM | POA: Diagnosis not present

## 2024-04-17 MED ORDER — MUPIROCIN 2 % EX OINT
TOPICAL_OINTMENT | Freq: Two times a day (BID) | CUTANEOUS | Status: DC
  Filled 2024-04-17: qty 22

## 2024-04-17 MED ORDER — ONDANSETRON HCL 4 MG PO TABS
4.0000 mg | ORAL_TABLET | Freq: Three times a day (TID) | ORAL | 1 refills | Status: DC | PRN
  Filled 2024-04-17: qty 30, 10d supply, fill #0

## 2024-04-17 MED ORDER — MUPIROCIN 2 % EX OINT
1.0000 | TOPICAL_OINTMENT | Freq: Two times a day (BID) | CUTANEOUS | 0 refills | Status: AC
  Filled 2024-04-17: qty 60, 30d supply, fill #0

## 2024-04-17 MED ORDER — METHOCARBAMOL 500 MG PO TABS
500.0000 mg | ORAL_TABLET | Freq: Three times a day (TID) | ORAL | 1 refills | Status: DC | PRN
Start: 2024-04-17 — End: 2024-07-15

## 2024-04-17 MED ORDER — ASPIRIN 81 MG PO TBEC
81.0000 mg | DELAYED_RELEASE_TABLET | Freq: Two times a day (BID) | ORAL | 0 refills | Status: DC
Start: 2024-04-17 — End: 2024-04-17

## 2024-04-17 MED ORDER — METHOCARBAMOL 500 MG PO TABS
500.0000 mg | ORAL_TABLET | Freq: Three times a day (TID) | ORAL | 1 refills | Status: DC | PRN
  Filled 2024-04-17: qty 60, 20d supply, fill #0

## 2024-04-17 MED ORDER — OXYCODONE-ACETAMINOPHEN 5-325 MG PO TABS
1.0000 | ORAL_TABLET | ORAL | 0 refills | Status: DC | PRN
  Filled 2024-04-17: qty 40, 7d supply, fill #0

## 2024-04-17 MED ORDER — CHLORHEXIDINE GLUCONATE 4 % EX SOLN
1.0000 | CUTANEOUS | 1 refills | Status: DC
  Filled 2024-04-17: qty 946, 42d supply, fill #0

## 2024-04-17 MED ORDER — ASPIRIN 81 MG PO TBEC: 81.0000 mg | DELAYED_RELEASE_TABLET | Freq: Two times a day (BID) | ORAL | 0 refills | Status: AC

## 2024-04-17 NOTE — Care Management Obs Status (Signed)
 MEDICARE OBSERVATION STATUS NOTIFICATION   Patient Details  Name: Paula Cline MRN: [id number] Date of Birth: 02/23/1956   Medicare Observation Status Notification Given:  Yes    Amaryllis Junior, LCSW 04/17/2024, 1:02 PM

## 2024-04-17 NOTE — Progress Notes (Signed)
 Physical Therapy Treatment Patient Details Name: Paula Cline MRN: [id number] DOB: 19-May-1956 Today's Date: 04/17/2024   History of Present Illness Pt s/p L TKR, SVT, thyroid  dz and obesity    PT Comments  Pt continues to progress well with mobility.  Pt up to ambulate increased distance in hall, negotiated stairs and reviewed written HEP.  Pt eager for dc home this date.    If plan is discharge home, recommend the following: A little help with walking and/or transfers;A little help with bathing/dressing/bathroom;Assistance with cooking/housework;Assist for transportation;Help with stairs or ramp for entrance   Can travel by private vehicle        Equipment Recommendations  Rolling walker (2 wheels)    Recommendations for Other Services       Precautions / Restrictions Precautions Precautions: Knee;Fall Required Braces or Orthoses: Knee Immobilizer - Left Knee Immobilizer - Left: Discontinue once straight leg raise with < 10 degree lag Restrictions Weight Bearing Restrictions Per Provider Order: No LLE Weight Bearing Per Provider Order: Weight bearing as tolerated     Mobility  Bed Mobility Overal bed mobility: Needs Assistance Bed Mobility: Supine to Sit     Supine to sit: Supervision     General bed mobility comments: Pt up in chair and requests back to same    Transfers Overall transfer level: Needs assistance Equipment used: Rolling walker (2 wheels) Transfers: Sit to/from Stand Sit to Stand: Supervision           General transfer comment: cues for LE management and use of UEs to self assist    Ambulation/Gait Ambulation/Gait assistance: Contact guard assist, Supervision Gait Distance (Feet): 100 Feet Assistive device: Rolling walker (2 wheels) Gait Pattern/deviations: Step-to pattern, Decreased step length - right, Decreased step length - left, Shuffle Gait velocity: decr     General Gait Details: min cues for sequence, posture and position from  RW   Stairs Stairs: Yes Stairs assistance: Min assist Stair Management: One rail Left, Step to pattern, Forwards, With cane, No rails, Backwards Number of Stairs: 8 General stair comments: 2 steps x 3 and single step twice; two steps with cane and rail; single step twice bkwd for stood into high bed   Wheelchair Mobility     Tilt Bed    Modified Rankin (Stroke Patients Only)       Balance Overall balance assessment: Needs assistance Sitting-balance support: No upper extremity supported, Feet supported Sitting balance-Leahy Scale: Good     Standing balance support: No upper extremity supported Standing balance-Leahy Scale: Fair                              Hotel manager: No apparent difficulties  Cognition Arousal: Alert Behavior During Therapy: WFL for tasks assessed/performed   PT - Cognitive impairments: No apparent impairments                         Following commands: Intact      Cueing Cueing Techniques: Verbal cues  Exercises Total Joint Exercises Ankle Circles/Pumps: AROM, Both, 15 reps, Supine Quad Sets: AROM, Both, 10 reps, Supine Heel Slides: AAROM, Left, 15 reps, Supine Straight Leg Raises: AAROM, Left, 15 reps, Supine Goniometric ROM: AAROM L knee -5 - 45    General Comments        Pertinent Vitals/Pain Pain Assessment Pain Assessment: 0-10 Pain Score: 5  Pain Location: L knee Pain Descriptors / Indicators:  Aching, Sore Pain Intervention(s): Limited activity within patient's tolerance, Monitored during session, Premedicated before session, Ice applied    Home Living                          Prior Function            PT Goals (current goals can now be found in the care plan section) Acute Rehab PT Goals Patient Stated Goal: Regain IND PT Goal Formulation: With patient Time For Goal Achievement: 04/22/24 Potential to Achieve Goals: Good Progress towards PT goals:  Progressing toward goals    Frequency    7X/week      PT Plan      Co-evaluation              AM-PAC PT "6 Clicks" Mobility   Outcome Measure  Help needed turning from your back to your side while in a flat bed without using bedrails?: A Little Help needed moving from lying on your back to sitting on the side of a flat bed without using bedrails?: A Little Help needed moving to and from a bed to a chair (including a wheelchair)?: A Little Help needed standing up from a chair using your arms (e.g., wheelchair or bedside chair)?: A Little Help needed to walk in hospital room?: A Little Help needed climbing 3-5 steps with a railing? : A Little 6 Click Score: 18    End of Session Equipment Utilized During Treatment: Gait belt Activity Tolerance: Patient tolerated treatment well Patient left: in chair;with call bell/phone within reach;with chair alarm set;with nursing/sitter in room Nurse Communication: Mobility status PT Visit Diagnosis: Difficulty in walking, not elsewhere classified (R26.2)     Time: 9562-1308 PT Time Calculation (min) (ACUTE ONLY): 34 min  Charges:    $Gait Training: 8-22 mins $Therapeutic Exercise: 8-22 mins $Therapeutic Activity: 8-22 mins PT General Charges $$ ACUTE PT VISIT: 1 Visit                     Thedora Finlay PT Acute Rehabilitation Services Pager [phone] Office [phone]    Cordaro Mukai 04/17/2024, 1:31 PM

## 2024-04-17 NOTE — Progress Notes (Signed)
    Subjective: 1 Day Post-Op Procedure(s) (LRB): ARTHROPLASTY, KNEE, TOTAL (Left) Patient reports pain as moderate.   Denies CP or SOB.  Voiding without difficulty. Positive flatus. Objective: Vital signs in last 24 hours: Temp:  [96.6 F (35.9 C)-98.2 F (36.8 C)] 98.2 F (36.8 C) (05/31 0531) Pulse Rate:  [65-89] 73 (05/31 0531) Resp:  [10-21] 17 (05/31 0531) BP: (114-156)/(59-85) 128/66 (05/31 0531) SpO2:  [95 %-100 %] 95 % (05/31 0531)  Intake/Output from previous day: 05/30 0701 - 05/31 0700 In: 2270 [P.O.:120; I.V.:1500; IV Piggyback:650] Out: 450 [Urine:400; Blood:50] Intake/Output this shift: No intake/output data recorded.  Labs: No results for input(s): "HGB" in the last 72 hours. No results for input(s): "WBC", "RBC", "HCT", "PLT" in the last 72 hours. No results for input(s): "NA", "K", "CL", "CO2", "BUN", "CREATININE", "GLUCOSE", "CALCIUM " in the last 72 hours. No results for input(s): "LABPT", "INR" in the last 72 hours.  Physical Exam: Neurologically intact Neurovascular intact Sensation intact distally Intact pulses distally Dorsiflexion/Plantar flexion intact Incision: dressing C/D/I No cellulitis present Compartment soft Body mass index is 40.81 kg/m.   Assessment/Plan: 1 Day Post-Op Procedure(s) (LRB): ARTHROPLASTY, KNEE, TOTAL (Left) -Plan is to discharge to home today  -Patient instructed about at home post-operative medications -Patient instructed to continue incentive spirometry  -Patient will follow up with Dr. Brunilda Capra in the office for first post-op visit -All questions welcomed and answered  [place] Due for Dr. Mort Ards [facility] [phone] 04/17/2024, 8:25 AM

## 2024-04-17 NOTE — Progress Notes (Signed)
 Physical Therapy Treatment Patient Details Name: Paula Cline MRN: [id number] DOB: [birth year] Today's Date: 04/17/2024   History of Present Illness Pt s/p L TKR, SVT, thyroid  dz and obesity    PT Comments  Pt motivated and progressing well with mobility.  Pt performed HEP and up to ambulate increased distance in hall.  Will follow up with stair training.  Pt hopeful for dc home this date.    If plan is discharge home, recommend the following: A little help with walking and/or transfers;A little help with bathing/dressing/bathroom;Assistance with cooking/housework;Assist for transportation;Help with stairs or ramp for entrance   Can travel by private vehicle        Equipment Recommendations  Rolling walker (2 wheels)    Recommendations for Other Services       Precautions / Restrictions Precautions Precautions: Knee;Fall Required Braces or Orthoses: Knee Immobilizer - Left Knee Immobilizer - Left: Discontinue once straight leg raise with < 10 degree lag (Pt performed IND SLR this am) Restrictions Weight Bearing Restrictions Per Provider Order: No LLE Weight Bearing Per Provider Order: Weight bearing as tolerated     Mobility  Bed Mobility Overal bed mobility: Needs Assistance Bed Mobility: Supine to Sit     Supine to sit: Supervision     General bed mobility comments: Increased time; pt self assisting L LE with gait belt    Transfers Overall transfer level: Needs assistance Equipment used: Rolling walker (2 wheels) Transfers: Sit to/from Stand Sit to Stand: Contact guard assist           General transfer comment: cues for LE management and use of UEs to self assist    Ambulation/Gait Ambulation/Gait assistance: Min assist, Contact guard assist Gait Distance (Feet): 75 Feet Assistive device: Rolling walker (2 wheels) Gait Pattern/deviations: Step-to pattern, Decreased step length - right, Decreased step length - left, Shuffle Gait velocity: decr      General Gait Details: cues for sequence, posture and position from [employer]             Wheelchair Mobility     Tilt Bed    Modified Rankin (Stroke Patients Only)       Balance Overall balance assessment: Needs assistance Sitting-balance support: No upper extremity supported, Feet supported Sitting balance-Leahy Scale: Good     Standing balance support: No upper extremity supported Standing balance-Leahy Scale: Fair                              Hotel manager: No apparent difficulties  Cognition Arousal: Alert Behavior During Therapy: WFL for tasks assessed/performed   PT - Cognitive impairments: No apparent impairments                         Following commands: Intact      Cueing Cueing Techniques: Verbal cues  Exercises Total Joint Exercises Ankle Circles/Pumps: AROM, Both, 15 reps, Supine Quad Sets: AROM, Both, 10 reps, Supine Heel Slides: AAROM, Left, 15 reps, Supine Straight Leg Raises: AAROM, Left, 15 reps, Supine Goniometric ROM: AAROM L knee -5 - 45    General Comments        Pertinent Vitals/Pain Pain Assessment Pain Assessment: 0-10 Pain Score: 5  Pain Location: L knee Pain Descriptors / Indicators: Aching, Sore Pain Intervention(s): Limited activity within patient's tolerance, Monitored during session, Premedicated before session, Ice applied    Home Living  Prior Function            PT Goals (current goals can now be found in the care plan section) Acute Rehab PT Goals Patient Stated Goal: Regain IND PT Goal Formulation: With patient Time For Goal Achievement: 04/22/24 Potential to Achieve Goals: Good Progress towards PT goals: Progressing toward goals    Frequency    7X/week      PT Plan      Co-evaluation              AM-PAC PT "6 Clicks" Mobility   Outcome Measure  Help needed turning from your back to your side  while in a flat bed without using bedrails?: A Little Help needed moving from lying on your back to sitting on the side of a flat bed without using bedrails?: A Little Help needed moving to and from a bed to a chair (including a wheelchair)?: A Little Help needed standing up from a chair using your arms (e.g., wheelchair or bedside chair)?: A Little Help needed to walk in hospital room?: A Little Help needed climbing 3-5 steps with a railing? : A Lot 6 Click Score: 17    End of Session Equipment Utilized During Treatment: Gait belt Activity Tolerance: Patient tolerated treatment well Patient left: in chair;with call bell/phone within reach;with chair alarm set;with nursing/sitter in room Nurse Communication: Mobility status PT Visit Diagnosis: Difficulty in walking, not elsewhere classified (R26.2)     Time: 1610-9604 PT Time Calculation (min) (ACUTE ONLY): 35 min  Charges:    $Gait Training: 8-22 mins $Therapeutic Exercise: 8-22 mins PT General Charges $$ ACUTE PT VISIT: 1 Visit                     Thedora Finlay PT Acute Rehabilitation Services Pager [phone] Office [phone]    Coraima Tibbs 04/17/2024, 1:23 PM

## 2024-04-17 NOTE — Progress Notes (Signed)
 Discharge medications delivered to patient at bedside [facility]

## 2024-04-19 ENCOUNTER — Encounter (HOSPITAL_COMMUNITY): Payer: Self-pay | Admitting: Orthopedic Surgery

## 2024-04-19 ENCOUNTER — Other Ambulatory Visit (HOSPITAL_COMMUNITY): Payer: Self-pay

## 2024-04-19 DIAGNOSIS — M25562 Pain in left knee: Secondary | ICD-10-CM | POA: Diagnosis not present

## 2024-04-19 DIAGNOSIS — M25662 Stiffness of left knee, not elsewhere classified: Secondary | ICD-10-CM | POA: Diagnosis not present

## 2024-04-20 NOTE — Discharge Summary (Signed)
 In most cases prophylactic antibiotics for Dental procdeures after total joint surgery are not necessary.  Exceptions are as follows:  1. History of prior total joint infection  2. Severely immunocompromised (Organ Transplant, cancer chemotherapy, Rheumatoid biologic meds such as Humera)  3. Poorly controlled diabetes (A1C &gt; 8.0, blood glucose over 200)  If you have one of these conditions, contact your surgeon for an antibiotic prescription, prior to your dental procedure. Orthopedic Discharge Summary        Physician Discharge Summary  Patient ID: Paula Cline MRN: [id number] DOB/AGE: Feb 28, 1956 68 y.o.  Admit date: 04/16/2024 Discharge date: 04/17/24  Procedures:  Procedure(s) (LRB): ARTHROPLASTY, KNEE, TOTAL (Left)  Attending Physician:  Dr. Marionette Sick  Admission Diagnoses:   left knee end stage osteoarthritis  Discharge Diagnoses:  left knee end stage osteoarthritis   Past Medical History:  Diagnosis Date   Allergy    RHINITIS   Anxiety    mild   Arthritis    Diverticulitis    Former smoker    Hypercholesteremia    Hypertension    Obesity    SVT (supraventricular tachycardia) (HCC) 02/2020   SVT (supraventricular tachycardia) (HCC) 10/27/2019   Thyroid  disease     PCP: Watson Hacking, MD   Discharged Condition: good  Hospital Course:  Patient underwent the above stated procedure on 04/16/2024. Patient tolerated the procedure well and brought to the recovery room in good condition and subsequently to the floor. Patient had an uncomplicated hospital course and was stable for discharge.   Disposition: Discharge disposition: 01-Home or Self Care      with follow up in 2 weeks    Follow-up Information     Winston Hawking, MD. Go on 04/28/2024.   Specialty: Orthopedic Surgery Why: You are scheduled for first postop appt on Wednesday June 11 at 10:45am. Contact information: [address] [place]  [address] [phone]         Winston Hawking, MD. Call in 2 week(s).   Specialty: Orthopedic Surgery Why: please call for appt in two weeks in the office Contact information: [address] [place] [address] [phone]                 Dental Antibiotics:  In most cases prophylactic antibiotics for Dental procdeures after total joint surgery are not necessary.  Exceptions are as follows:  1. History of prior total joint infection  2. Severely immunocompromised (Organ Transplant, cancer chemotherapy, Rheumatoid biologic meds such as Humera)  3. Poorly controlled diabetes (A1C &gt; 8.0, blood glucose over 200)  If you have one of these conditions, contact your surgeon for an antibiotic prescription, prior to your dental procedure.  Discharge Instructions     Diet - low sodium heart healthy   Complete by: As directed    Increase activity slowly   Complete by: As directed        Allergies as of 04/17/2024       Reactions   Codeine Itching   Hydrocodone Itching   Phentermine     Couldn't sleep        Medication List     STOP taking these medications    phentermine  15 MG capsule       TAKE these medications    acetaminophen  500 MG tablet Commonly known as: TYLENOL  Take 1,000 mg by mouth every 6 (six) hours as needed for moderate pain (pain score 4-6).   ALPRAZolam  0.25 MG tablet Commonly known as: XANAX   TAKE 1 TABLET BY MOUTH 2 TIMES A DAY AS NEEDED FOR ANXIETY   aspirin  EC 81 MG tablet Take 1 tablet (81 mg total) by mouth in the morning and at bedtime. Start on POD #2   atorvastatin  20 MG tablet Commonly known as: LIPITOR Take 1 tablet (20 mg total) by mouth daily.   b complex vitamins capsule Take 1 capsule by mouth daily.   Betasept  Surgical Scrub 4 % external liquid Generic drug: chlorhexidine  Apply 15 mLs (1 Application total) topically as directed for 30 doses. Use as directed daily for 5 days every other week  for 6 weeks.   calcium  elemental as carbonate 400 MG chewable tablet Commonly known as: BARIATRIC TUMS ULTRA Chew 1,000-2,000 mg by mouth daily as needed for heartburn.   CALCIUM +D3 PO Take 1 tablet by mouth daily.   cholecalciferol  25 MCG (1000 UNIT) tablet Commonly known as: VITAMIN D3 Take 1,000 Units by mouth daily.   diclofenac  sodium 1 % Gel Commonly known as: VOLTAREN  Apply 1 application  topically 2 (two) times daily as needed (knee pain).   Eszopiclone  3 MG Tabs TAKE 1 TABLET BY MOUTH IMMEDIATELY AT BEDTIME AS NEEDED   fluticasone  50 MCG/ACT nasal spray Commonly known as: FLONASE  Place 1 spray into both nostrils daily as needed for allergies or rhinitis.   hydrocortisone  cream 1 % Apply 1 Application topically daily as needed for itching.   levothyroxine  112 MCG tablet Commonly known as: SYNTHROID  Take 1 tablet (112 mcg total) by mouth daily.   lisinopril -hydrochlorothiazide  20-12.5 MG tablet Commonly known as: ZESTORETIC  Take 1 tablet by mouth daily.   loratadine  10 MG tablet Commonly known as: CLARITIN  Take 10 mg by mouth as needed for allergies (seasonally).   LUBRICATING EYE DROPS OP Place 1 drop into both eyes daily.   Magnesium  400 MG Caps Take 400 mg by mouth daily.   methocarbamol  500 MG tablet Commonly known as: ROBAXIN  Take 1 tablet (500 mg total) by mouth every 8 (eight) hours as needed for muscle spasms.   mupirocin  ointment 2 % Commonly known as: BACTROBAN  Place 1 Application into the nose 2 (two) times daily for 60 doses. Use as directed 2 times daily for 5 days every other week for 6 weeks.   ondansetron  4 MG tablet Commonly known as: Zofran  Take 1 tablet (4 mg total) by mouth every 8 (eight) hours as needed for nausea, vomiting or refractory nausea / vomiting.   Osteo Bi-Flex One Per Day Tabs Take 1 tablet by mouth daily.   oxyCODONE -acetaminophen  5-325 MG tablet Commonly known as: Percocet Take 1 tablet by mouth every 4 (four)  hours as needed for severe pain (pain score 7-10).   Potassium Gluconate 550 (90 K) MG Tabs Take 1 tablet by mouth daily.   THERAWORX RELIEF EX Apply 1 Application topically daily as needed (cramps).   valACYclovir  1000 MG tablet Commonly known as: VALTREX  TAKE 2 TABLETS BY MOUTH TWICE A DAY FOR 1 DAY, THEN AS DIRECTED BY YOUR DOCTOR   Womens 50+ Multi Vitamin/Min Tabs Take 1 tablet by mouth daily.          Signed: Corinthia Dickinson 04/20/2024, 7:37 AM  [facility] is now [facility] [address], [place], [place] [address] Phone: [phone] Facebook  Instagram  [employer]

## 2024-04-21 DIAGNOSIS — M25562 Pain in left knee: Secondary | ICD-10-CM | POA: Diagnosis not present

## 2024-04-21 DIAGNOSIS — M25662 Stiffness of left knee, not elsewhere classified: Secondary | ICD-10-CM | POA: Diagnosis not present

## 2024-04-23 DIAGNOSIS — M25662 Stiffness of left knee, not elsewhere classified: Secondary | ICD-10-CM | POA: Diagnosis not present

## 2024-04-23 DIAGNOSIS — M25562 Pain in left knee: Secondary | ICD-10-CM | POA: Diagnosis not present

## 2024-04-26 DIAGNOSIS — M25662 Stiffness of left knee, not elsewhere classified: Secondary | ICD-10-CM | POA: Diagnosis not present

## 2024-04-26 DIAGNOSIS — M25562 Pain in left knee: Secondary | ICD-10-CM | POA: Diagnosis not present

## 2024-04-28 DIAGNOSIS — Z471 Aftercare following joint replacement surgery: Secondary | ICD-10-CM | POA: Diagnosis not present

## 2024-04-28 DIAGNOSIS — Z96652 Presence of left artificial knee joint: Secondary | ICD-10-CM | POA: Diagnosis not present

## 2024-04-30 DIAGNOSIS — M25662 Stiffness of left knee, not elsewhere classified: Secondary | ICD-10-CM | POA: Diagnosis not present

## 2024-04-30 DIAGNOSIS — M25562 Pain in left knee: Secondary | ICD-10-CM | POA: Diagnosis not present

## 2024-05-03 DIAGNOSIS — M25562 Pain in left knee: Secondary | ICD-10-CM | POA: Diagnosis not present

## 2024-05-03 DIAGNOSIS — M25662 Stiffness of left knee, not elsewhere classified: Secondary | ICD-10-CM | POA: Diagnosis not present

## 2024-05-06 DIAGNOSIS — M25662 Stiffness of left knee, not elsewhere classified: Secondary | ICD-10-CM | POA: Diagnosis not present

## 2024-05-06 DIAGNOSIS — M25562 Pain in left knee: Secondary | ICD-10-CM | POA: Diagnosis not present

## 2024-05-11 DIAGNOSIS — M25562 Pain in left knee: Secondary | ICD-10-CM | POA: Diagnosis not present

## 2024-05-11 DIAGNOSIS — M25662 Stiffness of left knee, not elsewhere classified: Secondary | ICD-10-CM | POA: Diagnosis not present

## 2024-05-13 DIAGNOSIS — Z1231 Encounter for screening mammogram for malignant neoplasm of breast: Secondary | ICD-10-CM | POA: Diagnosis not present

## 2024-05-13 LAB — HM MAMMOGRAPHY

## 2024-05-14 ENCOUNTER — Encounter: Payer: Self-pay | Admitting: Family Medicine

## 2024-05-14 DIAGNOSIS — M25662 Stiffness of left knee, not elsewhere classified: Secondary | ICD-10-CM | POA: Diagnosis not present

## 2024-05-14 DIAGNOSIS — M25562 Pain in left knee: Secondary | ICD-10-CM | POA: Diagnosis not present

## 2024-05-17 DIAGNOSIS — M25662 Stiffness of left knee, not elsewhere classified: Secondary | ICD-10-CM | POA: Diagnosis not present

## 2024-05-17 DIAGNOSIS — M25562 Pain in left knee: Secondary | ICD-10-CM | POA: Diagnosis not present

## 2024-05-19 DIAGNOSIS — M25562 Pain in left knee: Secondary | ICD-10-CM | POA: Diagnosis not present

## 2024-05-19 DIAGNOSIS — M25662 Stiffness of left knee, not elsewhere classified: Secondary | ICD-10-CM | POA: Diagnosis not present

## 2024-05-24 DIAGNOSIS — M25662 Stiffness of left knee, not elsewhere classified: Secondary | ICD-10-CM | POA: Diagnosis not present

## 2024-05-24 DIAGNOSIS — M25562 Pain in left knee: Secondary | ICD-10-CM | POA: Diagnosis not present

## 2024-06-08 DIAGNOSIS — D1801 Hemangioma of skin and subcutaneous tissue: Secondary | ICD-10-CM | POA: Diagnosis not present

## 2024-06-08 DIAGNOSIS — L821 Other seborrheic keratosis: Secondary | ICD-10-CM | POA: Diagnosis not present

## 2024-06-08 DIAGNOSIS — D229 Melanocytic nevi, unspecified: Secondary | ICD-10-CM | POA: Diagnosis not present

## 2024-06-08 DIAGNOSIS — L578 Other skin changes due to chronic exposure to nonionizing radiation: Secondary | ICD-10-CM | POA: Diagnosis not present

## 2024-06-08 DIAGNOSIS — D485 Neoplasm of uncertain behavior of skin: Secondary | ICD-10-CM | POA: Diagnosis not present

## 2024-06-08 DIAGNOSIS — L814 Other melanin hyperpigmentation: Secondary | ICD-10-CM | POA: Diagnosis not present

## 2024-06-14 ENCOUNTER — Ambulatory Visit: Payer: Self-pay | Admitting: *Deleted

## 2024-06-14 NOTE — Telephone Encounter (Signed)
 Patient scheduled with Dr Joyce, pt preference 06/22/24

## 2024-06-14 NOTE — Telephone Encounter (Signed)
 FYI Only or Action Required?: Action required by provider: request for appointment.  Patient was last seen in primary care on 03/17/2024 by Joyce Norleen BROCKS, MD.  Called Nurse Triage reporting Depression.  Symptoms began several weeks ago.  Interventions attempted: Nothing.  Symptoms are: gradually worsening.  Triage Disposition: See Physician Within 24 Hours  Patient/caregiver understands and will follow disposition?: No open appointment- needs to be scheduled with provider- sent request for review  Reason for Disposition  [1] Depression AND [2] getting worse (e.g., sleeping poorly, less able to do activities of daily living)  Answer Assessment - Initial Assessment Questions 1. CONCERN: What happened that made you call today?     Patient states she has had changes- weight gain, slipping in emotions, not excited about things she should be 2. DEPRESSION SYMPTOM SCREENING: How are you feeling overall? (e.g., decreased energy, increased sleeping or difficulty sleeping, difficulty concentrating, feelings of sadness, guilt, hopelessness, or worthlessness)     More of attitude problem- not happy 3. RISK OF HARM - SUICIDAL IDEATION:  Do you ever have thoughts of hurting or killing yourself?  (e.g., yes, no, no but preoccupation with thoughts about death)     no 4. RISK OF HARM - HOMICIDAL IDEATION:  Do you ever have thoughts of hurting or killing someone else?  (e.g., yes, no, no but preoccupation with thoughts about death)     no 5. FUNCTIONAL IMPAIRMENT: How have things been going for you overall? Have you had more difficulty than usual doing your normal daily activities?  (e.g., better, same, worse; self-care, school, work, interactions)     Lack luster- on repeat 6. SUPPORT: Who is with you now? Who do you live with? Do you have family or friends who you can talk to?      Friends in place- does not share negative attitude 7. THERAPIST: Do you have a counselor or therapist?  If Yes, ask: What is their name?     no  Protocols used: Depression-A-AH    Copied from CRM [id number]. Topic: Clinical - Red Word Triage >> Jun 14, 2024  8:40 AM Willma SAUNDERS wrote: [facility] that prompted transfer to Nurse Triage: Patient would like to be seen to discuss her mental health and depression. Possibly discuss medication.

## 2024-06-17 DIAGNOSIS — M1711 Unilateral primary osteoarthritis, right knee: Secondary | ICD-10-CM | POA: Diagnosis not present

## 2024-06-22 ENCOUNTER — Ambulatory Visit: Admitting: Family Medicine

## 2024-06-24 ENCOUNTER — Other Ambulatory Visit: Payer: Self-pay | Admitting: Family Medicine

## 2024-06-24 DIAGNOSIS — M1711 Unilateral primary osteoarthritis, right knee: Secondary | ICD-10-CM | POA: Diagnosis not present

## 2024-06-24 DIAGNOSIS — I700 Atherosclerosis of aorta: Secondary | ICD-10-CM

## 2024-06-24 DIAGNOSIS — E785 Hyperlipidemia, unspecified: Secondary | ICD-10-CM

## 2024-06-24 DIAGNOSIS — I10 Essential (primary) hypertension: Secondary | ICD-10-CM

## 2024-06-30 DIAGNOSIS — M1711 Unilateral primary osteoarthritis, right knee: Secondary | ICD-10-CM | POA: Diagnosis not present

## 2024-07-11 ENCOUNTER — Other Ambulatory Visit: Payer: Self-pay | Admitting: Family Medicine

## 2024-07-11 DIAGNOSIS — F419 Anxiety disorder, unspecified: Secondary | ICD-10-CM

## 2024-07-12 NOTE — Telephone Encounter (Signed)
 Are these okay to refill?

## 2024-07-15 ENCOUNTER — Ambulatory Visit (INDEPENDENT_AMBULATORY_CARE_PROVIDER_SITE_OTHER): Payer: PPO | Admitting: Family Medicine

## 2024-07-15 ENCOUNTER — Encounter: Payer: Self-pay | Admitting: Family Medicine

## 2024-07-15 VITALS — BP 122/78 | HR 88 | Ht 62.0 in | Wt 224.2 lb

## 2024-07-15 DIAGNOSIS — I700 Atherosclerosis of aorta: Secondary | ICD-10-CM | POA: Diagnosis not present

## 2024-07-15 DIAGNOSIS — Z96652 Presence of left artificial knee joint: Secondary | ICD-10-CM | POA: Diagnosis not present

## 2024-07-15 DIAGNOSIS — J309 Allergic rhinitis, unspecified: Secondary | ICD-10-CM | POA: Diagnosis not present

## 2024-07-15 DIAGNOSIS — Z1321 Encounter for screening for nutritional disorder: Secondary | ICD-10-CM | POA: Diagnosis not present

## 2024-07-15 DIAGNOSIS — I10 Essential (primary) hypertension: Secondary | ICD-10-CM

## 2024-07-15 DIAGNOSIS — M1990 Unspecified osteoarthritis, unspecified site: Secondary | ICD-10-CM

## 2024-07-15 DIAGNOSIS — D126 Benign neoplasm of colon, unspecified: Secondary | ICD-10-CM | POA: Diagnosis not present

## 2024-07-15 DIAGNOSIS — M8580 Other specified disorders of bone density and structure, unspecified site: Secondary | ICD-10-CM | POA: Diagnosis not present

## 2024-07-15 DIAGNOSIS — E785 Hyperlipidemia, unspecified: Secondary | ICD-10-CM

## 2024-07-15 DIAGNOSIS — F341 Dysthymic disorder: Secondary | ICD-10-CM | POA: Insufficient documentation

## 2024-07-15 DIAGNOSIS — Z0000 Encounter for general adult medical examination without abnormal findings: Secondary | ICD-10-CM | POA: Diagnosis not present

## 2024-07-15 DIAGNOSIS — F419 Anxiety disorder, unspecified: Secondary | ICD-10-CM

## 2024-07-15 DIAGNOSIS — E038 Other specified hypothyroidism: Secondary | ICD-10-CM | POA: Diagnosis not present

## 2024-07-15 MED ORDER — LISINOPRIL-HYDROCHLOROTHIAZIDE 20-12.5 MG PO TABS: 1.0000 | ORAL_TABLET | Freq: Every day | ORAL | 0 refills | Status: DC

## 2024-07-15 MED ORDER — LEVOTHYROXINE SODIUM 112 MCG PO TABS: 112.0000 ug | ORAL_TABLET | Freq: Every day | ORAL | 3 refills | Status: AC

## 2024-07-15 MED ORDER — ATORVASTATIN CALCIUM 20 MG PO TABS: 20.0000 mg | ORAL_TABLET | Freq: Every day | ORAL | 0 refills | Status: DC

## 2024-07-15 MED ORDER — VENLAFAXINE HCL ER 37.5 MG PO CP24: 37.5000 mg | ORAL_CAPSULE | Freq: Every day | ORAL | 1 refills | Status: DC

## 2024-07-15 NOTE — Progress Notes (Signed)
 Complete physical exam  Patient: Paula Cline   DOB: [birth year]   69 y.o. Female  MRN: [id number]  Subjective:    Chief Complaint  Patient presents with   Annual Exam    Fasting    Paula Cline is a 68 y.o. female who presents today for a complete physical exam.  She reports consuming a general diet. Gym/ health club routine includes light weights. She generally feels well. She reports sleeping fairly well.  She does complain of decreased energy as well as lack of interest in doing anything.  She does stay engaged but does not more out of habit than some desire.  She has had difficulty in the past with anxiety and apparently responded well to Effexor .  She continues on her thyroid  medication.  She is also taking atorvastatin  and having no difficulty with that.  Continues on lisinopril /HCTZ.  She does have underlying allergies and is using an OTC Claritin .  She is scheduled for follow-up colonoscopy for adenomatous polyp.  She does have a history of osteopenia and is due for follow-up DEXA scan.  Replacement and is happy with the results.  She is up-to-date on her shots as well as health maintenance and has had a mammogram.  Most recent fall risk assessment:    03/06/2024    8:41 AM  Fall Risk   Falls in the past year? 0  Number falls in past yr: 0  Injury with Fall? 0  Risk for fall due to : Medication side effect  Follow up Falls prevention discussed;Falls evaluation completed     Most recent depression screenings:    03/09/2024   10:12 AM 06/26/2023    8:16 AM  PHQ 2/9 Scores  PHQ - 2 Score 0 0  PHQ- 9 Score 1     Optho- [facility] 10-24 Last Visit Dentist- [facility] Colonoscopy- 02-02-24 Mammogram-05-13-24 [facility] Dr. Kay     Immunization History  Administered Date(s) Administered   DTaP 10/25/1996, 07/24/2004   Hepatitis A 07/24/2004, 10/03/2008   INFLUENZA, HIGH DOSE SEASONAL PF 08/08/2023   Influenza Split 07/31/2011, 10/05/2012, 08/02/2015    Influenza Whole 09/22/2007, 08/19/2008, 10/10/2009   Influenza, Quadrivalent, Recombinant, Inj, Pf 09/01/2018, 08/03/2019   Influenza,inj,Quad PF,6+ Mos 08/13/2013, 10/31/2014, 08/15/2017, 09/01/2020   Influenza-Unspecified 09/06/2016, 08/15/2017, 09/05/2019, 08/08/2021, 08/12/2022   Moderna Covid-19 Vaccine Bivalent Booster 53yrs & up 08/21/2021   PFIZER Comirnaty(Gray Top)Covid-19 Tri-Sucrose Vaccine 02/21/2021   PFIZER(Purple Top)SARS-COV-2 Vaccination 02/02/2020, 02/23/2020, 09/01/2020   PNEUMOCOCCAL CONJUGATE-20 06/24/2022   PPD Test 12/18/1997   Pfizer(Comirnaty)Fall Seasonal Vaccine 12 years and older 08/18/2022, 02/11/2023, 08/08/2023   Respiratory Syncytial Virus Vaccine,Recomb Aduvanted(Arexvy) 07/11/2023   Tdap 11/08/2013, 07/04/2023   Zoster Recombinant(Shingrix ) 02/09/2018, 04/16/2018   Zoster, Live 08/02/2015    Health Maintenance  Topic Date Due   COVID-19 Vaccine (9 - Pfizer risk 2024-25 season) 07/31/2024 (Originally 02/05/2024)   INFLUENZA VACCINE  02/15/2025 (Originally 06/18/2024)   Medicare Annual Wellness (AWV)  03/09/2025   MAMMOGRAM  05/13/2026   Colonoscopy  02/02/2031   DTaP/Tdap/Td (5 - Td or Tdap) 07/03/2033   Pneumococcal Vaccine: 50+ Years  Completed   DEXA SCAN  Completed   Hepatitis C Screening  Completed   Zoster Vaccines- Shingrix   Completed   HPV VACCINES  Aged Out   Meningococcal B Vaccine  Aged Out    Patient Care Team: Joyce Norleen BROCKS, MD as PCP - General Raford Riggs, MD as PCP - Cardiology (Cardiology)   Outpatient Medications Prior  to Visit  Medication Sig   acetaminophen  (TYLENOL ) 500 MG tablet Take 1,000 mg by mouth every 6 (six) hours as needed for moderate pain (pain score 4-6).   ALPRAZolam  (XANAX ) 0.25 MG tablet TAKE 1 TABLET BY MOUTH 2 TIMES A DAY AS NEEDED FOR ANXIETY   b complex vitamins capsule Take 1 capsule by mouth daily.   Boswellia-Glucosamine-Vit D (OSTEO BI-FLEX ONE PER DAY) TABS Take 1 tablet by mouth daily.    Calcium  Carb-Cholecalciferol  (CALCIUM +D3 PO) Take 1 tablet by mouth daily.   calcium  elemental as carbonate (BARIATRIC TUMS ULTRA) 400 MG chewable tablet Chew 1,000-2,000 mg by mouth daily as needed for heartburn.   Carboxymethylcellul-Glycerin (LUBRICATING EYE DROPS OP) Place 1 drop into both eyes daily.   cholecalciferol  (VITAMIN D3) 25 MCG (1000 UNIT) tablet Take 1,000 Units by mouth daily.   diclofenac  sodium (VOLTAREN ) 1 % GEL Apply 1 application  topically 2 (two) times daily as needed (knee pain).   Eszopiclone  3 MG TABS TAKE ONE TABLET BY MOUTH IMMEDIATELY AT BEDTIME AS NEEDED   fluticasone  (FLONASE ) 50 MCG/ACT nasal spray Place 1 spray into both nostrils daily as needed for allergies or rhinitis.   Homeopathic Products (THERAWORX RELIEF EX) Apply 1 Application topically daily as needed (cramps).   hydrocortisone  cream 1 % Apply 1 Application topically daily as needed for itching.   loratadine  (CLARITIN ) 10 MG tablet Take 10 mg by mouth as needed for allergies (seasonally).   Magnesium  400 MG CAPS Take 400 mg by mouth daily.   Multiple Vitamins-Minerals (WOMENS 50+ MULTI VITAMIN/MIN) TABS Take 1 tablet by mouth daily.    Potassium Gluconate 550 (90 K) MG TABS Take 1 tablet by mouth daily.   valACYclovir  (VALTREX ) 1000 MG tablet TAKE 2 TABLETS BY MOUTH TWICE A DAY FOR 1 DAY, THEN AS DIRECTED BY YOUR DOCTOR   [DISCONTINUED] atorvastatin  (LIPITOR) 20 MG tablet TAKE 1 TABLET BY MOUTH DAILY   [DISCONTINUED] levothyroxine  (SYNTHROID ) 112 MCG tablet Take 1 tablet (112 mcg total) by mouth daily.   [DISCONTINUED] lisinopril -hydrochlorothiazide  (ZESTORETIC ) 20-12.5 MG tablet TAKE 1 TABLET BY MOUTH DAILY   [DISCONTINUED] chlorhexidine  (HIBICLENS ) 4 % external liquid Apply 15 mLs (1 Application total) topically as directed for 30 doses. Use as directed daily for 5 days every other week for 6 weeks.   [DISCONTINUED] methocarbamol  (ROBAXIN ) 500 MG tablet Take 1 tablet (500 mg total) by mouth every 8  (eight) hours as needed for muscle spasms.   [DISCONTINUED] ondansetron  (ZOFRAN ) 4 MG tablet Take 1 tablet (4 mg total) by mouth every 8 (eight) hours as needed for nausea, vomiting or refractory nausea / vomiting.   [DISCONTINUED] oxyCODONE -acetaminophen  (PERCOCET) 5-325 MG tablet Take 1 tablet by mouth every 4 (four) hours as needed for severe pain (pain score 7-10).   No facility-administered medications prior to visit.    Review of Systems  All other systems reviewed and are negative.   Family and social history as well as health maintenance and immunizations was reviewed.     Objective:    BP 122/78   Pulse 88   Ht 5' 2 (1.575 m)   Wt 224 lb 3.2 oz (101.7 kg)   LMP 11/18/2010   SpO2 97%   BMI 41.01 kg/m    Physical Exam   Alert and in no distress. Tympanic membranes and canals are normal. Pharyngeal area is normal. Neck is supple without adenopathy or thyromegaly. Cardiac exam shows a regular sinus rhythm without murmurs or gallops. Lungs are clear to auscultation.  Assessment & Plan:    Routine general medical examination at a health care facility  Allergic rhinitis, mild continue on present medication regimen.  Arthritis discussed physical therapy with her for her knee in terms of flexion and extension and working on terminal extension exercises.  Atherosclerosis of aorta (HCC)  Colonic adenomatous polyposis   she does have a follow-up colonoscopy scheduled.  Essential hypertension.  Continue on present medication regimen.  H/O total knee replacement, left  Hyperlipidemia with target LDL less than 100 - Plan: Lipid panel  Other specified hypothyroidism - Plan: TSH continue Synthroid   Osteopenia, unspecified location - Plan: DG Bone Density  Dysthymia - Plan: venlafaxine  XR (EFFEXOR  XR) 37.5 MG 24 hr capsule.  Also recommend counseling and gave her [facility] or [facility].  Encounter for vitamin deficiency  screening - Plan: VITAMIN D  25 Hydroxy (Vit-D Deficiency, Fractures)   Return in about 1 month (around 08/15/2024).      Norleen Jobs, MD

## 2024-07-15 NOTE — Patient Instructions (Signed)
 Try [facility] or [facility]

## 2024-07-16 ENCOUNTER — Ambulatory Visit: Payer: Self-pay | Admitting: Family Medicine

## 2024-07-16 LAB — TSH: TSH: 0.498 u[IU]/mL (ref 0.450–4.500)

## 2024-07-16 LAB — LIPID PANEL
Chol/HDL Ratio: 2.8 ratio (ref 0.0–4.4)
Cholesterol, Total: 171 mg/dL (ref 100–199)
HDL: 61 mg/dL (ref >39)
LDL Chol Calc (NIH): 98 mg/dL (ref 0–99)
Triglycerides: 62 mg/dL (ref 0–149)
VLDL Cholesterol Cal: 12 mg/dL (ref 5–40)

## 2024-07-16 LAB — VITAMIN D 25 HYDROXY (VIT D DEFICIENCY, FRACTURES): Vit D, 25-Hydroxy: 52.6 ng/mL (ref 30.0–100.0)

## 2024-08-17 ENCOUNTER — Telehealth: Admitting: Family Medicine

## 2024-08-17 ENCOUNTER — Encounter: Payer: Self-pay | Admitting: Family Medicine

## 2024-08-17 VITALS — Ht 62.0 in | Wt 224.0 lb

## 2024-08-17 DIAGNOSIS — F341 Dysthymic disorder: Secondary | ICD-10-CM | POA: Diagnosis not present

## 2024-08-17 MED ORDER — VENLAFAXINE HCL ER 75 MG PO CP24: 75.0000 mg | ORAL_CAPSULE | Freq: Every day | ORAL | 1 refills | Status: DC

## 2024-08-17 NOTE — Progress Notes (Signed)
   Subjective:    Patient ID: Paula Cline, female    DOB: [birth year], 68 y.o.   MRN: [id number]  HPI Documentation for virtual audio and video telecommunications through Caregility encounter: I am in my office and she is at home The patient was located at home. 2 patient identifiers used.  The provider was located in the office. The patient did consent to this visit and is aware of possible charges through their insurance for this visit.  The other persons participating in this telemedicine service were none. Time spent on call was 5 minutes and in review of previous records >15 minutes total for counseling and coordination of care.  This virtual service is not related to other E/M service within previous 7 days.  She is here for follow-up concerning her Effexor .  She can definitely tell a difference and is very happy with the results of this.  She states that this occurred relatively quickly.  She would like to go the next higher dose.  Review of Systems     Objective:    Physical Exam Alert and in no distress with appropriate appearing affect.       Assessment & Plan:  Dysthymia - Plan: venlafaxine  XR (EFFEXOR  XR) 75 MG 24 hr capsule Set her up for another virtual visit in 1 month.

## 2024-08-25 DIAGNOSIS — M1711 Unilateral primary osteoarthritis, right knee: Secondary | ICD-10-CM | POA: Diagnosis not present

## 2024-09-09 ENCOUNTER — Other Ambulatory Visit: Payer: Self-pay | Admitting: Family Medicine

## 2024-09-09 DIAGNOSIS — F341 Dysthymic disorder: Secondary | ICD-10-CM

## 2024-09-09 NOTE — Telephone Encounter (Signed)
Dose was increased to 75mg 

## 2024-09-14 ENCOUNTER — Encounter: Payer: Self-pay | Admitting: Family Medicine

## 2024-09-14 ENCOUNTER — Telehealth (INDEPENDENT_AMBULATORY_CARE_PROVIDER_SITE_OTHER): Admitting: Family Medicine

## 2024-09-14 ENCOUNTER — Telehealth: Payer: Self-pay

## 2024-09-14 DIAGNOSIS — F341 Dysthymic disorder: Secondary | ICD-10-CM | POA: Diagnosis not present

## 2024-09-14 MED ORDER — VENLAFAXINE HCL ER 75 MG PO CP24: 75.0000 mg | ORAL_CAPSULE | Freq: Every day | ORAL | 1 refills | Status: AC

## 2024-09-14 NOTE — Telephone Encounter (Signed)
 Rx Clarification: Spoke with Patient and she confirmed that ibuprofen is working better for her, She is discontinuing Celebrex  today.

## 2024-09-14 NOTE — Progress Notes (Signed)
   Subjective:    Patient ID: Paula Cline, female    DOB: 09/15/1956, 68 y.o.   MRN: [id number]  HPI Documentation for virtual audio and video telecommunications through [place] encounter:  The patient was located at home. 2 patient identifiers used.  The provider was located in the office. The patient did consent to this visit and is aware of possible charges through their insurance for this visit.  The other persons participating in this telemedicine service were none. Time spent on call was 5 minutes and in review of previous records >15 minutes total for counseling and coordination of care.  This virtual service is not related to other E/M service within previous 7 days.  Today's visit is to discuss her medications.  She did have her venlafaxine  increased and states that she now is back to her normal self and wants to continue on that dosing regimen.  Review of Systems     Objective:    Physical Exam  Alert and in no distress with appropriate affect.      Assessment & Plan:  Dysthymia - Plan: venlafaxine  XR (EFFEXOR  XR) 75 MG 24 hr capsule I will continue her on her present dosing regimen and recheck in about 6 months.

## 2024-10-06 ENCOUNTER — Other Ambulatory Visit: Payer: Self-pay | Admitting: Family Medicine

## 2024-11-02 ENCOUNTER — Other Ambulatory Visit: Payer: Self-pay | Admitting: Family Medicine

## 2024-11-02 DIAGNOSIS — F419 Anxiety disorder, unspecified: Secondary | ICD-10-CM

## 2024-12-02 ENCOUNTER — Encounter: Payer: Self-pay | Admitting: Family Medicine

## 2024-12-16 ENCOUNTER — Other Ambulatory Visit: Payer: Self-pay | Admitting: Family Medicine

## 2024-12-16 DIAGNOSIS — I700 Atherosclerosis of aorta: Secondary | ICD-10-CM

## 2024-12-16 DIAGNOSIS — E785 Hyperlipidemia, unspecified: Secondary | ICD-10-CM

## 2024-12-16 DIAGNOSIS — I10 Essential (primary) hypertension: Secondary | ICD-10-CM

## 2025-01-04 ENCOUNTER — Ambulatory Visit (HOSPITAL_BASED_OUTPATIENT_CLINIC_OR_DEPARTMENT_OTHER): Admitting: Pulmonary Disease

## 2025-03-15 ENCOUNTER — Other Ambulatory Visit (HOSPITAL_BASED_OUTPATIENT_CLINIC_OR_DEPARTMENT_OTHER)

## 2025-03-29 ENCOUNTER — Ambulatory Visit

## 2025-07-15 ENCOUNTER — Encounter: Payer: Self-pay | Admitting: Family Medicine
